# Patient Record
Sex: Female | Born: 1960 | Race: White | Hispanic: No | Smoking: Former smoker
Health system: Southern US, Community
[De-identification: ages and names within clinical notes are randomized; demographics above are authoritative.]

## PROBLEM LIST (undated history)

## (undated) DIAGNOSIS — N201 Calculus of ureter: Secondary | ICD-10-CM

## (undated) DIAGNOSIS — E785 Hyperlipidemia, unspecified: Secondary | ICD-10-CM

## (undated) DIAGNOSIS — Z8619 Personal history of other infectious and parasitic diseases: Secondary | ICD-10-CM

## (undated) DIAGNOSIS — Z8679 Personal history of other diseases of the circulatory system: Secondary | ICD-10-CM

## (undated) DIAGNOSIS — I10 Essential (primary) hypertension: Secondary | ICD-10-CM

## (undated) DIAGNOSIS — F329 Major depressive disorder, single episode, unspecified: Secondary | ICD-10-CM

## (undated) DIAGNOSIS — Z9889 Other specified postprocedural states: Secondary | ICD-10-CM

## (undated) DIAGNOSIS — R112 Nausea with vomiting, unspecified: Secondary | ICD-10-CM

## (undated) DIAGNOSIS — K5900 Constipation, unspecified: Secondary | ICD-10-CM

## (undated) DIAGNOSIS — Z8742 Personal history of other diseases of the female genital tract: Secondary | ICD-10-CM

## (undated) DIAGNOSIS — M5030 Other cervical disc degeneration, unspecified cervical region: Secondary | ICD-10-CM

## (undated) DIAGNOSIS — F419 Anxiety disorder, unspecified: Secondary | ICD-10-CM

## (undated) DIAGNOSIS — I609 Nontraumatic subarachnoid hemorrhage, unspecified: Secondary | ICD-10-CM

## (undated) DIAGNOSIS — M1990 Unspecified osteoarthritis, unspecified site: Secondary | ICD-10-CM

## (undated) DIAGNOSIS — K219 Gastro-esophageal reflux disease without esophagitis: Secondary | ICD-10-CM

## (undated) DIAGNOSIS — R7303 Prediabetes: Secondary | ICD-10-CM

## (undated) DIAGNOSIS — Z87442 Personal history of urinary calculi: Secondary | ICD-10-CM

## (undated) DIAGNOSIS — Z8249 Family history of ischemic heart disease and other diseases of the circulatory system: Secondary | ICD-10-CM

## (undated) DIAGNOSIS — M5020 Other cervical disc displacement, unspecified cervical region: Secondary | ICD-10-CM

## (undated) DIAGNOSIS — F32A Depression, unspecified: Secondary | ICD-10-CM

## (undated) HISTORY — DX: Depression, unspecified: F32.A

## (undated) HISTORY — PX: CARDIOVASCULAR STRESS TEST: SHX262

## (undated) HISTORY — DX: Hyperlipidemia, unspecified: E78.5

## (undated) HISTORY — PX: ORIF ANKLE FRACTURE: SUR919

## (undated) HISTORY — DX: Nontraumatic subarachnoid hemorrhage, unspecified: I60.9

## (undated) HISTORY — DX: Gastro-esophageal reflux disease without esophagitis: K21.9

## (undated) HISTORY — DX: Anxiety disorder, unspecified: F41.9

## (undated) HISTORY — PX: CARPAL TUNNEL RELEASE: SHX101

## (undated) HISTORY — DX: Personal history of other diseases of the circulatory system: Z86.79

## (undated) HISTORY — DX: Other cervical disc degeneration, unspecified cervical region: M50.30

## (undated) HISTORY — DX: Major depressive disorder, single episode, unspecified: F32.9

---

## 1987-05-11 HISTORY — PX: TUBAL LIGATION: SHX77

## 1997-05-10 HISTORY — PX: CHOLECYSTECTOMY: SHX55

## 1999-05-11 HISTORY — PX: OTHER SURGICAL HISTORY: SHX169

## 2002-05-22 ENCOUNTER — Inpatient Hospital Stay (HOSPITAL_COMMUNITY): Admission: RE | Admit: 2002-05-22 | Discharge: 2002-05-24 | Payer: Self-pay | Admitting: Gynecology

## 2002-05-22 ENCOUNTER — Encounter (INDEPENDENT_AMBULATORY_CARE_PROVIDER_SITE_OTHER): Payer: Self-pay | Admitting: Specialist

## 2002-05-22 HISTORY — PX: TOTAL ABDOMINAL HYSTERECTOMY: SHX209

## 2005-06-17 ENCOUNTER — Ambulatory Visit (HOSPITAL_COMMUNITY): Admission: RE | Admit: 2005-06-17 | Discharge: 2005-06-17 | Payer: Self-pay | Admitting: Family Medicine

## 2005-06-29 ENCOUNTER — Ambulatory Visit (HOSPITAL_COMMUNITY): Admission: RE | Admit: 2005-06-29 | Discharge: 2005-06-29 | Payer: Self-pay | Admitting: Family Medicine

## 2006-06-17 ENCOUNTER — Ambulatory Visit (HOSPITAL_COMMUNITY): Admission: RE | Admit: 2006-06-17 | Discharge: 2006-06-17 | Payer: Self-pay | Admitting: Family Medicine

## 2006-08-18 ENCOUNTER — Ambulatory Visit: Payer: Self-pay | Admitting: Internal Medicine

## 2006-08-24 ENCOUNTER — Ambulatory Visit (HOSPITAL_COMMUNITY): Admission: RE | Admit: 2006-08-24 | Discharge: 2006-08-24 | Payer: Self-pay | Admitting: Obstetrics & Gynecology

## 2006-08-24 ENCOUNTER — Encounter (INDEPENDENT_AMBULATORY_CARE_PROVIDER_SITE_OTHER): Payer: Self-pay | Admitting: *Deleted

## 2006-08-24 HISTORY — PX: LAPAROSCOPIC SALPINGO OOPHERECTOMY: SHX5927

## 2007-06-17 ENCOUNTER — Emergency Department (HOSPITAL_COMMUNITY): Admission: EM | Admit: 2007-06-17 | Discharge: 2007-06-17 | Payer: Self-pay | Admitting: Emergency Medicine

## 2008-01-27 ENCOUNTER — Emergency Department (HOSPITAL_COMMUNITY): Admission: EM | Admit: 2008-01-27 | Discharge: 2008-01-27 | Payer: Self-pay | Admitting: Emergency Medicine

## 2008-08-10 ENCOUNTER — Emergency Department (HOSPITAL_COMMUNITY): Admission: EM | Admit: 2008-08-10 | Discharge: 2008-08-10 | Payer: Self-pay | Admitting: Emergency Medicine

## 2008-09-06 ENCOUNTER — Ambulatory Visit (HOSPITAL_COMMUNITY)
Admission: RE | Admit: 2008-09-06 | Discharge: 2008-09-06 | Payer: Self-pay | Admitting: Physical Medicine and Rehabilitation

## 2008-09-17 ENCOUNTER — Ambulatory Visit (HOSPITAL_COMMUNITY): Admission: RE | Admit: 2008-09-17 | Discharge: 2008-09-17 | Payer: Self-pay | Admitting: Urology

## 2008-09-21 ENCOUNTER — Emergency Department (HOSPITAL_COMMUNITY): Admission: EM | Admit: 2008-09-21 | Discharge: 2008-09-22 | Payer: Self-pay | Admitting: Emergency Medicine

## 2009-04-12 ENCOUNTER — Emergency Department (HOSPITAL_COMMUNITY): Admission: EM | Admit: 2009-04-12 | Discharge: 2009-04-12 | Payer: Self-pay | Admitting: Emergency Medicine

## 2009-08-15 ENCOUNTER — Ambulatory Visit (HOSPITAL_COMMUNITY): Admission: RE | Admit: 2009-08-15 | Discharge: 2009-08-15 | Payer: Self-pay | Admitting: Family Medicine

## 2010-03-15 ENCOUNTER — Emergency Department (HOSPITAL_COMMUNITY)
Admission: EM | Admit: 2010-03-15 | Discharge: 2010-03-15 | Payer: Self-pay | Source: Home / Self Care | Admitting: Emergency Medicine

## 2010-03-30 ENCOUNTER — Encounter: Payer: Self-pay | Admitting: Orthopedic Surgery

## 2010-06-09 NOTE — Letter (Signed)
Summary: [facility] Ref[facility] No Show  [facility]  [address]  [place], [place] [address]   Phone: [phone]  Fax: [phone]     March 30, 2010  [facility] Dr. Hardie Lora, [facility] Fax:  [phone]   Re:    Katheline L LAWS DOB:    06-08-1960    The above named patient was a no show for the scheduled appointment:  Date:  03/25/10 Time:   3:00pm  Thank you for your kind referral.  Please feel free to contact our office if we can be of further service.  Sincerely,   [facility] Office of Dr. Terrance Mass, MD

## 2010-08-11 LAB — URINALYSIS, ROUTINE W REFLEX MICROSCOPIC
Bilirubin Urine: NEGATIVE
Glucose, UA: NEGATIVE mg/dL
Hgb urine dipstick: NEGATIVE
Ketones, ur: NEGATIVE mg/dL
Nitrite: NEGATIVE
Protein, ur: NEGATIVE mg/dL
Specific Gravity, Urine: 1.01 (ref 1.005–1.030)
Urobilinogen, UA: 0.2 mg/dL (ref 0.0–1.0)
pH: 7.5 (ref 5.0–8.0)

## 2010-08-11 LAB — WET PREP, GENITAL: Yeast Wet Prep HPF POC: NONE SEEN

## 2010-08-11 LAB — URINE MICROSCOPIC-ADD ON

## 2010-08-11 LAB — GC/CHLAMYDIA PROBE AMP, GENITAL
Chlamydia, DNA Probe: NEGATIVE
GC Probe Amp, Genital: NEGATIVE

## 2010-08-19 LAB — URINALYSIS, ROUTINE W REFLEX MICROSCOPIC
Bilirubin Urine: NEGATIVE
Glucose, UA: NEGATIVE mg/dL
Hgb urine dipstick: NEGATIVE
Ketones, ur: NEGATIVE mg/dL
Nitrite: NEGATIVE
Protein, ur: NEGATIVE mg/dL
Specific Gravity, Urine: 1.02 (ref 1.005–1.030)
Urobilinogen, UA: 0.2 mg/dL (ref 0.0–1.0)
pH: 7 (ref 5.0–8.0)

## 2010-08-19 LAB — URINE MICROSCOPIC-ADD ON

## 2010-09-25 NOTE — Op Note (Signed)
NAME:  Sabrina Beasley, Sabrina Beasley                             ACCOUNT NO.:  [id number]   MEDICAL RECORD NO.:  [id number]                   PATIENT TYPE:  INP   LOCATION:  9399                                 FACILITY:  [facility]   PHYSICIAN:  Ivor Costa. Farrel Gobble, M.D.              DATE OF BIRTH:  December 21, 1960   DATE OF PROCEDURE:  05/22/2002  DATE OF DISCHARGE:                                 OPERATIVE REPORT   PREOPERATIVE DIAGNOSES:  1. Chronic pelvic pain.  2. Complex adnexal mass.  3. History of endometriosis.  4. Dysfunctional bleeding.   POSTOPERATIVE DIAGNOSES:  1. Chronic pelvic pain.  2. Complex adnexal mass.  3. History of endometriosis.  4. Dysfunctional bleeding.   PROCEDURE:  1. Total abdominal hysterectomy.  2. Left salpingo oophorectomy.   SURGEON:  Ivor Costa. Farrel Gobble, M.D.   ASSISTANT:  Gaetano Hawthorne. Lily Peer, M.D.   ANESTHESIA:  Epidural with IV sedation.   IV FLUIDS:  1600 cubic centimeters of lactated Ringer's.   URINE OUTPUT:  125 cubic centimeters clear.   ESTIMATED BLOOD LOSS:  100 cubic centimeters.   FINDINGS:  Left ovarian mass that was mobile.  Normal uterus, tubes, and  right ovary.  No evidence of peritoneal endometriosis grossly.   PATHOLOGY:  Uterus, cervix, and left tube and ovary.   COMPLICATIONS:  None.   PROCEDURE:  The patient was taken to the operating room.  Epidural  anesthesia was induced.  Placed in the supine position.  Prepped and draped  in the usual sterile fashion.  After adequate anesthesia was insured, a  Pfannenstiel skin incision was made with a scalpel and carried through to  the underlying layer of fascia with electrocautery.  The fascia was scored  in the midline.  The incision was extended laterally with Mayo scissors.  The superior aspect of the fascial incision was grasped with Kochers.  The  underlying rectus muscles were dissected off by blunt and sharp dissection.  In a similar fashion the inferior aspect of the incision was grasped  with  Kochers.  The underlying rectus muscles were dissected off.  The rectus  muscles were separated in the midline.  The peritoneum was identified and  entered bluntly.  The peritoneal incision was then extended superiorly and  inferiorly with good visualization of underlying bowel and bladder.  The  orientation of the uterus confirmed was confirmed.  The adnexa was noted to  be mobile.  An O'Connor-O-Sullivan retractor was placed in the incision.  The bowel was packed away with moist lap packs x2.  The adnexa were grasped  with Heaney clamps.  The left round ligament was transected and suture  ligated with 0 Vicryl.  The anterior leaf of the broad ligament was incised  to the midline to create a bladder flap.  The posterior leaf was also  incised.  The IP was identified.  The posterior leaf was bluntly  entered.  The IP was then transected and suture ligated after a free tie of 0 Vicryl  was placed.  The adnexa was passed off the table.  The uterine vessels were  then skeletonized, transected, and suture ligated x2 with 0 Vicryl.  Attention was then turned to the right side.  In a similar fashion the round  ligament was grasped, transected, and suture ligated with 0 Vicryl.  The  anterior leaf of the broad ligament was incised.  The bladder flap was noted  to fall naturally.  The posterior leaf was also incised.  The tubo-ovarian  ligament then on the left was transected and suture ligated with a free tie  of 0 Vicryl and suture ligature.  Similarly, the vessels were skeletonized,  transected, and suture ligated.  The dissection was carried to the cardinal  ligaments, again with 0 Vicryl, down to the cervix.  The vagina was entered  sharply and the whole specimen was passed off sharply.  The adnexa were then  transfixed to the ipsilateral cardinal ligament, inverting the mucosa.  The  remainder of the cuff was then closed with a baseball stitch of 0 Vicryl.  The pelvis was then irrigated  with copious amounts of warm saline.  Hemostasis was assured.  The tubo-ovarian ligament on the right was  transfixed to the round ligament and were already elevated out of the cul-de-  sac.  The small rent in the peritoneum was then closed with 3-0 Vicryl.  The  sponges and retractor were then removed from the incision.  Inspection of  the peritoneum muscles were noted to be hemostatic.  The fascia was closed  with 0 Vicryl starting at the apex heading towards the midline bilaterally.  The subcutaneous was irrigated.  The skin was closed with staples.  Sponge,  lap, needle counts were correct x2.  She was transferred to the PACU in  stable condition.  She received Ancef intraoperatively.                                               Ivor Costa. Farrel Gobble, M.D.    THL/MEDQ  D:  05/22/2002  T:  05/22/2002  Job:  [id number]

## 2010-09-25 NOTE — H&P (Signed)
NAME:  Sabrina Beasley, Sabrina Beasley                   ACCOUNT NO.:  [id number]   MEDICAL RECORD NO.:  [id number]          PATIENT TYPE:  AMB   LOCATION:  DAY                           FACILITY:  [facility]   PHYSICIAN:  R. Roetta Sessions, M.D. DATE OF BIRTH:  1961/02/20   DATE OF ADMISSION:  DATE OF DISCHARGE:  LH                              HISTORY & PHYSICAL   REASON FOR CONSULT:  Constipation, hematochezia.   HISTORY OF PRESENT ILLNESS:  The patient is a pleasant 50 year old  Caucasian female sent over at the courtesy of Dr. Nobie Putnam to further  evaluate constipation.  The patient has been constipated for a 2 plus  years.  She may go 3 to 4 days without a bowel movement.  He is taking  Dulcolax tablets and MiraLax, other over-the-counter agents.  She is  struggling to have a bowel movement during this time.  She may go 3 to 4  days without a bowel movement, and just have vague abdominal bloating  along the way.   Just recently she was started on Amitiza 24 mcg b.i.d., and this was  associated with severe abdominal cramps and diarrhea.  She had been  taking one tablet daily with food, it was too much, and it produced  diarrhea and some incontinent episodes.  She has not lost any weight.  She tells me that she has had her thyroid status check within the past 2  years, and it was okay.  There is no family history of colorectal  neoplasia.  This lady does relate to having intermittent episodes of  bright red blood per rectum.  She has had some right lower quadrant  abdominal pain and was found to have a complex hemorrhagic cyst of the  right ovary, it has been recurrent, and she is actually slated to have a  laparoscopic right oophorectomy by Dr. Despina Hidden next week.   This lady has a history of heartburn, for which she takes omeprazole 20  mg orally daily.  This has been effective in squelching her reflux  symptoms.   PAST MEDICAL HISTORY:  Past medical history is significant for  hypercholesterolemia.  She  is status post hysterectomy and left over  rectum he previously, with history of recurrent right ovarian cysts.   CURRENT MEDICATIONS:  1. Omeprazole 20 mg orally daily.  2. Zocor 10 mg daily.  3. Voltaren 75 mg b.i.d.  4. Multivitamin daily.  5. Paxil 40 mg daily.  6. Trazodone 50 mg daily.  7. All-Bran fiber 10 mg daily.   ALLERGIES:  SULFA.   FAMILY HISTORY:  Mother is alive with hypertension,  hypercholesterolemia, and reflux.  The patient had succumbed at the age  of 67 with complications related to emphysema and pneumonia.  No history  of chronic GI or liver disease in the family otherwise.   SOCIAL HISTORY:  The patient is married and has 3 children.  [place]  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  No tobacco.  No alcohol.  She has no chest  pain,  no dyspnea on exertion, no change in weight, no odynophagia, no  dysphagia, no nausea or vomiting.  No fever or chills.   PHYSICAL EXAMINATION:  GENERAL:  Reveals a pleasant 50 year old lady  resting comfortably.  VITAL SIGNS:  Weight 157, height 4 feet 11 inches, temperature 98.5, BP  110/80, pulse 76.  SKIN:  Warm and dry.  There is no jaundice or evidence of any chronic  liver disease.  HEENT EXAM:  No scleral icterus.  Conjunctivae are pink.  The oral  cavity had no lesions.  CHEST:  Lungs are clear to auscultation.  CARDIAC EXAM:  Regular rate and rhythm, without murmur, gallop, or rub.  ABDOMEN:  Nondistended.  Positive bowel sounds.  Soft, nontender.  No  appreciable mass or organomegaly.  RECTAL EXAM:  Deferred until time of colonoscopy.   LABORATORY DATA:  Laboratory data done back in 01/2006, she had a  slightly elevated white count of 10.6, H and H of 12.2 and 36.8.  Serum  iron 73.  Iron-binding capacity 383, with iron saturations at point they  were normal at 19.  B12 level is 609, folate level greater than 20.   IMPRESSION:  The patient is a very pleasant  50 year old lady with  chronic constipation, it sounds more like colonic inertia than a  functional outlet obstruction.  She does have intermittent hematochezia,  which will demand further evaluation.  He has been on a number of agents  to combat constipation.  More recently, it sounds like the Amitiza was  effective and, in fact, use of this agent actually overshot our end  point, producing incontinence and diarrhea.  Unfortunately, the 24-mcg  gelcap is the only form in which Amitiza comes.  The dosage cannot be  reduced on the patient end of things.   RECOMMENDATIONS:  1. This patient needs to have a colonoscopy.  I feel it would be okay      for her to wait until after she has her right ovary resected.  In      the meantime, we will go ahead and try some CrystaLase 20 grams      orally at bedtime, samples given.  If this happens to produced      diarrhea, I have asked the patient to cut back to 10 grams orally      at bedtime.  She is to continue a fiber supplement.  2. Literature on constipation was provided to the patient.  3. Further recommendations to follow.      Jonathon Bellows, M.D.  Electronically Signed     RMR/MEDQ  D:  08/18/2006  T:  08/18/2006  Job:  [id number]   cc:   Lazaro Arms, M.D.  Fax: [phone]   Patrica Duel, M.D.  Fax: [phone]

## 2010-09-25 NOTE — Discharge Summary (Signed)
   NAME:  Sabrina Beasley, Sabrina Beasley                             ACCOUNT NO.:  [id number]   MEDICAL RECORD NO.:  [id number]                   PATIENT TYPE:  INP   LOCATION:  9314                                 FACILITY:  [facility]   PHYSICIAN:  Ivor Costa. Farrel Gobble, M.D.              DATE OF BIRTH:  25-May-1960   DATE OF ADMISSION:  05/22/2002  DATE OF DISCHARGE:  05/24/2002                                 DISCHARGE SUMMARY   DISCHARGE DIAGNOSES:  1. Pelvic pain.  2. Complex adnexal mass.   PREOPERATIVE DIAGNOSIS:  1. History of endometriosis.  2. Dysfunctional uterine bleeding.   POSTOPERATIVE DIAGNOSES:  1. Total abdominal hysterectomy.  2. Left salpingo-oophorectomy with Ivor Costa. Farrel Gobble, M.D. on May 22, 2002.  3. Left ovarian mucinous cyst adenoma.   HISTORY:  This is a 50 year old female gravida 2, para 3, with history of  endometriosis diagnosed by laparoscopy in 2001.  Also, D&C done at that time  for menorrhagia.  The patient had low back pain on a regular basis,  exacerbation of her symptoms at the time of her period.  Periods were every  2-4 weeks with 3-4 days of heavy flow which she _________ every two hours  for three days with large amount of clotting, severe dysmenorrhea.  She had  on ultrasound echogenic cyst measuring  5.6 x 4.6 x 4.7 cm and patient desired definitive therapy.   HOSPITAL COURSE:  On May 22, 2002, the patient was admitted to undergo a  total abdominal hysterectomy, left salpingo-oophorectomy with Ivor Costa.  Lathrop, M.D., left ovarian mass that was mobile and removed.  The pathology  revealed it to be an ovarian mucinous cyst adenoma.  Postoperatively on  May 22, 2002, the patient did have temperature of 101.1 at 2030.  After  this, the patient was afebrile, doing well, desired to go home on May 24, 2002, and was felt in satisfactory condition so she was discharged to  home.  __________ labs on May 23, 2002, hemoglobin 10.2.   DISPOSITION:   The patient discharged to home.  She is to follow up in the  office in two weeks.  If any problems prior to that time, to be seen in the  office.     Susa Loffler, P.A.                    Ivor Costa. Farrel Gobble, M.D.    TSG/MEDQ  D:  08/10/2002  T:  08/11/2002  Job:  [id number]

## 2010-09-25 NOTE — H&P (Signed)
NAME:  LAWS, Sabrina Beasley                   ACCOUNT NO.:  [id number]   MEDICAL RECORD NO.:  [id number]          PATIENT TYPE:  AMB   LOCATION:                                FACILITY:  [facility]   PHYSICIAN:  Lazaro Arms, M.D.   DATE OF BIRTH:  [birth year]   DATE OF ADMISSION:  DATE OF DISCHARGE:  LH                              HISTORY & PHYSICAL   Sabrina Beasley is 50 year old white female status post a hysterectomy and left  salpingo-oophorectomy in the past who has been having on again and off  again pain in the right lower quadrant for about 2 years.  She saw  [facility] and got an ultrasound [facility] at [facility]  which revealed a 3 cm hemorrhagic corpus luteum cyst.  The patient  understands that this is a hormonal cyst, but states that she has been  having on again and off again pain for 2 years and wants it finally  dealt with.  I offered her repeat ultrasound in the future, and she  wanted definitive management.  As a result, she will have a laparoscopic  RSO on the 16th.   PAST MEDICAL HISTORY:  1. Hypercholesterolemia.  2. Gastroesophageal reflux.  3. Osteoarthritis.  4. Depression.   PAST SURGICAL HISTORY:  1. She had a D&C in 1999.  2. Hysterectomy and left salpingo-oophorectomy in 2004.   REVIEW OF SYSTEMS:  As per HPI, otherwise negative.   PAST OBSTETRICAL HISTORY:  Three vaginal deliveries.   ALLERGIES:  SULFA DRUGS.   MEDICATIONS:  Omeprazole 20 mg a day, Zocor 10 mg a day, diclofenac 75  mg a day, Paxil 40 mg a day, trazodone 150 mg a day.   VITAL SIGNS:  Weight is 156 pounds, blood pressure is 120/88.  HEENT:  Unremarkable.  NECK:  Thyroid is normal.  LUNGS:  Clear.  HEART:  Regular rate and rhythm.  No murmurs, rubs or gallops.  BREASTS:  Without mass, discharge, skin changes.  ABDOMEN:  Benign.  No hepatosplenomegaly or masses.  Tender right lower  quadrant.  No rebound.  PELVIC:  Negative.  So much central obesity to really feel the right  ovary.  EXTREMITIES:  Normal with no edema.  NEUROLOGIC:  Neurological exam is grossly intact.   IMPRESSION:  1. Right lower quadrant pain for 2 years.  2. Right hemorrhagic corpus luteum cyst.   PLAN:  Patient understands that this may or may not be related to her  pain at all times.  She understands that, while in fact she may have a  corpus luteum, this may not explain her pain.  She understands and wants  to proceed with laparoscopic RSO.  She understands the risks, benefits,  indication, alternatives.  We will proceed.      Lazaro Arms, M.D.  Electronically Signed     LHE/MEDQ  D:  08/18/2006  T:  08/18/2006  Job:  [id number]

## 2010-09-25 NOTE — Op Note (Signed)
NAME:  Sabrina Beasley, Sabrina Beasley                   ACCOUNT NO.:  [id number]   MEDICAL RECORD NO.:  [id number]          PATIENT TYPE:  AMB   LOCATION:  DAY                           FACILITY:  [facility]   PHYSICIAN:  Lazaro Arms, M.D.   DATE OF BIRTH:  May 11, 1960   DATE OF PROCEDURE:  08/24/2006  DATE OF DISCHARGE:                               OPERATIVE REPORT   PREOPERATIVE DIAGNOSES:  1. Right ovarian cyst, persistent.  2. Right lower quadrant pain.   POSTOPERATIVE DIAGNOSES:  1. Right ovarian cyst, persistent.  2. Right lower quadrant pain.   PROCEDURE:  Laparoscopic right salpingo-oophorectomy.   SURGEON:  Lazaro Arms, M.D.   ANESTHESIA:  General.   FINDINGS:  The patient had enlarged right tube and ovary.  It was kind  of just in a big mass adherent to the right pelvic sidewall.  Certainly  was benign.  Pelvis was otherwise normal.   DESCRIPTION OF OPERATION:  The patient was taken to the operating room  and placed in the supine position where she underwent general  endotracheal anesthesia, placed in dorsal lithotomy position, prepped  and draped in the usual sterile fashion.  Incision was made in the  umbilicus and an open laparoscopy was performed.  The fascia was incised  directly and the peritoneum was entered manually.  The sleeve of the  trocar was placed with video laparoscope.  It was insufflated.  5 mm  trocars were placed two fingerbreadths above the pubis in the midline  and then also the right lower quadrant under direct visualization  without difficulty.  The ovary was grasped.  Harmonic scalpel was used.  Infundibulopelvic ligament was cauterized and the ovary was carefully  dissected off of the wall using the harmonic scalpel.  There was really  no bleeding at all during the procedure.  The ovary was removed in the  EndoCatch bag.  The pelvis was irrigated vigorously.  All pedicles were  hemostatic.  The instruments were removed.  Gas allowed to escape.  The  umbilical  fascia was closed and the skin was closed using skin staples  x3.  The patient tolerated the procedure well.  She experienced minimal  blood loss, taken to the recovery room in good stable condition.  All  counts correct.     Lazaro Arms, M.D.  Electronically Signed    LHE/MEDQ  D:  08/24/2006  T:  08/24/2006  Job:  [id number]

## 2010-09-25 NOTE — H&P (Signed)
NAME:  Sabrina Beasley, Sabrina Beasley                             ACCOUNT NO.:  [id number]   MEDICAL RECORD NO.:  [id number]                   PATIENT TYPE:  INP   LOCATION:  NA                                   [facility]ACILITY[facility]   PHYSICIAN:  Ivor Costa. Farrel Gobble, M.D.              DATE OF BIRTH:  05-19-1960   DATE OF ADMISSION:  DATE OF DISCHARGE:                                HISTORY & PHYSICAL   CHIEF COMPLAINT:  Menorrhagia, dyspareunia, suspect endometriosis.   HISTORY OF PRESENT ILLNESS:  The patient is a 50 year old, G3, P3, with a  history of endometriosis diagnosed by laparoscopy in 2001.  She also had a  D&C done at that time for menorrhagia.  The patient reports having low back  pain on a daily basis with exacerbation of her symptoms at the time of her  period.  She reports that her periods are every 3-4 weeks with 3-4 days of  heavy flow and which she changes tampon approximately every two hours for  three days with large amounts of clots.  She has severe dysmenorrhea for  which she takes Motrin every 4 hours and Midol on a regular basis for three  days.  She also reports dyspareunia which affects her relationship with her  husband.  She has had these symptoms for approximately four years.  She had  no improvement after going to the OR.  She was placed on Lupron but failed  to follow up secondary to insurance change.  The patient presented then back  to the office for an ultrasound with a sonohysterogram.  Her left adnexa  showed an echogenic cyst that measured 5.6 x 4.6 x 4.7 mm.  Her  sonohysterogram was unremarkable.   PAST OBSTETRICAL/GYNECOLOGIC HISTORY:  Significant for three spontaneous  deliveries.  Her menses are as above.   PAST MEDICAL HISTORY:  Negative.   PAST SURGICAL HISTORY:  Significant for the laparoscopy in 2001.  She also  had a bilateral tubal ligation in 1989, and she had treatment of gallstones  in 1999.   MEDICATIONS:  Prozac and danazol which she had  discontinued.   ALLERGIES:  SULFA which causes nausea and vomiting.   SOCIAL HISTORY:  She is married, denies any alcohol, tobacco, or caffeine.  She walks on a regular basis.   PHYSICAL EXAMINATION:  GENERAL:  She is a well-appearing female in no acute  distress.  HEENT:  Unremarkable.  Thyroid is smooth, nontender, and mobile.  HEART:  Regular rate.  LUNGS:  Clear to auscultation.  BREASTS:  Without mass, discharge, or retractions supine and upright  position.  ABDOMEN:  Soft and nontender.  GYN:  She has normal external female genitalia.  The BUS is negative.  The  vagina is pink and moist.  Her cervix is parous without lesions.  BIMANUAL:  There is minimal cervical motion tenderness.  The uterus is  tender  anteriorly.  The adnexa are negative.  The rectovaginal exam does not  show any nodularity or tenderness of the uterosacral ligaments.  There is  minimal descent in the upright position.   ASSESSMENT:  History of endometriosis by prior laparoscope with a complex  adnexal mass and severe dysmenorrhea as well as dyspareunia.  The patient  presents for a total abdominal hysterectomy, possible bilateral salpingo-  oophorectomy.  If the adnexa are normal, i.e., the cyst is a hemorrhagic  cyst, we will consider keeping the adnexa.  If there is severe  endometriosis, she would prefer to have both ovaries removed, even the  normal-appearing right ovary.  All questions were addressed.                                               Ivor Costa. Farrel Gobble, M.D.    THL/MEDQ  D:  05/21/2002  T:  05/21/2002  Job:  [id number]

## 2010-11-03 ENCOUNTER — Encounter: Payer: Self-pay | Admitting: Vascular Surgery

## 2010-11-04 NOTE — Progress Notes (Signed)
[facility] [place]  New Carotid Patient  Referred by: Dr. Aldean Baker  Reason for referral: R wrist insufficiency  History of Present Illness  Sabrina Beasley is a 50 y.o. female who presents with chief complaint: B hand numbness.  No traumatic history.  She denies repetitive stress trauma from her occupation.  Her distribution of his sx is c/w ulnar distribution.  She denies any pain per se and denies any neck or shoulder sx.  She is not clear if she has already undergone BUE EMG/NCV though the chart suggests she has undergone such.  She has undergone chest xray and MRI.  Past Medical History  Diagnosis Date  . Diabetes mellitus   . Gout   . GERD (gastroesophageal reflux disease)   . Anxiety   . Arthritis   . Cataract   . Depression   . Hepatitis C   . Osteoporosis   . History of rheumatic fever   . DDD (degenerative disc disease), cervical   . Hyperlipidemia     Past Surgical History  Procedure Date  . Cholecystectomy   . Abdominal hysterectomy   . Carpal tunnel release     bilateral    History   Social History  . Marital Status: Married    Spouse Name: N/A    Number of Children: N/A  . Years of Education: N/A   Occupational History  . Not on file.   Social History Main Topics  . Smoking status: Never Smoker   . Smokeless tobacco: Not on file  . Alcohol Use:   . Drug Use:   . Sexually Active:    Other Topics Concern  . Not on file   Social History Narrative  . No narrative on file    Family History  Problem Relation Age of Onset  . Hypertension Mother   . Hyperlipidemia Mother   . Heart disease Father     Current outpatient prescriptions:allopurinol (ZYLOPRIM) 300 MG tablet, Take 300 mg by mouth daily.  , Disp: , Rfl: ;  Cholecalciferol (VITAMIN D PO), Take 5,000 Units by mouth once a week.  , Disp: , Rfl: ;  citalopram (CELEXA) 40 MG tablet, Take 40 mg by mouth daily.  , Disp: , Rfl: ;  fenofibrate 160 MG tablet, Take 160 mg by  mouth daily.  , Disp: , Rfl:  metFORMIN (GLUCOPHAGE) 500 MG tablet, Take 500 mg by mouth 2 (two) times daily with a meal.  , Disp: , Rfl: ;  metoprolol tartrate (LOPRESSOR) 25 MG tablet, Take 25 mg by mouth daily.  , Disp: , Rfl: ;  simvastatin (ZOCOR) 40 MG tablet, Take 40 mg by mouth at bedtime.  , Disp: , Rfl: ;  traZODone (DESYREL) 150 MG tablet, Take 150 mg by mouth at bedtime.  , Disp: , Rfl:   Allergies as of 11/06/2010 - never reviewed  Allergen Reaction Noted  . Macrobid  11/03/2010    Review of Systems (Positive items in bold and italic, otherwise negative)  General: Weight loss, Weight gain, Loss of appetite, Fever  Neurologic: Dizziness, Blackouts, Headaches, Seizure  Ear/Nose/Throat: Change in eyesight, Change in hearing, Nose bleeds, Sore throat  Vascular: Pain in legs with walking, Pain in feet while lying flat, Non-healing ulcer, Stroke, "Mini stroke", Slurred speech, Temporary blindness, Blood clot in vein, Phlebitis  Pulmonary: Home oxygen, Productive cough, Bronchitis, Coughing up blood, Asthma, Wheezing  Musculoskeletal: Arthritis, Joint pain, Muscle pain  Cardiac: Chest pain, Chest tightness/pressure, Shortness of breath  when lying flat, Palpitations, Heart murmur, Arrythmia, Atrial fibrillation  Hematologic: Bleeding problems, Clotting disorder, Anemia  Psychiatric:  Depression, Anxiety, Attention deficit disorder  Gastrointestinal:  Black stool, Blood in stool, Peptic ulcer disease, Reflux, Hiatal hernia, Trouble swallowing, Diarrhea, Constipation  Urinary:  Kidney disease, Burning with urination, Frequent urination, Difficulty urinating  Skin: Ulcers, Rashes  Physical Examination  Filed Vitals:   11/06/10 1137  BP: 102/69  Pulse: 86  Resp: 16    General: A&O x 3, WDWN**,* Cachectic / Ill appear / Somulent  Head: [place]/AT  Ear/Nose/Throat: Hearing grossly intact, nares w/o erythema or drainage, oropharynx w/o Erythema/Exudate  Eyes: PERRLA,  EOMI  Neck: Supple, no nuchal rigidity, no palpable LAD, no TTP  Pulmonary: Sym exp, good air movt, CTAB, no rales, rhonchi, & wheezing, no bruits over clavicles  Cardiac: RRR, Nl S1, S2, no Murmurs, rubs or gallops  Vascular: Vessel Right Left  Radial Palpable Palpable  Ulnary Palpable Palpable  Brachial Palpable Palpable  Carotid Palpable, without bruit Palpable, without bruit  Aorta Non-palpable N/A  Femoral Palpable Palpable  Popliteal Non-palpable Non-palpable  PT Palpable Palpable  DP Palpable Palpable   Gastrointestinal: soft, NTND, -G/R, - HSM, - masses, - CVAT B, mildly obese  Musculoskeletal: M/S 5/5 throughout, Extremities without ischemic changes  Neurologic: CN 2-12 intact, Pain and light touch intact in extremities, Motor exam as listed above, + EAST  Psychiatric: Judgment intact, Mood & affect appropriate for pt's clinical situation  Dermatologic: See M/S exam for extremity exam, no rashes otherwise noted  Lymph : No Cervical, Axillary, or Inguinal lymphadenopathy   Non-Invasive Vascular Imaging  BUE Arterial Duplex (Date: 11/06/10):   RUE: WBI 1.25, triphasic waveforms throughout  LUE: WBI 1.21, triphasic waveforms throughout  Outside Studies/Documentation 5 pages of outside documents were reviewed.  Medical Decision Making  Sabrina Beasley is a 50 y.o. female who presents with: BUE numbness.  Her work up so appears to be non-diagnostic including: CXR to r/o cervical ribs, MRI C spine to evaluate HNP causing cervical compression, B carpal tunnel release, and neurologic studies including nerve conduction studies.   I will need to obtain the NCV and Neuro reports to review them to make any comments on them.   The CXR does not look particularly suspicious for cervical ribs or aberrant ribs or osteophytes  I would recommend 3-6 months of PT for possible neurogenic thoracic outlet syndrome as patient is now nearing that diagnosis by diagnosis by  exclusion  I briefly discussed with the patient the nature of nTOS and the difficulties with diagnosis and management.  I do not performed 1st rib decompression so if she does not respond to PT, I will refer her to Dr. Denyse Dago at [facility] for evaluation.  Thank you for allowing [place] to participate in this patient's care.  Leonides Sake, MD [facility] [place] Office: [phone] Pager: [phone]

## 2010-11-06 ENCOUNTER — Ambulatory Visit (INDEPENDENT_AMBULATORY_CARE_PROVIDER_SITE_OTHER): Payer: BC Managed Care – PPO | Admitting: Vascular Surgery

## 2010-11-06 ENCOUNTER — Encounter (INDEPENDENT_AMBULATORY_CARE_PROVIDER_SITE_OTHER): Payer: BC Managed Care – PPO

## 2010-11-06 ENCOUNTER — Encounter: Payer: Self-pay | Admitting: Vascular Surgery

## 2010-11-06 VITALS — BP 102/69 | HR 86 | Resp 16

## 2010-11-06 DIAGNOSIS — R209 Unspecified disturbances of skin sensation: Secondary | ICD-10-CM

## 2010-11-06 DIAGNOSIS — M79609 Pain in unspecified limb: Secondary | ICD-10-CM

## 2010-11-06 DIAGNOSIS — R200 Anesthesia of skin: Secondary | ICD-10-CM | POA: Insufficient documentation

## 2010-11-06 NOTE — Progress Notes (Signed)
Eval for Bilat. Hand numbness and tingling. No injury. Pt has had 2 Carpal tunnel releases on right wrist.   Ref---> Dr. Lajoyce Corners

## 2011-01-29 LAB — WET PREP, GENITAL
Trich, Wet Prep: NONE SEEN
Yeast Wet Prep HPF POC: NONE SEEN

## 2011-01-29 LAB — GC/CHLAMYDIA PROBE AMP, GENITAL
Chlamydia, DNA Probe: NEGATIVE
GC Probe Amp, Genital: NEGATIVE

## 2011-02-08 LAB — WET PREP, GENITAL
Clue Cells Wet Prep HPF POC: NONE SEEN
Trich, Wet Prep: NONE SEEN
Yeast Wet Prep HPF POC: NONE SEEN

## 2011-02-08 LAB — URINALYSIS, ROUTINE W REFLEX MICROSCOPIC
Bilirubin Urine: NEGATIVE
Glucose, UA: NEGATIVE
Hgb urine dipstick: NEGATIVE
Ketones, ur: NEGATIVE
Nitrite: NEGATIVE
Protein, ur: NEGATIVE
Specific Gravity, Urine: <1.005 — ABNORMAL LOW
Urobilinogen, UA: 0.2
pH: 6

## 2011-02-08 LAB — URINE MICROSCOPIC-ADD ON

## 2011-02-08 LAB — GC/CHLAMYDIA PROBE AMP, GENITAL
Chlamydia, DNA Probe: NEGATIVE
GC Probe Amp, Genital: NEGATIVE

## 2012-01-09 HISTORY — PX: COLONOSCOPY: SHX174

## 2012-05-10 HISTORY — PX: ESOPHAGOGASTRODUODENOSCOPY: SHX1529

## 2012-11-20 ENCOUNTER — Other Ambulatory Visit (HOSPITAL_COMMUNITY): Payer: Self-pay | Admitting: Family Medicine

## 2012-11-20 DIAGNOSIS — Z0000 Encounter for general adult medical examination without abnormal findings: Secondary | ICD-10-CM

## 2012-11-23 ENCOUNTER — Ambulatory Visit (HOSPITAL_COMMUNITY): Payer: BC Managed Care – PPO

## 2012-11-29 ENCOUNTER — Ambulatory Visit (HOSPITAL_COMMUNITY)
Admission: RE | Admit: 2012-11-29 | Discharge: 2012-11-29 | Disposition: A | Discharge status: Home / Self Care | Payer: Medicare Other | Source: Ambulatory Visit | Attending: Family Medicine | Admitting: Family Medicine

## 2012-11-29 DIAGNOSIS — Z780 Asymptomatic menopausal state: Secondary | ICD-10-CM | POA: Insufficient documentation

## 2012-11-29 DIAGNOSIS — Z0000 Encounter for general adult medical examination without abnormal findings: Secondary | ICD-10-CM

## 2012-11-29 DIAGNOSIS — M899 Disorder of bone, unspecified: Secondary | ICD-10-CM | POA: Insufficient documentation

## 2013-02-14 ENCOUNTER — Encounter: Payer: Self-pay | Admitting: Orthopedic Surgery

## 2013-02-14 ENCOUNTER — Ambulatory Visit (INDEPENDENT_AMBULATORY_CARE_PROVIDER_SITE_OTHER): Payer: Medicare Other | Admitting: Orthopedic Surgery

## 2013-02-14 VITALS — BP 133/81 | Ht 59.0 in | Wt 144.0 lb

## 2013-02-14 DIAGNOSIS — G5600 Carpal tunnel syndrome, unspecified upper limb: Secondary | ICD-10-CM | POA: Insufficient documentation

## 2013-02-14 DIAGNOSIS — G5602 Carpal tunnel syndrome, left upper limb: Secondary | ICD-10-CM

## 2013-02-14 NOTE — Patient Instructions (Signed)
Surgery left carpal tunnel release CPT 64721 02/23/13

## 2013-02-14 NOTE — Progress Notes (Signed)
Patient ID: Sabrina Beasley, female   DOB: [birth year], 52 y.o.   MRN: [id number]  Chief Complaint  Patient presents with  . Wrist Pain    Left hand pain with numbness and tingling in fingers    HISTORY: 52 year old female presents status post right carpal tunnel release and a repeat surgery for continued symptoms with no resolution. Presents now with left hand numbness tingling inability to sleep at night weakness characteristic fine motor activity difficulties despite wearing braces and taking oral medication.  She notes that her hands feel heavy her pain is a 10 she denies neck pain she reports pain running distal to proximal. Her symptoms are worse with use  Review of systems: She reports positive review of symptoms as constipation nervousness anxiety depression otherwise she reported all other systems is negative  Past Medical History  Diagnosis Date  . Diabetes mellitus   . Gout   . GERD (gastroesophageal reflux disease)   . Anxiety   . Arthritis   . Depression   . Hepatitis C   . Osteoporosis   . History of rheumatic fever   . DDD (degenerative disc disease), cervical   . Hyperlipidemia   . Carpal tunnel syndrome on both sides     Past Surgical History  Procedure Laterality Date  . Abdominal hysterectomy    . Carpal tunnel release      bilateral  . Carpal tunnel release      right wrist X2  . Cholecystectomy      Family History  Problem Relation Age of Onset  . Hypertension Mother   . Hyperlipidemia Mother   . Heart disease Father    History  Substance Use Topics  . Smoking status: Former Smoker -- 0.50 packs/day for 10 years    Types: Cigarettes    Quit date: 08/09/2010  . Smokeless tobacco: Not on file  . Alcohol Use: No     Blood pressure 133/81, height 4\' 11"  (1.499 m), weight 144 lb (65.318 kg).  General appearance is normal, the patient is alert and oriented x3 with normal mood and affect. Ambulation is normal  Inspection reveals that her right wrist  has a carpal tunnel scar full range of he confirmed with a negative Claudette Laws test  As far as the left wrist goes she has tenderness over the carpal tunnel and proximal forearm range of motion is normal stability and strength are normal were exam shows no atrophy skin is intact he has decreased sensation in the comparable median nerve distribution  I spoke with her at length regarding her situation. There is no risk factors such as hypothyroidism repetitive motion activities or diabetes but her last surgery didn't work twice so the surgery has a good chance of not working but she says she can't sleep, she has severe pain, she can't function and she just needs to have something done to try to alleviate her problem  So we are recommending a left carpal tunnel release the patient understands that she may indeed not get relief again.  Left carpal tunnel syndrome  Left carpal tunnel release

## 2013-02-16 ENCOUNTER — Encounter (HOSPITAL_COMMUNITY): Payer: Self-pay | Admitting: Pharmacy Technician

## 2013-02-19 ENCOUNTER — Encounter (HOSPITAL_COMMUNITY)
Admission: RE | Admit: 2013-02-19 | Discharge: 2013-02-19 | Disposition: A | Discharge status: Home / Self Care | Payer: Medicare Other | Source: Ambulatory Visit | Attending: Orthopedic Surgery | Admitting: Orthopedic Surgery

## 2013-02-19 ENCOUNTER — Other Ambulatory Visit: Payer: Self-pay

## 2013-02-19 ENCOUNTER — Encounter (HOSPITAL_COMMUNITY): Payer: Self-pay

## 2013-02-19 DIAGNOSIS — Z01810 Encounter for preprocedural cardiovascular examination: Secondary | ICD-10-CM | POA: Insufficient documentation

## 2013-02-19 DIAGNOSIS — Z01812 Encounter for preprocedural laboratory examination: Secondary | ICD-10-CM | POA: Insufficient documentation

## 2013-02-19 DIAGNOSIS — Z01818 Encounter for other preprocedural examination: Secondary | ICD-10-CM | POA: Insufficient documentation

## 2013-02-19 HISTORY — DX: Constipation, unspecified: K59.00

## 2013-02-19 HISTORY — DX: Other specified postprocedural states: R11.2

## 2013-02-19 HISTORY — DX: Other specified postprocedural states: Z98.890

## 2013-02-19 HISTORY — DX: Personal history of other infectious and parasitic diseases: Z86.19

## 2013-02-19 LAB — BASIC METABOLIC PANEL
BUN: 8 mg/dL (ref 6–23)
CO2: 29 mEq/L (ref 19–32)
Calcium: 10.2 mg/dL (ref 8.4–10.5)
Chloride: 104 mEq/L (ref 96–112)
Creatinine, Ser: 0.72 mg/dL (ref 0.50–1.10)
GFR calc Af Amer: >90 mL/min (ref >90)
GFR calc non Af Amer: >90 mL/min (ref >90)
Glucose, Bld: 96 mg/dL (ref 70–99)
Potassium: 3.8 mEq/L (ref 3.5–5.1)
Sodium: 143 mEq/L (ref 135–145)

## 2013-02-19 LAB — HEMOGLOBIN AND HEMATOCRIT, BLOOD
HCT: 39.2 % (ref 36.0–46.0)
Hemoglobin: 13.4 g/dL (ref 12.0–15.0)

## 2013-02-19 NOTE — Patient Instructions (Signed)
BARNEY GERTSCH  02/19/2013   Your procedure is scheduled on:  02/23/13  Report to [facility] at 08:50 AM.  Call this number if you have problems the morning of surgery: [phone]   Remember:   Do not eat food or drink liquids after midnight.   Take these medicines the morning of surgery with A SIP OF WATER: Citalopram and Omeprazole.   Do not wear jewelry, make-up or nail polish.  Do not wear lotions, powders, or perfumes.   Do not shave 48 hours prior to surgery. Men may shave face and neck.  Do not bring valuables to the hospital.  [facility] is not responsible for any belongings or valuables.               Contacts, dentures or bridgework may not be worn into surgery.  Leave suitcase in the car. After surgery it may be brought to your room.  For patients admitted to the hospital, discharge time is determined by your treatment team.               Patients discharged the day of surgery will not be allowed to drive home.   Special Instructions: Shower using CHG 2 nights before surgery and the night before surgery.  If you shower the day of surgery use CHG.  Use special wash - you have one bottle of CHG for all showers.  You should use approximately 1/3 of the bottle for each shower.   Please read over the following fact sheets that you were given: Pain Booklet, Surgical Site Infection Prevention, Anesthesia Post-op Instructions and Care and Recovery After Surgery    Carpal Tunnel Surgery The carpal tunnel is a narrow hollow area in the wrist. It is formed by the wrist bones and ligaments. Nerves, blood vessels, and tendons on the palm side of your hand pass through the carpal tunnel. (The palm side is the side of your hand in the direction your fingers bend.) Repeated wrist motion or certain diseases may cause swelling within the tunnel. That is why these are sometimes called repetitive trauma disorders. It is also a common problem in late pregnancy because of water retention. This  swelling pinches the main nerve in the wrist (median nerve). It causes the painful condition called carpal tunnel syndrome. A feeling of "pins and needles" may be noticed in the fingers or hand. The entire arm may ache from this condition. Carpal tunnel syndrome may clear up by itself. Cortisone injections may help. An electromyogram may be needed to confirm this diagnosis. This is a test which measures nerve conduction. The nerve conduction is usually slowed in a carpal tunnel syndrome. Sometimes, an operation may be needed to free the pinched nerve.  LET YOUR CAREGIVER KNOW ABOUT:  Allergies  Medications taken including herbs, eye drops, over the counter medications, and creams  Use of steroids (by mouth or creams)  Previous problems with anesthetics or novocaine  Possibility of pregnancy, if this applies  History of blood clots (thrombophlebitis)  History of bleeding or blood problems  Previous surgery  Other health problems RISKS AND COMPLICATIONS  Infection: A germ starts growing in the wound. This can usually be treated with antibiotics.  Damage to other organs may occur.  Bleeding following surgery can be a complication of almost all surgeries. Your surgeon takes every precaution to keep this from happening.  Recurrence (return) of carpal tunnel syndrome following treatment is rare. BEFORE THE PROCEDURE  Stop smoking at least two weeks prior  to surgery. This lowers risk during surgery.  Stop non steroidal medicine for ten days prior to surgery. Also, do not take aspirin unless OK'd by your surgeon.  Your caregiver may tell you to stop taking certain medicine that may affect the outcome of the surgery and your ability to heal. For example, you may need to stop taking anti-inflammatories, such as aspirin, because of possible bleeding problems. Other medicine may have interactions with anesthesia.  BE SURE TO LET YOUR CAREGIVER KNOW IF YOU HAVE BEEN ON STEROIDS (INCLUDING  CREAMS) FOR LONG PERIODS OF TIME. THIS IS CRITICAL.  Your caregiver will discuss possible risks and complications with you before surgery. In addition to the usual risks of anesthesia, other common risks and complications include:  Temporary increase in pain due to surgery.  Uncorrected pain.  Infection. You should be present 60 minutes before your procedure or as directed.  PROCEDURE  Carpal tunnel release is generally recommended if symptoms last for 6 months. Surgery involves severing the band of tissue around the wrist to reduce pressure on the median nerve. Surgery is done under local anesthesia and does not require an overnight hospital stay. Many patients require surgery on both hands. The following are types of carpal tunnel release surgery:  Open release surgery, the traditional procedure used to correct carpal tunnel syndrome, consists of making an incision up to 2 inches in the wrist and then cutting the carpal ligament to enlarge the carpal tunnel. The procedure is generally done under local anesthesia on an outpatient basis, unless there are unusual medical considerations.  Endoscopic surgery may allow faster functional recovery and less post-operative discomfort than traditional open release surgery. The surgeon makes two incisions (cuts) (about 1/2 inch each) in the wrist and palm, inserts a camera attached to a tube, looks at the tissue on a screen, and cuts the carpal ligament (the tissue that holds joints together). This two-portal endoscopic surgery, generally performed under local anesthesia, is effective and minimizes scarring and scar tenderness, if any. One-portal endoscopic surgery for carpal tunnel syndrome is also available. Although symptoms may be better right after surgery, full recovery from carpal tunnel surgery can take months. Some patients may have infection, nerve damage, stiffness, and pain at the scar. Sometimes the wrist loses strength because the carpal  ligament is cut. Patients should take part in physical therapy after surgery to restore wrist strength. Some patients may need to adjust job duties or even change jobs after recovery from surgery. The majority of patients recover completely without complications (additional problems). AFTER THE PROCEDURE After surgery, you will be taken to the recovery area where a nurse will watch and check your progress. Once you're awake, stable, and taking fluids well, without other problems you will be allowed to go home. HOME CARE INSTRUCTIONS   Once at home, an ice pack applied to your operative site may help with discomfort and keep the swelling down.  Follow your caregiver's instructions as to activities, exercises, physical therapy, and driving a car.  Maintain strength and range of motion as instructed.  If you were given a splint to keep your wrist from bending, use it as instructed. It is important to wear the splint at night. Use the splint for as long as you have pain or numbness in your hand, arm or wrist. This may take 1 to 2 months.  If you have pain at night, it may help to elevate your hand above the level of your heart (the center of your  chest).  It is important to avoid activities which originally caused your carpal tunnel syndrome for a couple weeks following surgery, or as directed by your surgeon. If your symptoms are work-related, you may need to talk to your employer about changing to a job that does not require using your wrist.  Only take over-the-counter or prescription medicines for pain, discomfort, or fever as directed by your caregiver.  Following periods of extended use, particularly hard (strenuous) use, apply an ice pack wrapped in a towel to the palm (anterior) side of the affected wrist for 20 to 30 minutes. Repeat as needed three to four times per day. This will help reduce swelling following surgery. SEEK MEDICAL CARE IF:   There is increased bleeding (more than a  small spot) from the wound.  You notice redness, swelling, or increasing pain in the wound.  Pus is coming from wound.  An unexplained oral temperature above 102 F (38.9 C) develops.  You notice a foul smell coming from the wound or dressing. SEEK IMMEDIATE MEDICAL CARE IF:   You develop a rash.  You have difficulty breathing.  You have any problems you think are related to allergies. Document Released: 12/09/2003 Document Revised: 07/19/2011 Document Reviewed: 03/02/2007 [facility] Patient Information 2014 [place], [place].   PATIENT INSTRUCTIONS POST-ANESTHESIA  IMMEDIATELY FOLLOWING SURGERY:  Do not drive or operate machinery for the first twenty four hours after surgery.  Do not make any important decisions for twenty four hours after surgery or while taking narcotic pain medications or sedatives.  If you develop intractable nausea and vomiting or a severe headache please notify your doctor immediately.  FOLLOW-UP:  Please make an appointment with your surgeon as instructed. You do not need to follow up with anesthesia unless specifically instructed to do so.  WOUND CARE INSTRUCTIONS (if applicable):  Keep a dry clean dressing on the anesthesia/puncture wound site if there is drainage.  Once the wound has quit draining you may leave it open to air.  Generally you should leave the bandage intact for twenty four hours unless there is drainage.  If the epidural site drains for more than 36-48 hours please call the anesthesia department.  QUESTIONS?:  Please feel free to call your physician or the hospital operator if you have any questions, and they will be happy to assist you.

## 2013-02-22 NOTE — H&P (Signed)
Chief Complaint   Patient presents with   .  Wrist Pain       Left hand pain with numbness and tingling in fingers     HISTORY: 52 year old female presents status post right carpal tunnel release and a repeat surgery for continued symptoms with no resolution. Presents now with left hand numbness tingling inability to sleep at night weakness characteristic fine motor activity difficulties despite wearing braces and taking oral medication.  She notes that her hands feel heavy her pain is a 10 she denies neck pain she reports pain running distal to proximal. Her symptoms are worse with use  Review of systems: She reports positive review of symptoms as constipation nervousness anxiety depression otherwise she reported all other systems is negative    Past Medical History   Diagnosis  Date   .  Diabetes mellitus     .  Gout     .  GERD (gastroesophageal reflux disease)     .  Anxiety     .  Arthritis     .  Depression     .  Hepatitis C     .  Osteoporosis     .  History of rheumatic fever     .  DDD (degenerative disc disease), cervical     .  Hyperlipidemia     .  Carpal tunnel syndrome on both sides         Past Surgical History   Procedure  Laterality  Date   .  Abdominal hysterectomy       .  Carpal tunnel release           bilateral   .  Carpal tunnel release           right wrist X2   .  Cholecystectomy           Family History   Problem  Relation  Age of Onset   .  Hypertension  Mother     .  Hyperlipidemia  Mother     .  Heart disease  Father        History   Substance Use Topics   .  Smoking status:  Former Smoker -- 0.50 packs/day for 10 years       Types:  Cigarettes       Quit date:  08/09/2010   .  Smokeless tobacco:  Not on file   .  Alcohol Use:  No      Blood pressure 133/81, height 4\' 11"  (1.499 m), weight 144 lb (65.318 kg).  General appearance is normal, the patient is alert and oriented x3 with normal mood and affect. Ambulation is  normal  Inspection reveals that her right wrist has a carpal tunnel scar full range of he confirmed with a negative Claudette Laws test  As far as the left wrist goes she has tenderness over the carpal tunnel and proximal forearm range of motion is normal stability and strength are normal were exam shows no atrophy skin is intact he has decreased sensation in the comparable median nerve distribution  I spoke with her at length regarding her situation. There is no risk factors such as hypothyroidism repetitive motion activities or diabetes but her last surgery didn't work twice so the surgery has a good chance of not working but she says she can't sleep, she has severe pain, she can't function and she just needs to have something done to try to alleviate her problem  So we  are recommending a left carpal tunnel release the patient understands that she may indeed not get relief again.  Left carpal tunnel syndrome  Left carpal tunnel release

## 2013-02-23 ENCOUNTER — Ambulatory Visit (HOSPITAL_COMMUNITY): Payer: Medicare Other | Admitting: Anesthesiology

## 2013-02-23 ENCOUNTER — Encounter (HOSPITAL_COMMUNITY): Payer: Medicare Other | Admitting: Anesthesiology

## 2013-02-23 ENCOUNTER — Encounter (HOSPITAL_COMMUNITY)
Admission: RE | Disposition: A | Discharge status: Home / Self Care | Payer: Self-pay | Source: Ambulatory Visit | Attending: Orthopedic Surgery

## 2013-02-23 ENCOUNTER — Ambulatory Visit (HOSPITAL_COMMUNITY)
Admission: RE | Admit: 2013-02-23 | Discharge: 2013-02-23 | Disposition: A | Discharge status: Home / Self Care | Payer: Medicare Other | Source: Ambulatory Visit | Attending: Orthopedic Surgery | Admitting: Orthopedic Surgery

## 2013-02-23 ENCOUNTER — Encounter (HOSPITAL_COMMUNITY): Payer: Self-pay | Admitting: *Deleted

## 2013-02-23 DIAGNOSIS — G5600 Carpal tunnel syndrome, unspecified upper limb: Secondary | ICD-10-CM

## 2013-02-23 DIAGNOSIS — G5602 Carpal tunnel syndrome, left upper limb: Secondary | ICD-10-CM

## 2013-02-23 DIAGNOSIS — E119 Type 2 diabetes mellitus without complications: Secondary | ICD-10-CM | POA: Insufficient documentation

## 2013-02-23 HISTORY — PX: CARPAL TUNNEL RELEASE: SHX101

## 2013-02-23 SURGERY — CARPAL TUNNEL RELEASE
Anesthesia: Monitor Anesthesia Care | Site: Wrist | Laterality: Left | Wound class: Clean

## 2013-02-23 MED ORDER — MIDAZOLAM HCL 5 MG/5ML IJ SOLN
INTRAMUSCULAR | Status: DC | PRN
  Administered 2013-02-23: 2 mg via INTRAVENOUS

## 2013-02-23 MED ORDER — PROPOFOL 10 MG/ML IV BOLUS
INTRAVENOUS | Status: AC
  Filled 2013-02-23: qty 20

## 2013-02-23 MED ORDER — FENTANYL CITRATE 0.05 MG/ML IJ SOLN
INTRAMUSCULAR | Status: DC | PRN
  Administered 2013-02-23: 25 ug via INTRAVENOUS
  Administered 2013-02-23: 50 ug via INTRAVENOUS
  Administered 2013-02-23: 25 ug via INTRAVENOUS

## 2013-02-23 MED ORDER — MIDAZOLAM HCL 2 MG/2ML IJ SOLN
INTRAMUSCULAR | Status: AC
  Filled 2013-02-23: qty 2

## 2013-02-23 MED ORDER — FENTANYL CITRATE 0.05 MG/ML IJ SOLN
25.0000 ug | INTRAMUSCULAR | Status: AC
  Administered 2013-02-23 (×2): 25 ug via INTRAVENOUS

## 2013-02-23 MED ORDER — SODIUM CHLORIDE 0.9 % IJ SOLN
INTRAMUSCULAR | Status: AC
  Filled 2013-02-23: qty 10

## 2013-02-23 MED ORDER — FENTANYL CITRATE 0.05 MG/ML IJ SOLN
25.0000 ug | INTRAMUSCULAR | Status: DC | PRN
  Administered 2013-02-23: 25 ug via INTRAVENOUS
  Filled 2013-02-23: qty 2

## 2013-02-23 MED ORDER — BUPIVACAINE HCL (PF) 0.5 % IJ SOLN
INTRAMUSCULAR | Status: DC | PRN
  Administered 2013-02-23: 10 mL

## 2013-02-23 MED ORDER — MIDAZOLAM HCL 2 MG/2ML IJ SOLN
1.0000 mg | INTRAMUSCULAR | Status: DC | PRN
  Administered 2013-02-23: 2 mg via INTRAVENOUS

## 2013-02-23 MED ORDER — PROPOFOL INFUSION 10 MG/ML OPTIME
INTRAVENOUS | Status: DC | PRN
  Administered 2013-02-23: 55 ug/kg/min via INTRAVENOUS

## 2013-02-23 MED ORDER — ONDANSETRON HCL 4 MG/2ML IJ SOLN: 4.0000 mg | Freq: Once | INTRAMUSCULAR | Status: DC | PRN

## 2013-02-23 MED ORDER — LIDOCAINE HCL (PF) 0.5 % IJ SOLN
INTRAMUSCULAR | Status: DC | PRN
  Administered 2013-02-23: 250 mg via INTRAVENOUS

## 2013-02-23 MED ORDER — CHLORHEXIDINE GLUCONATE 4 % EX LIQD: 60.0000 mL | Freq: Once | CUTANEOUS | Status: DC

## 2013-02-23 MED ORDER — LIDOCAINE HCL (PF) 0.5 % IJ SOLN
INTRAMUSCULAR | Status: AC
  Filled 2013-02-23: qty 50

## 2013-02-23 MED ORDER — DEXAMETHASONE SODIUM PHOSPHATE 4 MG/ML IJ SOLN
4.0000 mg | Freq: Once | INTRAMUSCULAR | Status: AC
  Administered 2013-02-23: 4 mg via INTRAVENOUS
  Filled 2013-02-23: qty 1

## 2013-02-23 MED ORDER — HYDROCODONE-ACETAMINOPHEN 7.5-325 MG PO TABS: 1.0000 | ORAL_TABLET | ORAL | Status: DC | PRN

## 2013-02-23 MED ORDER — SODIUM CHLORIDE 0.9 % IR SOLN
Status: DC | PRN
  Administered 2013-02-23: 1000 mL

## 2013-02-23 MED ORDER — ONDANSETRON HCL 4 MG/2ML IJ SOLN
4.0000 mg | Freq: Once | INTRAMUSCULAR | Status: AC
  Administered 2013-02-23: 4 mg via INTRAVENOUS
  Filled 2013-02-23: qty 2

## 2013-02-23 MED ORDER — FENTANYL CITRATE 0.05 MG/ML IJ SOLN
INTRAMUSCULAR | Status: AC
  Filled 2013-02-23: qty 2

## 2013-02-23 MED ORDER — LACTATED RINGERS IV SOLN
INTRAVENOUS | Status: DC
  Administered 2013-02-23: 1000 mL via INTRAVENOUS

## 2013-02-23 MED ORDER — BUPIVACAINE HCL (PF) 0.5 % IJ SOLN
INTRAMUSCULAR | Status: AC
  Filled 2013-02-23: qty 30

## 2013-02-23 MED ORDER — CEFAZOLIN SODIUM-DEXTROSE 2-3 GM-% IV SOLR
2.0000 g | INTRAVENOUS | Status: AC
  Administered 2013-02-23: 2 g via INTRAVENOUS
  Filled 2013-02-23: qty 50

## 2013-02-23 SURGICAL SUPPLY — 36 items
BAG HAMPER (MISCELLANEOUS) ×2 IMPLANT
BANDAGE ELASTIC 3 VELCRO NS (GAUZE/BANDAGES/DRESSINGS) ×2 IMPLANT
BANDAGE ESMARK 4X12 BL STRL LF (DISPOSABLE) ×1 IMPLANT
BANDAGE GAUZE ELAST BULKY 4 IN (GAUZE/BANDAGES/DRESSINGS) ×2 IMPLANT
BLADE SURG 15 STRL LF DISP TIS (BLADE) ×1 IMPLANT
BLADE SURG 15 STRL SS (BLADE) ×1
BNDG COHESIVE 4X5 TAN NS LF (GAUZE/BANDAGES/DRESSINGS) ×2 IMPLANT
BNDG ESMARK 4X12 BLUE STRL LF (DISPOSABLE) ×2
CHLORAPREP W/TINT 26ML (MISCELLANEOUS) ×2 IMPLANT
CLOTH BEACON ORANGE TIMEOUT ST (SAFETY) ×2 IMPLANT
COVER LIGHT HANDLE STERIS (MISCELLANEOUS) ×4 IMPLANT
CUFF TOURNIQUET SINGLE 18IN (TOURNIQUET CUFF) ×2 IMPLANT
DECANTER SPIKE VIAL GLASS SM (MISCELLANEOUS) ×2 IMPLANT
DRSG XEROFORM 1X8 (GAUZE/BANDAGES/DRESSINGS) ×2 IMPLANT
ELECT NEEDLE TIP 2.8 STRL (NEEDLE) ×2 IMPLANT
ELECT REM PT RETURN 9FT ADLT (ELECTROSURGICAL) ×2
ELECTRODE REM PT RTRN 9FT ADLT (ELECTROSURGICAL) ×1 IMPLANT
GLOVE BIOGEL PI IND STRL 7.0 (GLOVE) ×1 IMPLANT
GLOVE BIOGEL PI INDICATOR 7.0 (GLOVE) ×1
GLOVE ECLIPSE 6.5 STRL STRAW (GLOVE) ×2 IMPLANT
GLOVE EXAM NITRILE MD LF STRL (GLOVE) ×2 IMPLANT
GLOVE SKINSENSE NS SZ8.0 LF (GLOVE) ×1
GLOVE SKINSENSE STRL SZ8.0 LF (GLOVE) ×1 IMPLANT
GLOVE SS N UNI LF 8.5 STRL (GLOVE) ×2 IMPLANT
GOWN STRL REIN XL XLG (GOWN DISPOSABLE) ×6 IMPLANT
HAND ALUMI XLG (SOFTGOODS) ×2 IMPLANT
KIT ROOM TURNOVER APOR (KITS) ×2 IMPLANT
MANIFOLD NEPTUNE II (INSTRUMENTS) ×2 IMPLANT
NEEDLE HYPO 21X1.5 SAFETY (NEEDLE) ×2 IMPLANT
NS IRRIG 1000ML POUR BTL (IV SOLUTION) ×2 IMPLANT
PACK BASIC LIMB (CUSTOM PROCEDURE TRAY) ×2 IMPLANT
PAD ARMBOARD 7.5X6 YLW CONV (MISCELLANEOUS) ×2 IMPLANT
SET BASIN LINEN APH (SET/KITS/TRAYS/PACK) ×2 IMPLANT
SPONGE GAUZE 4X4 12PLY (GAUZE/BANDAGES/DRESSINGS) ×2 IMPLANT
SUT ETHILON 3 0 FSL (SUTURE) ×2 IMPLANT
SYR CONTROL 10ML LL (SYRINGE) ×2 IMPLANT

## 2013-02-23 NOTE — Op Note (Signed)
02/23/2013  11:11 AM  PATIENT:  Sabrina Beasley  52 y.o. female  PRE-OPERATIVE DIAGNOSIS:  Left carpal tunnel syndrome  POST-OPERATIVE DIAGNOSIS:  Left carpal tunnel syndrome  PROCEDURE:  Procedure(s): CARPAL TUNNEL RELEASE (Left)  SURGEON:  Surgeon(s) and Role:    * Vickki Hearing, MD - Primary  PHYSICIAN ASSISTANT:   ASSISTANTS: none   ANESTHESIA:   regional  EBL:  Total I/O In: 100 [I.V.:100] Out: -   BLOOD ADMINISTERED:none  DRAINS: none   LOCAL MEDICATIONS USED:  MARCAINE    and Amount: 10 ml  SPECIMEN:  No Specimen  DISPOSITION OF SPECIMEN:  N/A  COUNTS:  YES  TOURNIQUET:  * Missing tourniquet times found for documented tourniquets in log:  [id number] *  DICTATION: .Dragon Dictation  PLAN OF CARE: Discharge to home after PACU  PATIENT DISPOSITION:  PACU - hemodynamically stable.   Delay start of Pharmacological VTE agent (>24hrs) due to surgical blood loss or risk of bleeding: not applicable  Preop diagnosis carpal tunnel syndrome LEFT wrist Postop diagnosis carpal tunnel syndrome LEFT wrist Procedure open left carpal tunnel release  Details of procedure: The patient was identified in the preop holding area and the surgical site LEFT WRISTS AND HAND  was marked. The history and physical update was completed.  Antibiotics were ordered and the patient was given IN THE SURGICAL SUITE.  In the surgical suite a Bier block was done and then the arm was prepped and draped in sterile technique.  At this point the timeout procedure was initiated and completed.  The incision was made over the LEFT carpal tunnel and extended to the palmar fascia.  The palmar fascia was divided bluntly and dissection was carried out to the distal aspect of the transverse carpal ligament.  Blunt dissection was carried out beneath the ligament and sharp dissection was used to release the ligament.  The contents of the carpal tunnel were inspected and found to be free of any other pathology.   Irrigation was performed.  Closure was with interrupted 3-0 nylon suture.  20 cc of plain Sensorcaine was injected around the incision edges.  A sterile bandage was applied.  The tourniquet was released the color of the fingers was normal and the capillary refill was normal.

## 2013-02-23 NOTE — Brief Op Note (Addendum)
02/23/2013  11:11 AM  PATIENT:  Sabrina Beasley  52 y.o. female  PRE-OPERATIVE DIAGNOSIS:  Left carpal tunnel syndrome  POST-OPERATIVE DIAGNOSIS:  Left carpal tunnel syndrome  PROCEDURE:  Procedure(s): CARPAL TUNNEL RELEASE (Left)  SURGEON:  Surgeon(s) and Role:    * Stanley E Harrison, MD - Primary  PHYSICIAN ASSISTANT:   ASSISTANTS: none   ANESTHESIA:   regional  EBL:  Total I/O In: 100 [I.V.:100] Out: -   BLOOD ADMINISTERED:none  DRAINS: none   LOCAL MEDICATIONS USED:  MARCAINE    and Amount: 10 ml  SPECIMEN:  No Specimen  DISPOSITION OF SPECIMEN:  N/A  COUNTS:  YES  TOURNIQUET:  * Missing tourniquet times found for documented tourniquets in log:  [id number] *  DICTATION: .Dragon Dictation  PLAN OF CARE: Discharge to home after PACU  PATIENT DISPOSITION:  PACU - hemodynamically stable.   Delay start of Pharmacological VTE agent (>24hrs) due to surgical blood loss or risk of bleeding: not applicable  Preop diagnosis carpal tunnel syndrome LEFT wrist Postop diagnosis carpal tunnel syndrome LEFT wrist Procedure open left carpal tunnel release  Details of procedure: The patient was identified in the preop holding area and the surgical site LEFT WRISTS AND HAND  was marked. The history and physical update was completed.  Antibiotics were ordered and the patient was given IN THE SURGICAL SUITE.  In the surgical suite a Bier block was done and then the arm was prepped and draped in sterile technique.  At this point the timeout procedure was initiated and completed.  The incision was made over the LEFT carpal tunnel and extended to the palmar fascia.  The palmar fascia was divided bluntly and dissection was carried out to the distal aspect of the transverse carpal ligament.  Blunt dissection was carried out beneath the ligament and sharp dissection was used to release the ligament.  The contents of the carpal tunnel were inspected and found to be free of any other pathology.   Irrigation was performed.  Closure was with interrupted 3-0 nylon suture.  20 cc of plain Sensorcaine was injected around the incision edges.  A sterile bandage was applied.  The tourniquet was released the color of the fingers was normal and the capillary refill was normal.   

## 2013-02-23 NOTE — Interval H&P Note (Signed)
History and Physical Interval Note:  02/23/2013 10:03 AM  Sabrina Beasley  has presented today for surgery, with the diagnosis of Left carpal tunnel syndrome  The various methods of treatment have been discussed with the patient and family. After consideration of risks, benefits and other options for treatment, the patient has consented to  Procedure(s): CARPAL TUNNEL RELEASE (Left) as a surgical intervention .  The patient's history has been reviewed, patient examined, no change in status, stable for surgery.  I have reviewed the patient's chart and labs.  Questions were answered to the patient's satisfaction.     Fuller Canada

## 2013-02-23 NOTE — Transfer of Care (Signed)
Immediate Anesthesia Transfer of Care Note  Patient: Sabrina Beasley  Procedure(s) Performed: Procedure(s): CARPAL TUNNEL RELEASE (Left)  Patient Location: PACU  Anesthesia Type:Bier block  Level of Consciousness: awake, alert  and patient cooperative  Airway & Oxygen Therapy: Patient Spontanous Breathing and Patient connected to face mask oxygen  Post-op Assessment: Report given to PACU RN, Post -op Vital signs reviewed and stable and Patient moving all extremities  Post vital signs: Reviewed and stable  Complications: No apparent anesthesia complications

## 2013-02-23 NOTE — Anesthesia Preprocedure Evaluation (Signed)
Anesthesia Evaluation  Patient identified by MRN, date of birth, ID band Patient awake    Reviewed: Allergy & Precautions, H&P , NPO status , Patient's Chart, lab work & pertinent test results  History of Anesthesia Complications (+) PONV and history of anesthetic complications  Airway Mallampati: I TM Distance: >3 FB Neck ROM: Full    Dental  (+) Teeth Intact   Pulmonary Current Smoker,  breath sounds clear to auscultation        Cardiovascular negative cardio ROS  Rhythm:Regular Rate:Normal     Neuro/Psych PSYCHIATRIC DISORDERS Anxiety Depression  Neuromuscular disease    GI/Hepatic GERD-  ,(+) Hepatitis -, C  Endo/Other    Renal/GU      Musculoskeletal   Abdominal   Peds  Hematology   Anesthesia Other Findings   Reproductive/Obstetrics                           Anesthesia Physical Anesthesia Plan  ASA: III  Anesthesia Plan: MAC and Bier Block   Post-op Pain Management:    Induction: Intravenous  Airway Management Planned: Nasal Cannula  Additional Equipment:   Intra-op Plan:   Post-operative Plan:   Informed Consent: I have reviewed the patients History and Physical, chart, labs and discussed the procedure including the risks, benefits and alternatives for the proposed anesthesia with the patient or authorized representative who has indicated his/her understanding and acceptance.     Plan Discussed with:   Anesthesia Plan Comments:         Anesthesia Quick Evaluation

## 2013-02-23 NOTE — Anesthesia Postprocedure Evaluation (Signed)
  Anesthesia Post-op Note  Patient: ELYSIA GRAND  Procedure(s) Performed: Procedure(s): CARPAL TUNNEL RELEASE (Left)  Patient Location: PACU  Anesthesia Type:Bier block  Level of Consciousness: awake, alert , oriented and patient cooperative  Airway and Oxygen Therapy: Patient Spontanous Breathing and Patient connected to face mask oxygen  Post-op Pain: 3 /10, mild  Post-op Assessment: Post-op Vital signs reviewed, Patient's Cardiovascular Status Stable, Respiratory Function Stable, Patent Airway, No signs of Nausea or vomiting and Pain level controlled  Post-op Vital Signs: Reviewed and stable  Complications: No apparent anesthesia complications

## 2013-02-26 ENCOUNTER — Encounter: Payer: Self-pay | Admitting: Orthopedic Surgery

## 2013-02-26 ENCOUNTER — Ambulatory Visit (INDEPENDENT_AMBULATORY_CARE_PROVIDER_SITE_OTHER): Payer: Medicare Other | Admitting: Orthopedic Surgery

## 2013-02-26 VITALS — BP 127/81 | Ht 59.0 in | Wt 144.0 lb

## 2013-02-26 DIAGNOSIS — Z9889 Other specified postprocedural states: Secondary | ICD-10-CM

## 2013-02-26 DIAGNOSIS — G5600 Carpal tunnel syndrome, unspecified upper limb: Secondary | ICD-10-CM

## 2013-02-26 DIAGNOSIS — G5602 Carpal tunnel syndrome, left upper limb: Secondary | ICD-10-CM

## 2013-02-26 NOTE — Progress Notes (Signed)
Patient ID: Sabrina Beasley, female   DOB: Jun 07, 1960, 52 y.o.   MRN: [id number] Chief Complaint  Patient presents with  . Follow-up    Post op 1 left Carpal Tunnel Release DOS 02/23/13     Postoperative visit  Procedure: (left  CTR)  Date of procedure 10-17  Diagnosis Carpal Tunnel Syndrome   Operative findings Median Nerve compression  Appearance of incision normal   ROM 75 %   Patient complaints none   Plan suture removal in / on 10-30

## 2013-02-26 NOTE — Patient Instructions (Signed)
Keep clean and dry

## 2013-03-08 ENCOUNTER — Ambulatory Visit (INDEPENDENT_AMBULATORY_CARE_PROVIDER_SITE_OTHER): Payer: Medicare Other | Admitting: Orthopedic Surgery

## 2013-03-08 ENCOUNTER — Encounter: Payer: Self-pay | Admitting: Orthopedic Surgery

## 2013-03-08 VITALS — BP 126/71 | Ht 59.0 in | Wt 144.0 lb

## 2013-03-08 DIAGNOSIS — Z9889 Other specified postprocedural states: Secondary | ICD-10-CM

## 2013-03-08 NOTE — Progress Notes (Signed)
Patient ID: Sabrina Beasley, female   DOB: [birth year], 52 y.o.   MRN: [id number] Chief Complaint  Patient presents with  . Follow-up    10 day follow up right carpal tunnel release DOS 02/23/13   Postop day #13 status post carpal tunnel release left wrist. Patient doing well sutures removed wound clean Steri-Strips applied instructions given follow up in 4 weeks to see final results

## 2013-03-15 ENCOUNTER — Other Ambulatory Visit: Payer: Self-pay

## 2013-04-09 ENCOUNTER — Ambulatory Visit (INDEPENDENT_AMBULATORY_CARE_PROVIDER_SITE_OTHER): Payer: Medicare Other | Admitting: Orthopedic Surgery

## 2013-04-09 ENCOUNTER — Encounter: Payer: Self-pay | Admitting: Orthopedic Surgery

## 2013-04-09 VITALS — BP 128/80 | Ht 59.0 in | Wt 144.0 lb

## 2013-04-09 DIAGNOSIS — Z9889 Other specified postprocedural states: Secondary | ICD-10-CM

## 2013-04-09 NOTE — Progress Notes (Signed)
Patient ID: Sabrina Beasley, female   DOB: 08/07/1960, 52 y.o.   MRN: [id number] Chief Complaint  Patient presents with  . Follow-up    Post op 3 left carpal tunnel release DOS 02/23/13    BP 128/80  Ht 4\' 11"  (1.499 m)  Wt 144 lb (65.318 kg)  BMI 29.07 kg/m2  Doing well post op left CTR  She says she is happy with the result She has some incisional pain  But her numbness and tingling have improved

## 2013-04-09 NOTE — Patient Instructions (Signed)
activities as tolerated 

## 2013-05-19 ENCOUNTER — Encounter (HOSPITAL_COMMUNITY): Payer: Self-pay | Admitting: Emergency Medicine

## 2013-05-19 ENCOUNTER — Emergency Department (HOSPITAL_COMMUNITY)
Admission: EM | Admit: 2013-05-19 | Discharge: 2013-05-19 | Disposition: A | Discharge status: Home / Self Care | Payer: Medicare Other | Attending: Emergency Medicine | Admitting: Emergency Medicine

## 2013-05-19 DIAGNOSIS — F3289 Other specified depressive episodes: Secondary | ICD-10-CM | POA: Insufficient documentation

## 2013-05-19 DIAGNOSIS — F17200 Nicotine dependence, unspecified, uncomplicated: Secondary | ICD-10-CM | POA: Insufficient documentation

## 2013-05-19 DIAGNOSIS — IMO0001 Reserved for inherently not codable concepts without codable children: Secondary | ICD-10-CM | POA: Insufficient documentation

## 2013-05-19 DIAGNOSIS — K219 Gastro-esophageal reflux disease without esophagitis: Secondary | ICD-10-CM | POA: Insufficient documentation

## 2013-05-19 DIAGNOSIS — Z8619 Personal history of other infectious and parasitic diseases: Secondary | ICD-10-CM | POA: Insufficient documentation

## 2013-05-19 DIAGNOSIS — M5137 Other intervertebral disc degeneration, lumbosacral region: Secondary | ICD-10-CM | POA: Insufficient documentation

## 2013-05-19 DIAGNOSIS — E785 Hyperlipidemia, unspecified: Secondary | ICD-10-CM | POA: Insufficient documentation

## 2013-05-19 DIAGNOSIS — N390 Urinary tract infection, site not specified: Secondary | ICD-10-CM

## 2013-05-19 DIAGNOSIS — M129 Arthropathy, unspecified: Secondary | ICD-10-CM | POA: Insufficient documentation

## 2013-05-19 DIAGNOSIS — M810 Age-related osteoporosis without current pathological fracture: Secondary | ICD-10-CM | POA: Insufficient documentation

## 2013-05-19 DIAGNOSIS — IMO0002 Reserved for concepts with insufficient information to code with codable children: Secondary | ICD-10-CM | POA: Insufficient documentation

## 2013-05-19 DIAGNOSIS — Z8669 Personal history of other diseases of the nervous system and sense organs: Secondary | ICD-10-CM | POA: Insufficient documentation

## 2013-05-19 DIAGNOSIS — Z79899 Other long term (current) drug therapy: Secondary | ICD-10-CM | POA: Insufficient documentation

## 2013-05-19 DIAGNOSIS — Z90710 Acquired absence of both cervix and uterus: Secondary | ICD-10-CM | POA: Insufficient documentation

## 2013-05-19 DIAGNOSIS — Z792 Long term (current) use of antibiotics: Secondary | ICD-10-CM | POA: Insufficient documentation

## 2013-05-19 DIAGNOSIS — F329 Major depressive disorder, single episode, unspecified: Secondary | ICD-10-CM | POA: Insufficient documentation

## 2013-05-19 DIAGNOSIS — M51379 Other intervertebral disc degeneration, lumbosacral region without mention of lumbar back pain or lower extremity pain: Secondary | ICD-10-CM | POA: Insufficient documentation

## 2013-05-19 DIAGNOSIS — J40 Bronchitis, not specified as acute or chronic: Secondary | ICD-10-CM

## 2013-05-19 DIAGNOSIS — J209 Acute bronchitis, unspecified: Secondary | ICD-10-CM | POA: Insufficient documentation

## 2013-05-19 DIAGNOSIS — F411 Generalized anxiety disorder: Secondary | ICD-10-CM | POA: Insufficient documentation

## 2013-05-19 LAB — URINALYSIS, ROUTINE W REFLEX MICROSCOPIC
Bilirubin Urine: NEGATIVE
Glucose, UA: NEGATIVE mg/dL
Hgb urine dipstick: NEGATIVE
Ketones, ur: NEGATIVE mg/dL
Nitrite: NEGATIVE
Protein, ur: NEGATIVE mg/dL
Specific Gravity, Urine: 1.02 (ref 1.005–1.030)
Urobilinogen, UA: 0.2 mg/dL (ref 0.0–1.0)
pH: 6 (ref 5.0–8.0)

## 2013-05-19 LAB — URINE MICROSCOPIC-ADD ON

## 2013-05-19 LAB — WET PREP, GENITAL
Clue Cells Wet Prep HPF POC: NONE SEEN
Trich, Wet Prep: NONE SEEN
Yeast Wet Prep HPF POC: NONE SEEN

## 2013-05-19 MED ORDER — DIPHENHYDRAMINE HCL 50 MG/ML IJ SOLN
25.0000 mg | Freq: Once | INTRAMUSCULAR | Status: AC
  Administered 2013-05-19: 25 mg via INTRAMUSCULAR
  Filled 2013-05-19: qty 1

## 2013-05-19 MED ORDER — PHENAZOPYRIDINE HCL 200 MG PO TABS: 200.0000 mg | ORAL_TABLET | Freq: Three times a day (TID) | ORAL | Status: DC

## 2013-05-19 MED ORDER — SULFAMETHOXAZOLE-TRIMETHOPRIM 800-160 MG PO TABS: 1.0000 | ORAL_TABLET | Freq: Two times a day (BID) | ORAL | Status: DC

## 2013-05-19 NOTE — ED Provider Notes (Signed)
Medical screening examination/treatment/procedure(s) were performed by non-physician practitioner and as supervising physician I was immediately available for consultation/collaboration.  EKG Interpretation   None         Maudry Diego, MD 05/19/13 [phone]

## 2013-05-19 NOTE — ED Notes (Signed)
Pelvic cart at bedside. 

## 2013-05-19 NOTE — Discharge Instructions (Signed)
Bronchitis Bronchitis is inflammation of the airways that extend from the windpipe into the lungs (bronchi). The inflammation often causes mucus to develop, which leads to a cough. If the inflammation becomes severe, it may cause shortness of breath. CAUSES  Bronchitis may be caused by:   Viral infections.   Bacteria.   Cigarette smoke.   Allergens, pollutants, and other irritants.  SIGNS AND SYMPTOMS  The most common symptom of bronchitis is a frequent cough that produces mucus. Other symptoms include:  Fever.   Body aches.   Chest congestion.   Chills.   Shortness of breath.   Sore throat.  DIAGNOSIS  Bronchitis is usually diagnosed through a medical history and physical exam. Tests, such as chest X-rays, are sometimes done to rule out other conditions.  TREATMENT  You may need to avoid contact with whatever caused the problem (smoking, for example). Medicines are sometimes needed. These may include:  Antibiotics. These may be prescribed if the condition is caused by bacteria.  Cough suppressants. These may be prescribed for relief of cough symptoms.   Inhaled medicines. These may be prescribed to help open your airways and make it easier for you to breathe.   Steroid medicines. These may be prescribed for those with recurrent (chronic) bronchitis. HOME CARE INSTRUCTIONS  Get plenty of rest.   Drink enough fluids to keep your urine clear or pale yellow (unless you have a medical condition that requires fluid restriction). Increasing fluids may help thin your secretions and will prevent dehydration.   Only take over-the-counter or prescription medicines as directed by your health care provider.  Only take antibiotics as directed. Make sure you finish them even if you start to feel better.  Avoid secondhand smoke, irritating chemicals, and strong fumes. These will make bronchitis worse. If you are a smoker, quit smoking. Consider using nicotine gum or  skin patches to help control withdrawal symptoms. Quitting smoking will help your lungs heal faster.   Put a cool-mist humidifier in your bedroom at night to moisten the air. This may help loosen mucus. Change the water in the humidifier daily. You can also run the hot water in your shower and sit in the bathroom with the door closed for 5 10 minutes.   Follow up with your health care provider as directed.   Wash your hands frequently to avoid catching bronchitis again or spreading an infection to others.  SEEK MEDICAL CARE IF: Your symptoms do not improve after 1 week of treatment.  SEEK IMMEDIATE MEDICAL CARE IF:  Your fever increases.  You have chills.   You have chest pain.   You have worsening shortness of breath.   You have bloody sputum.  You faint.  You have lightheadedness.  You have a severe headache.   You vomit repeatedly. MAKE SURE YOU:   Understand these instructions.  Will watch your condition.  Will get help right away if you are not doing well or get worse. Document Released: 04/26/2005 Document Revised: 02/14/2013 Document Reviewed: 12/19/2012 [facility] Patient Information 2014 [place].  Urinary Tract Infection Urinary tract infections (UTIs) can develop anywhere along your urinary tract. Your urinary tract is your body's drainage system for removing wastes and extra water. Your urinary tract includes two kidneys, two ureters, a bladder, and a urethra. Your kidneys are a pair of bean-shaped organs. Each kidney is about the size of your fist. They are located below your ribs, one on each side of your spine. CAUSES Infections are caused by  microbes, which are microscopic organisms, including fungi, viruses, and bacteria. These organisms are so small that they can only be seen through a microscope. Bacteria are the microbes that most commonly cause UTIs. SYMPTOMS  Symptoms of UTIs may vary by age and gender of the patient and by the location  of the infection. Symptoms in young women typically include a frequent and intense urge to urinate and a painful, burning feeling in the bladder or urethra during urination. Older women and men are more likely to be tired, shaky, and weak and have muscle aches and abdominal pain. A fever may mean the infection is in your kidneys. Other symptoms of a kidney infection include pain in your back or sides below the ribs, nausea, and vomiting. DIAGNOSIS To diagnose a UTI, your caregiver will ask you about your symptoms. Your caregiver also will ask to provide a urine sample. The urine sample will be tested for bacteria and white blood cells. White blood cells are made by your body to help fight infection. TREATMENT  Typically, UTIs can be treated with medication. Because most UTIs are caused by a bacterial infection, they usually can be treated with the use of antibiotics. The choice of antibiotic and length of treatment depend on your symptoms and the type of bacteria causing your infection. HOME CARE INSTRUCTIONS  If you were prescribed antibiotics, take them exactly as your caregiver instructs you. Finish the medication even if you feel better after you have only taken some of the medication.  Drink enough water and fluids to keep your urine clear or pale yellow.  Avoid caffeine, tea, and carbonated beverages. They tend to irritate your bladder.  Empty your bladder often. Avoid holding urine for long periods of time.  Empty your bladder before and after sexual intercourse.  After a bowel movement, women should cleanse from front to back. Use each tissue only once. SEEK MEDICAL CARE IF:   You have back pain.  You develop a fever.  Your symptoms do not begin to resolve within 3 days. SEEK IMMEDIATE MEDICAL CARE IF:   You have severe back pain or lower abdominal pain.  You develop chills.  You have nausea or vomiting.  You have continued burning or discomfort with urination. MAKE SURE  YOU:   Understand these instructions.  Will watch your condition.  Will get help right away if you are not doing well or get worse. Document Released: 02/03/2005 Document Revised: 10/26/2011 Document Reviewed: 06/04/2011 [facility] Patient Information 2014 [place].

## 2013-05-19 NOTE — ED Provider Notes (Signed)
CSN: [id number]     Arrival date & time 05/19/13  1237 History   First MD Initiated Contact with Patient 05/19/13 1333     Chief Complaint  Patient presents with  . Urinary Tract Infection  . Vaginal Discharge  . Nasal Congestion   (Consider location/radiation/quality/duration/timing/severity/associated sxs/prior Treatment) HPI Comments: Patient here after having been placed on abx about 1.5 weeks ago for tooth infection - she states that after she finished the anti-biotics she began to have vaginal itching and burning with urination, she states that she has tried about 5 days of monistat cream wtihout relief and 2 diflucan pills.  She reports burning to labia with urination, white thick discharge, but denies abdominal pain, back or flank pain, nausea, vomiting, diarrhea, contipation.  She also denies nausea, or vomiting.  Reports no recent intercourse (2 years).  Patient is a 53 y.o. female presenting with urinary tract infection and vaginal discharge. The history is provided by the patient. No language interpreter was used.  Urinary Tract Infection This is a new problem. The current episode started in the past 7 days. The problem occurs constantly. The problem has been unchanged. Associated symptoms include congestion, coughing, myalgias and urinary symptoms. Pertinent negatives include no abdominal pain, anorexia, chest pain, fever, headaches, nausea, neck pain, sore throat, swollen glands, vomiting or weakness. Nothing aggravates the symptoms. She has tried nothing for the symptoms. The treatment provided no relief.  Vaginal Discharge Associated symptoms: no abdominal pain, no fever, no nausea and no vomiting     Past Medical History  Diagnosis Date  . Gout   . GERD (gastroesophageal reflux disease)   . Anxiety   . Arthritis   . Depression   . Osteoporosis   . History of rheumatic fever   . DDD (degenerative disc disease), cervical   . Hyperlipidemia   . Carpal tunnel syndrome on  both sides   . Hx of hepatitis C   . Constipation   . PONV (postoperative nausea and vomiting)    Past Surgical History  Procedure Laterality Date  . Abdominal hysterectomy    . Carpal tunnel release      right wrist X2  . Cholecystectomy    . Carpal tunnel release Left 02/23/2013    Procedure: CARPAL TUNNEL RELEASE;  Surgeon: Carole Civil, MD;  Location: [facility];  Service: Orthopedics;  Laterality: Left;   Family History  Problem Relation Age of Onset  . Hypertension Mother   . Hyperlipidemia Mother   . Heart disease Father    History  Substance Use Topics  . Smoking status: Current Every Day Smoker -- 0.50 packs/day for 10 years    Types: Cigarettes  . Smokeless tobacco: Never Used  . Alcohol Use: No   OB History   Grav Para Term Preterm Abortions TAB SAB Ect Mult Living                 Review of Systems  Constitutional: Negative for fever.  HENT: Positive for congestion. Negative for sore throat.   Respiratory: Positive for cough.   Cardiovascular: Negative for chest pain.  Gastrointestinal: Negative for nausea, vomiting, abdominal pain and anorexia.  Genitourinary: Positive for vaginal discharge.  Musculoskeletal: Positive for myalgias. Negative for neck pain.  Neurological: Negative for weakness and headaches.  All other systems reviewed and are negative.    Allergies  Nitrofurantoin monohyd macro  Home Medications   Current Outpatient Rx  Name  Route  Sig  Dispense  Refill  .  buPROPion (WELLBUTRIN XL) 150 MG 24 hr tablet   Oral   Take 1 tablet by mouth daily.         . citalopram (CELEXA) 40 MG tablet   Oral   Take 40 mg by mouth daily.           Marland Kitchen dextromethorphan-guaiFENesin (MUCINEX DM) 30-600 MG per 12 hr tablet   Oral   Take 2 tablets by mouth 3 (three) times daily.         Marland Kitchen docusate sodium (COLACE) 100 MG capsule   Oral   Take 100 mg by mouth 2 (two) times daily.         . fluconazole (DIFLUCAN) 150 MG tablet   Oral    Take 1 tablet by mouth daily.         . Miconazole Nitrate (MONISTAT 1 COMBO PACK VA)   Vaginal   Place 1 application vaginally daily as needed (infection).         . pantoprazole (PROTONIX) 40 MG tablet   Oral   Take 1 tablet by mouth daily.         . polyethylene glycol (MIRALAX / GLYCOLAX) packet   Oral   Take 17 g by mouth 2 (two) times daily.         . simvastatin (ZOCOR) 40 MG tablet   Oral   Take 40 mg by mouth at bedtime.           . traZODone (DESYREL) 150 MG tablet   Oral   Take 150 mg by mouth at bedtime.           Marland Kitchen omeprazole (PRILOSEC) 20 MG capsule   Oral   Take 20 mg by mouth daily.          . phenazopyridine (PYRIDIUM) 200 MG tablet   Oral   Take 1 tablet (200 mg total) by mouth 3 (three) times daily.   6 tablet   0   . sulfamethoxazole-trimethoprim (SEPTRA DS) 800-160 MG per tablet   Oral   Take 1 tablet by mouth every 12 (twelve) hours.   10 tablet   0    BP 124/74  Pulse 99  Temp(Src) 98.2 F (36.8 C) (Oral)  Resp 18  Ht 4\' 11"  (1.499 m)  Wt 135 lb (61.236 kg)  BMI 27.25 kg/m2  SpO2 99% Physical Exam  Nursing note and vitals reviewed. Constitutional: She is oriented to person, place, and time. She appears well-developed and well-nourished. No distress.  HENT:  Head: Normocephalic and atraumatic.  Right Ear: External ear normal.  Left Ear: External ear normal.  Mouth/Throat: Oropharynx is clear and moist. No oropharyngeal exudate.  Boggy nasal mucosa with clear rhinorrhea  Eyes: Conjunctivae are normal. Pupils are equal, round, and reactive to light. No scleral icterus.  Neck: Normal range of motion. Neck supple.  Cardiovascular: Normal rate, regular rhythm and normal heart sounds.  Exam reveals no gallop and no friction rub.   No murmur heard. Pulmonary/Chest: Effort normal and breath sounds normal. No respiratory distress. She has no wheezes. She has no rales. She exhibits no tenderness.  Dry cough on exam  Abdominal:  Soft. Bowel sounds are normal. She exhibits no distension. There is no tenderness. There is no rebound and no guarding.  Genitourinary: There is no rash, tenderness or lesion on the right labia. There is no rash, tenderness or lesion on the left labia. Cervix exhibits no motion tenderness, no discharge and no friability. Right adnexum displays no  mass and no tenderness. Left adnexum displays no mass and no tenderness. No tenderness or bleeding around the vagina. No vaginal discharge found.  Uterus absent  Musculoskeletal: Normal range of motion. She exhibits no edema and no tenderness.  Lymphadenopathy:    She has no cervical adenopathy.  Neurological: She is alert and oriented to person, place, and time. She exhibits normal muscle tone. Coordination normal.  Skin: Skin is warm and dry. No rash noted. No erythema. No pallor.  Psychiatric: She has a normal mood and affect. Her behavior is normal. Judgment and thought content normal.    ED Course  Procedures (including critical care time) Labs Review Labs Reviewed  WET PREP, GENITAL - Abnormal; Notable for the following:    WBC, Wet Prep HPF POC FEW (*)    All other components within normal limits  URINALYSIS, ROUTINE W REFLEX MICROSCOPIC - Abnormal; Notable for the following:    Leukocytes, UA SMALL (*)    All other components within normal limits  URINE MICROSCOPIC-ADD ON - Abnormal; Notable for the following:    Squamous Epithelial / LPF FEW (*)    Bacteria, UA FEW (*)    Crystals CA OXALATE CRYSTALS (*)    All other components within normal limits  GC/CHLAMYDIA PROBE AMP  URINE CULTURE   Imaging Review No results found.  EKG Interpretation   None      Results for orders placed during the hospital encounter of 05/19/13  WET PREP, GENITAL      Result Value Range   Yeast Wet Prep HPF POC NONE SEEN  NONE SEEN   Trich, Wet Prep NONE SEEN  NONE SEEN   Clue Cells Wet Prep HPF POC NONE SEEN  NONE SEEN   WBC, Wet Prep HPF POC FEW  (*) NONE SEEN  URINALYSIS, ROUTINE W REFLEX MICROSCOPIC      Result Value Range   Color, Urine YELLOW  YELLOW   APPearance CLEAR  CLEAR   Specific Gravity, Urine 1.020  1.005 - 1.030   pH 6.0  5.0 - 8.0   Glucose, UA NEGATIVE  NEGATIVE mg/dL   Hgb urine dipstick NEGATIVE  NEGATIVE   Bilirubin Urine NEGATIVE  NEGATIVE   Ketones, ur NEGATIVE  NEGATIVE mg/dL   Protein, ur NEGATIVE  NEGATIVE mg/dL   Urobilinogen, UA 0.2  0.0 - 1.0 mg/dL   Nitrite NEGATIVE  NEGATIVE   Leukocytes, UA SMALL (*) NEGATIVE  URINE MICROSCOPIC-ADD ON      Result Value Range   Squamous Epithelial / LPF FEW (*) RARE   WBC, UA 11-20  <3 WBC/hpf   RBC / HPF 0-2  <3 RBC/hpf   Bacteria, UA FEW (*) RARE   Crystals CA OXALATE CRYSTALS (*) NEGATIVE   No results found.   MDM   1. UTI (urinary tract infection)   2. Bronchitis    Patient here with vaginal discharge and burning with urination - UTI, wet prep without yeast or BV, cultures sent, doubt PID, TOA, ovarian torsion.  Also with likely viral bronchitis, she is a smoker but there is no wheezing noted - will continue supportive treatment.    Idalia Needle Joelyn Oms, PA-C 05/19/13 1553

## 2013-05-19 NOTE — ED Notes (Signed)
Pt was placed on antibiotic for tooth pain. Began having vaginal itching and burning with urination after that. Took diflucan and monistat 7 with no relief. Also complaining of "head cold" x 1 week

## 2013-05-20 LAB — URINE CULTURE: Colony Count: 7000

## 2013-05-21 LAB — GC/CHLAMYDIA PROBE AMP
CT Probe RNA: NEGATIVE
GC Probe RNA: NEGATIVE

## 2013-12-30 ENCOUNTER — Emergency Department (HOSPITAL_COMMUNITY)
Admission: EM | Admit: 2013-12-30 | Discharge: 2013-12-30 | Disposition: A | Discharge status: Home / Self Care | Payer: Medicare HMO | Attending: Emergency Medicine | Admitting: Emergency Medicine

## 2013-12-30 ENCOUNTER — Encounter (HOSPITAL_COMMUNITY): Payer: Self-pay | Admitting: Emergency Medicine

## 2013-12-30 ENCOUNTER — Emergency Department (HOSPITAL_COMMUNITY): Payer: Medicare HMO

## 2013-12-30 DIAGNOSIS — Z8619 Personal history of other infectious and parasitic diseases: Secondary | ICD-10-CM | POA: Insufficient documentation

## 2013-12-30 DIAGNOSIS — H44619 Retained (old) magnetic foreign body in anterior chamber, unspecified eye: Secondary | ICD-10-CM | POA: Diagnosis not present

## 2013-12-30 DIAGNOSIS — Z9089 Acquired absence of other organs: Secondary | ICD-10-CM | POA: Diagnosis not present

## 2013-12-30 DIAGNOSIS — M129 Arthropathy, unspecified: Secondary | ICD-10-CM | POA: Diagnosis not present

## 2013-12-30 DIAGNOSIS — R109 Unspecified abdominal pain: Secondary | ICD-10-CM | POA: Diagnosis present

## 2013-12-30 DIAGNOSIS — F329 Major depressive disorder, single episode, unspecified: Secondary | ICD-10-CM | POA: Diagnosis not present

## 2013-12-30 DIAGNOSIS — K219 Gastro-esophageal reflux disease without esophagitis: Secondary | ICD-10-CM | POA: Insufficient documentation

## 2013-12-30 DIAGNOSIS — F411 Generalized anxiety disorder: Secondary | ICD-10-CM | POA: Diagnosis not present

## 2013-12-30 DIAGNOSIS — R110 Nausea: Secondary | ICD-10-CM | POA: Insufficient documentation

## 2013-12-30 DIAGNOSIS — Z8639 Personal history of other endocrine, nutritional and metabolic disease: Secondary | ICD-10-CM | POA: Diagnosis not present

## 2013-12-30 DIAGNOSIS — K5900 Constipation, unspecified: Secondary | ICD-10-CM

## 2013-12-30 DIAGNOSIS — Z862 Personal history of diseases of the blood and blood-forming organs and certain disorders involving the immune mechanism: Secondary | ICD-10-CM | POA: Insufficient documentation

## 2013-12-30 DIAGNOSIS — Z87442 Personal history of urinary calculi: Secondary | ICD-10-CM | POA: Diagnosis not present

## 2013-12-30 DIAGNOSIS — F3289 Other specified depressive episodes: Secondary | ICD-10-CM | POA: Insufficient documentation

## 2013-12-30 DIAGNOSIS — Z8669 Personal history of other diseases of the nervous system and sense organs: Secondary | ICD-10-CM | POA: Diagnosis not present

## 2013-12-30 DIAGNOSIS — Z90710 Acquired absence of both cervix and uterus: Secondary | ICD-10-CM | POA: Diagnosis not present

## 2013-12-30 DIAGNOSIS — E785 Hyperlipidemia, unspecified: Secondary | ICD-10-CM | POA: Diagnosis not present

## 2013-12-30 LAB — BASIC METABOLIC PANEL
Anion gap: 12 (ref 5–15)
BUN: 9 mg/dL (ref 6–23)
CO2: 27 mEq/L (ref 19–32)
Calcium: 9.1 mg/dL (ref 8.4–10.5)
Chloride: 105 mEq/L (ref 96–112)
Creatinine, Ser: 0.67 mg/dL (ref 0.50–1.10)
GFR calc Af Amer: >90 mL/min (ref >90)
GFR calc non Af Amer: >90 mL/min (ref >90)
Glucose, Bld: 94 mg/dL (ref 70–99)
Potassium: 3.8 mEq/L (ref 3.7–5.3)
Sodium: 144 mEq/L (ref 137–147)

## 2013-12-30 LAB — URINALYSIS, ROUTINE W REFLEX MICROSCOPIC
Bilirubin Urine: NEGATIVE
Glucose, UA: NEGATIVE mg/dL
Hgb urine dipstick: NEGATIVE
Ketones, ur: NEGATIVE mg/dL
Leukocytes, UA: NEGATIVE
Nitrite: NEGATIVE
Protein, ur: NEGATIVE mg/dL
Specific Gravity, Urine: <1.005 — ABNORMAL LOW (ref 1.005–1.030)
Urobilinogen, UA: 0.2 mg/dL (ref 0.0–1.0)
pH: 6.5 (ref 5.0–8.0)

## 2013-12-30 LAB — CBC WITH DIFFERENTIAL/PLATELET
Basophils Absolute: 0 10*3/uL (ref 0.0–0.1)
Basophils Relative: 0 % (ref 0–1)
Eosinophils Absolute: 0.5 10*3/uL (ref 0.0–0.7)
Eosinophils Relative: 4 % (ref 0–5)
HCT: 36 % (ref 36.0–46.0)
Hemoglobin: 12.3 g/dL (ref 12.0–15.0)
Lymphocytes Relative: 22 % (ref 12–46)
Lymphs Abs: 2.4 10*3/uL (ref 0.7–4.0)
MCH: 30.6 pg (ref 26.0–34.0)
MCHC: 34.2 g/dL (ref 30.0–36.0)
MCV: 89.6 fL (ref 78.0–100.0)
Monocytes Absolute: 0.6 10*3/uL (ref 0.1–1.0)
Monocytes Relative: 5 % (ref 3–12)
Neutro Abs: 7.6 10*3/uL (ref 1.7–7.7)
Neutrophils Relative %: 69 % (ref 43–77)
Platelets: 337 10*3/uL (ref 150–400)
RBC: 4.02 MIL/uL (ref 3.87–5.11)
RDW: 13.2 % (ref 11.5–15.5)
WBC: 11.1 10*3/uL — ABNORMAL HIGH (ref 4.0–10.5)

## 2013-12-30 MED ORDER — IOHEXOL 300 MG/ML  SOLN
50.0000 mL | Freq: Once | INTRAMUSCULAR | Status: AC | PRN
  Administered 2013-12-30: 50 mL via ORAL

## 2013-12-30 MED ORDER — TRAMADOL HCL 50 MG PO TABS: 50.0000 mg | ORAL_TABLET | Freq: Four times a day (QID) | ORAL | Status: DC | PRN

## 2013-12-30 MED ORDER — IOHEXOL 300 MG/ML  SOLN
100.0000 mL | Freq: Once | INTRAMUSCULAR | Status: AC | PRN
  Administered 2013-12-30: 100 mL via INTRAVENOUS

## 2013-12-30 MED ORDER — SODIUM CHLORIDE 0.9 % IV BOLUS (SEPSIS)
500.0000 mL | Freq: Once | INTRAVENOUS | Status: AC
  Administered 2013-12-30: 500 mL via INTRAVENOUS

## 2013-12-30 NOTE — ED Notes (Signed)
Pt c/o right flank pain that radiates around to right lower abd area with nausea,  has hx of kidney stones,

## 2013-12-30 NOTE — Discharge Instructions (Signed)
Constipation °Constipation is when a person has fewer than three bowel movements a week, has difficulty having a bowel movement, or has stools that are dry, hard, or larger than normal. As people grow older, constipation is more common. If you try to fix constipation with medicines that make you have a bowel movement (laxatives), the problem may get worse. Long-term laxative use may cause the muscles of the colon to become weak. A low-fiber diet, not taking in enough fluids, and taking certain medicines may make constipation worse.  °CAUSES  °· Certain medicines, such as antidepressants, pain medicine, iron supplements, antacids, and water pills.   °· Certain diseases, such as diabetes, irritable bowel syndrome (IBS), thyroid disease, or depression.   °· Not drinking enough water.   °· Not eating enough fiber-rich foods.   °· Stress or travel.   °· Lack of physical activity or exercise.   °· Ignoring the urge to have a bowel movement.   °· Using laxatives too much.   °SIGNS AND SYMPTOMS  °· Having fewer than three bowel movements a week.   °· Straining to have a bowel movement.   °· Having stools that are hard, dry, or larger than normal.   °· Feeling full or bloated.   °· Pain in the lower abdomen.   °· Not feeling relief after having a bowel movement.   °DIAGNOSIS  °Your health care provider will take a medical history and perform a physical exam. Further testing may be done for severe constipation. Some tests may include: °· A barium enema X-ray to examine your rectum, colon, and, sometimes, your small intestine.   °· A sigmoidoscopy to examine your lower colon.   °· A colonoscopy to examine your entire colon. °TREATMENT  °Treatment will depend on the severity of your constipation and what is causing it. Some dietary treatments include drinking more fluids and eating more fiber-rich foods. Lifestyle treatments may include regular exercise. If these diet and lifestyle recommendations do not help, your health care  provider may recommend taking over-the-counter laxative medicines to help you have bowel movements. Prescription medicines may be prescribed if over-the-counter medicines do not work.  °HOME CARE INSTRUCTIONS  °· Eat foods that have a lot of fiber, such as fruits, vegetables, whole grains, and beans. °· Limit foods high in fat and processed sugars, such as french fries, hamburgers, cookies, candies, and soda.   °· A fiber supplement may be added to your diet if you cannot get enough fiber from foods.   °· Drink enough fluids to keep your urine clear or pale yellow.   °· Exercise regularly or as directed by your health care provider.   °· Go to the restroom when you have the urge to go. Do not hold it.   °· Only take over-the-counter or prescription medicines as directed by your health care provider. Do not take other medicines for constipation without talking to your health care provider first.   °SEEK IMMEDIATE MEDICAL CARE IF:  °· You have bright red blood in your stool.   °· Your constipation lasts for more than 4 days or gets worse.   °· You have abdominal or rectal pain.   °· You have thin, pencil-like stools.   °· You have unexplained weight loss. °MAKE SURE YOU:  °· Understand these instructions. °· Will watch your condition. °· Will get help right away if you are not doing well or get worse. °Document Released: 01/23/2004 Document Revised: 05/01/2013 Document Reviewed: 02/05/2013 °[employer]® Patient Information ©2015 [employer]. This information is not intended to replace advice given to you by your health care provider. Make sure you discuss any questions   you have with your health care provider.  Flank Pain Flank pain refers to pain that is located on the side of the body between the upper abdomen and the back. The pain may occur over a short period of time (acute) or may be long-term or reoccurring (chronic). It may be mild or severe. Flank pain can be caused by many things. CAUSES  Some of the more  common causes of flank pain include:  Muscle strains.   Muscle spasms.   A disease of your spine (vertebral disk disease).   A lung infection (pneumonia).   Fluid around your lungs (pulmonary edema).   A kidney infection.   Kidney stones.   A very painful skin rash caused by the chickenpox virus (shingles).   Gallbladder disease.  [place] care will depend on the cause of your pain. In general,  Rest as directed by your caregiver.  Drink enough fluids to keep your urine clear or pale yellow.  Only take over-the-counter or prescription medicines as directed by your caregiver. Some medicines may help relieve the pain.  Tell your caregiver about any changes in your pain.  Follow up with your caregiver as directed. SEEK IMMEDIATE MEDICAL CARE IF:   Your pain is not controlled with medicine.   You have new or worsening symptoms.  Your pain increases.   You have abdominal pain.   You have shortness of breath.   You have persistent nausea or vomiting.   You have swelling in your abdomen.   You feel faint or pass out.   You have blood in your urine.  You have a fever or persistent symptoms for more than 2-3 days.  You have a fever and your symptoms suddenly get worse. MAKE SURE YOU:   Understand these instructions.  Will watch your condition.  Will get help right away if you are not doing well or get worse. Document Released: 06/17/2005 Document Revised: 01/19/2012 Document Reviewed: 12/09/2011 [facility] Patient Information 2015 [place], [place]. This information is not intended to replace advice given to you by your health care provider. Make sure you discuss any questions you have with your health care provider.

## 2014-01-01 NOTE — ED Provider Notes (Signed)
CSN: [id number]     Arrival date & time 12/30/13  1319 History   First MD Initiated Contact with Patient 12/30/13 1346     Chief Complaint  Patient presents with  . Flank Pain     (Consider location/radiation/quality/duration/timing/severity/associated sxs/prior Treatment) Patient is a 53 y.o. female presenting with flank pain. The history is provided by the patient.  Flank Pain This is a new problem. Associated symptoms include abdominal pain. Pertinent negatives include no chest pain, no headaches and no shortness of breath.   patient with right-sided flank pain. Has had on and off for a couple days. Described as crampy. Has had some nausea with it. She denies diarrhea or constipation. No fevers. No dysuria. She's previously been told she has kidney stone. No relief with medicines at home.  Past Medical History  Diagnosis Date  . Gout   . GERD (gastroesophageal reflux disease)   . Anxiety   . Arthritis   . Depression   . Osteoporosis   . History of rheumatic fever   . DDD (degenerative disc disease), cervical   . Hyperlipidemia   . Carpal tunnel syndrome on both sides   . Hx of hepatitis C   . Constipation   . PONV (postoperative nausea and vomiting)   . Kidney stones    Past Surgical History  Procedure Laterality Date  . Abdominal hysterectomy    . Carpal tunnel release      right wrist X2  . Cholecystectomy    . Carpal tunnel release Left 02/23/2013    Procedure: CARPAL TUNNEL RELEASE;  Surgeon: Carole Civil, MD;  Location: [facility];  Service: Orthopedics;  Laterality: Left;   Family History  Problem Relation Age of Onset  . Hypertension Mother   . Hyperlipidemia Mother   . Heart disease Father    History  Substance Use Topics  . Smoking status: Current Every Day Smoker -- 0.50 packs/day for 10 years    Types: Cigarettes  . Smokeless tobacco: Never Used  . Alcohol Use: No   OB History   Grav Para Term Preterm Abortions TAB SAB Ect Mult Living        Review of Systems  Constitutional: Negative for activity change and appetite change.  Eyes: Negative for pain.  Respiratory: Negative for chest tightness and shortness of breath.   Cardiovascular: Negative for chest pain and leg swelling.  Gastrointestinal: Positive for nausea and abdominal pain. Negative for vomiting and diarrhea.  Genitourinary: Positive for flank pain.  Musculoskeletal: Negative for back pain and neck stiffness.  Skin: Negative for rash.  Neurological: Negative for weakness, numbness and headaches.  Psychiatric/Behavioral: Negative for behavioral problems.      Allergies  Nitrofurantoin monohyd macro  Home Medications   Prior to Admission medications   Medication Sig Start Date End Date Taking? Authorizing Provider  acetaminophen (TYLENOL) 500 MG tablet Take 1,000 mg by mouth every 6 (six) hours as needed for moderate pain.   Yes Historical Provider, MD  buPROPion (WELLBUTRIN XL) 150 MG 24 hr tablet Take 1 tablet by mouth daily. 05/16/13  Yes Historical Provider, MD  citalopram (CELEXA) 40 MG tablet Take 40 mg by mouth daily.     Yes Historical Provider, MD  docusate sodium (COLACE) 100 MG capsule Take 100 mg by mouth 2 (two) times daily.   Yes Historical Provider, MD  fluconazole (DIFLUCAN) 150 MG tablet Take 1 tablet by mouth daily. 05/16/13  Yes Historical Provider, MD  pantoprazole (PROTONIX) 40 MG tablet Take  1 tablet by mouth daily. 05/14/13  Yes Historical Provider, MD  polyethylene glycol (MIRALAX / GLYCOLAX) packet Take 17 g by mouth 2 (two) times daily.   Yes Historical Provider, MD  simvastatin (ZOCOR) 40 MG tablet Take 40 mg by mouth at bedtime.     Yes Historical Provider, MD  traZODone (DESYREL) 150 MG tablet Take 150 mg by mouth at bedtime.     Yes Historical Provider, MD  traMADol (ULTRAM) 50 MG tablet Take 1 tablet (50 mg total) by mouth every 6 (six) hours as needed. 12/30/13   Jasper Riling. Nolia Tschantz, MD   BP 128/74  Pulse 85  Temp(Src) 99.1 F  (37.3 C) (Oral)  Resp 16  Ht 4\' 11"  (1.499 m)  Wt 150 lb (68.04 kg)  BMI 30.28 kg/m2  SpO2 100% Physical Exam  Nursing note and vitals reviewed. Constitutional: She is oriented to person, place, and time. She appears well-developed and well-nourished.  HENT:  Head: Normocephalic and atraumatic.  Eyes: EOM are normal. Pupils are equal, round, and reactive to light.  Neck: Normal range of motion. Neck supple.  Cardiovascular: Normal rate, regular rhythm and normal heart sounds.   No murmur heard. Pulmonary/Chest: Effort normal and breath sounds normal. No respiratory distress. She has no wheezes. She has no rales.  Abdominal: Soft. Bowel sounds are normal. She exhibits no distension. There is tenderness. There is no rebound and no guarding.  Right lower quadrant tenderness without rebound or guarding.  Musculoskeletal: Normal range of motion.  Neurological: She is alert and oriented to person, place, and time. No cranial nerve deficit.  Skin: Skin is warm and dry.  Psychiatric: She has a normal mood and affect. Her speech is normal.    ED Course  Procedures (including critical care time) Labs Review Labs Reviewed  URINALYSIS, ROUTINE W REFLEX MICROSCOPIC - Abnormal; Notable for the following:    APPearance HAZY (*)    Specific Gravity, Urine <1.005 (*)    All other components within normal limits  CBC WITH DIFFERENTIAL - Abnormal; Notable for the following:    WBC 11.1 (*)    All other components within normal limits  BASIC METABOLIC PANEL    Imaging Review No results found.   EKG Interpretation None      MDM   Final diagnoses:  Flank pain  Constipation, unspecified constipation type    Patient flank pain. CT scan reassuring except for constipation. Does have small stone in kidney that is likely not causing the pain.    Jasper Riling. Alvino Chapel, MD 01/01/14 2127

## 2014-01-10 DIAGNOSIS — R079 Chest pain, unspecified: Secondary | ICD-10-CM | POA: Insufficient documentation

## 2014-01-10 DIAGNOSIS — E785 Hyperlipidemia, unspecified: Secondary | ICD-10-CM | POA: Insufficient documentation

## 2014-04-18 ENCOUNTER — Ambulatory Visit: Payer: Medicare Other | Admitting: Orthopedic Surgery

## 2014-04-25 ENCOUNTER — Ambulatory Visit (INDEPENDENT_AMBULATORY_CARE_PROVIDER_SITE_OTHER): Payer: 59 | Admitting: Orthopedic Surgery

## 2014-04-25 ENCOUNTER — Encounter: Payer: Self-pay | Admitting: Orthopedic Surgery

## 2014-04-25 ENCOUNTER — Ambulatory Visit: Payer: PRIVATE HEALTH INSURANCE

## 2014-04-25 VITALS — BP 141/77 | Ht 59.0 in | Wt 150.0 lb

## 2014-04-25 DIAGNOSIS — M79645 Pain in left finger(s): Secondary | ICD-10-CM

## 2014-04-25 DIAGNOSIS — M65312 Trigger thumb, left thumb: Secondary | ICD-10-CM | POA: Diagnosis not present

## 2014-04-25 NOTE — Progress Notes (Signed)
Patient ID: Sabrina Beasley, female   DOB: December 09, 1960, 53 y.o.   MRN: [id number]  Chief Complaint  Patient presents with  . Hand Pain    left thumb pain and locking, possible trigger finger    New problem  The patient presents with a 3 month history of pain in the left thumb with clicking locking. Pain is 6 out of 10 it's over the A1 pulley with catching and locking in a jumping sensation at the IP joint. Nothing makes it better and nothing makes it worse no trauma has been detected were noted.  Allergies none  Medical history of reflux  Social she does not smoke or drink  Family history of reflux as well. 2014 metacarpal tunnel release medications recorded and reviewed  Review of systems constipation joint pain all other systems negative  Exam well-developed well-nourished female grooming and hygiene are intact she is oriented 3 mood and affect are normal vital signs are stable. She has palpable tenderness over the A1 pulley of the left thumb there is clicking popping and catching joint stability normal flexor tendon strength normal skin normal pulse normal sensation normal epitrochlear lymph nodes at the elbow normal  No x-ray was obtained obvious diagnosis  We recommended injection and she agreed  Encounter Diagnoses  Name Primary?  . Thumb pain, left Yes  . Trigger thumb, left     Procedure note  Injection  Verbal consent was obtained to inject the  left thumb  Timeout procedure was completed to confirm injection site  Diagnosis trigger thumb  Medications used Depo-Medrol 40 mg 1 cc Lidocaine 1% plain 3 cc  Anesthesia was provided by ethyl chloride spray  Prep was performed with alcohol  Technique of injection  we used 2 mL of the above preparation with a 25-gauge needle   No complications were noted

## 2014-08-22 ENCOUNTER — Encounter: Payer: Self-pay | Admitting: Internal Medicine

## 2014-08-28 ENCOUNTER — Other Ambulatory Visit (HOSPITAL_COMMUNITY): Payer: Self-pay | Admitting: Family Medicine

## 2014-08-28 ENCOUNTER — Ambulatory Visit (HOSPITAL_COMMUNITY)
Admission: RE | Admit: 2014-08-28 | Discharge: 2014-08-28 | Disposition: A | Discharge status: Home / Self Care | Payer: Medicare Other | Source: Ambulatory Visit | Attending: Family Medicine | Admitting: Family Medicine

## 2014-08-28 DIAGNOSIS — Z780 Asymptomatic menopausal state: Secondary | ICD-10-CM | POA: Insufficient documentation

## 2014-08-28 DIAGNOSIS — M8580 Other specified disorders of bone density and structure, unspecified site: Secondary | ICD-10-CM | POA: Diagnosis not present

## 2014-08-28 DIAGNOSIS — Z90722 Acquired absence of ovaries, bilateral: Secondary | ICD-10-CM | POA: Insufficient documentation

## 2014-09-13 ENCOUNTER — Telehealth: Payer: Self-pay | Admitting: Gastroenterology

## 2014-09-13 ENCOUNTER — Ambulatory Visit: Payer: Medicare Other | Admitting: Gastroenterology

## 2014-09-13 ENCOUNTER — Encounter: Payer: Self-pay | Admitting: Gastroenterology

## 2014-09-13 NOTE — Telephone Encounter (Signed)
Pt was a no show

## 2014-11-14 ENCOUNTER — Ambulatory Visit (INDEPENDENT_AMBULATORY_CARE_PROVIDER_SITE_OTHER): Payer: Medicare Other | Admitting: Otolaryngology

## 2014-12-19 ENCOUNTER — Ambulatory Visit (INDEPENDENT_AMBULATORY_CARE_PROVIDER_SITE_OTHER): Payer: Medicare Other | Admitting: Orthopedic Surgery

## 2014-12-19 DIAGNOSIS — M65312 Trigger thumb, left thumb: Secondary | ICD-10-CM

## 2014-12-19 NOTE — Progress Notes (Signed)
Patient ID: Panagiota Perfetti, female   DOB: 1961/03/20, 54 y.o.   MRN: [id number]   Left Trigger thumb injection Medication  1 mL of 40 mg Depo-Medrol  2 mL of 1% lidocaine plain  Ethyl chloride for anesthesia  Verbal consent was obtained timeout was taken to confirm the injection site as left thumb  Alcohol was used to prepare the skin along with ethyl chloride and then the injection was made at the A1 pulley there were no complications

## 2015-02-17 ENCOUNTER — Ambulatory Visit: Payer: Medicare Other | Admitting: Gastroenterology

## 2015-03-05 ENCOUNTER — Ambulatory Visit: Payer: Medicare Other | Admitting: Gastroenterology

## 2015-03-05 ENCOUNTER — Encounter: Payer: Self-pay | Admitting: Gastroenterology

## 2015-03-05 ENCOUNTER — Telehealth: Payer: Self-pay | Admitting: Gastroenterology

## 2015-03-05 NOTE — Telephone Encounter (Signed)
PT WAS A NO SHOW AND LETTER SENT  °

## 2015-06-03 ENCOUNTER — Other Ambulatory Visit (HOSPITAL_COMMUNITY): Payer: Self-pay | Admitting: Sports Medicine

## 2015-06-03 DIAGNOSIS — Z1231 Encounter for screening mammogram for malignant neoplasm of breast: Secondary | ICD-10-CM

## 2015-06-11 ENCOUNTER — Ambulatory Visit (HOSPITAL_COMMUNITY)
Admission: RE | Admit: 2015-06-11 | Discharge: 2015-06-11 | Disposition: A | Discharge status: Home / Self Care | Payer: Medicare Other | Source: Ambulatory Visit | Attending: Sports Medicine | Admitting: Sports Medicine

## 2015-06-11 ENCOUNTER — Other Ambulatory Visit: Payer: Self-pay | Admitting: Neurology

## 2015-06-11 DIAGNOSIS — Z1231 Encounter for screening mammogram for malignant neoplasm of breast: Secondary | ICD-10-CM | POA: Diagnosis not present

## 2015-06-11 DIAGNOSIS — M5020 Other cervical disc displacement, unspecified cervical region: Secondary | ICD-10-CM

## 2015-06-12 ENCOUNTER — Encounter: Payer: Self-pay | Admitting: Cardiovascular Disease

## 2015-06-12 ENCOUNTER — Ambulatory Visit (INDEPENDENT_AMBULATORY_CARE_PROVIDER_SITE_OTHER): Payer: Medicare Other | Admitting: Cardiovascular Disease

## 2015-06-12 VITALS — BP 110/68 | HR 68 | Ht 59.0 in | Wt 161.0 lb

## 2015-06-12 DIAGNOSIS — E785 Hyperlipidemia, unspecified: Secondary | ICD-10-CM

## 2015-06-12 DIAGNOSIS — R002 Palpitations: Secondary | ICD-10-CM

## 2015-06-12 DIAGNOSIS — R072 Precordial pain: Secondary | ICD-10-CM

## 2015-06-12 DIAGNOSIS — R0600 Dyspnea, unspecified: Secondary | ICD-10-CM

## 2015-06-12 DIAGNOSIS — R0609 Other forms of dyspnea: Secondary | ICD-10-CM | POA: Diagnosis not present

## 2015-06-12 DIAGNOSIS — Z8249 Family history of ischemic heart disease and other diseases of the circulatory system: Secondary | ICD-10-CM

## 2015-06-12 NOTE — Progress Notes (Signed)
Patient ID: Sabrina Beasley, female   DOB: [birth year], 55 y.o.   MRN: [id number]       CARDIOLOGY CONSULT NOTE  Patient ID: Sabrina Beasley MRN: [id number] DOB/AGE: 1961-04-29 55 y.o.  Admit date: (Not on file) Primary Physician Jana Half  Reason for Consultation: palpitations  HPI: The patient is a 55 year old woman with a history of hyperlipidemia, anxiety and depression , who is referred for the evaluation of palpitations.  she has noticed that her blood pressure and heart rate have been elevated over the past several months. She was recently started on atenolol for the past 2 weeks. She has a history of rheumatic fever as a child. She has also been experiencing chest pain both with exertion and at rest intermittently for the past one year. She has also had progressive exertional dyspnea over the past year. She denies orthopnea, paroxysmal nocturnal dyspnea, lightheadedness, dizziness , leg swelling , and syncope. She smokes a half pack of cigarettes daily.  ECG performed in the office today demonstrates normal sinus rhythm with no ischemic ST segment or T-wave abnormalities, nor any arrhythmias.   Fam: Father died of MI at 19, 2 brothers died in early 65's of MI's.    Allergies  Allergen Reactions  . Nitrofurantoin Monohyd Macro Nausea And Vomiting    Also gets shaky  . Sulfa Antibiotics Rash    Current Outpatient Prescriptions  Medication Sig Dispense Refill  . acetaminophen (TYLENOL) 500 MG tablet Take 1,000 mg by mouth every 6 (six) hours as needed for moderate pain.    Marland Kitchen atenolol (TENORMIN) 25 MG tablet Take 25 mg by mouth daily.    . citalopram (CELEXA) 40 MG tablet Take 40 mg by mouth daily.      . fluconazole (DIFLUCAN) 150 MG tablet Take 1 tablet by mouth daily.    . pantoprazole (PROTONIX) 40 MG tablet Take 1 tablet by mouth daily.    . polyethylene glycol (MIRALAX / GLYCOLAX) packet Take 17 g by mouth 2 (two) times daily.    . simvastatin (ZOCOR) 40 MG tablet  Take 40 mg by mouth at bedtime.      . traZODone (DESYREL) 150 MG tablet Take 150 mg by mouth at bedtime.       No current facility-administered medications for this visit.    Past Medical History  Diagnosis Date  . Gout   . GERD (gastroesophageal reflux disease)   . Anxiety   . Arthritis   . Depression   . Osteoporosis   . History of rheumatic fever   . DDD (degenerative disc disease), cervical   . Hyperlipidemia   . Carpal tunnel syndrome on both sides   . Hx of hepatitis C   . Constipation   . PONV (postoperative nausea and vomiting)   . Kidney stones     Past Surgical History  Procedure Laterality Date  . Abdominal hysterectomy    . Carpal tunnel release      right wrist X2  . Cholecystectomy    . Carpal tunnel release Left 02/23/2013    Procedure: CARPAL TUNNEL RELEASE;  Surgeon: Carole Civil, MD;  Location: [facility];  Service: Orthopedics;  Laterality: Left;    Social History   Social History  . Marital Status: Married    Spouse Name: N/A  . Number of Children: N/A  . Years of Education: N/A   Occupational History  . Not on file.   Social History Main Topics  . Smoking status: Current Every Day Smoker --  0.50 packs/day for 10 years    Types: Cigarettes    Start date: 05/10/1978  . Smokeless tobacco: Never Used  . Alcohol Use: No  . Drug Use: No  . Sexual Activity: Not Currently    Birth Control/ Protection: Surgical   Other Topics Concern  . Not on file   Social History Narrative       Prior to Admission medications   Medication Sig Start Date End Date Taking? Authorizing Provider  acetaminophen (TYLENOL) 500 MG tablet Take 1,000 mg by mouth every 6 (six) hours as needed for moderate pain.    Historical Provider, MD  buPROPion (WELLBUTRIN XL) 150 MG 24 hr tablet Take 1 tablet by mouth daily. 05/16/13   Historical Provider, MD  citalopram (CELEXA) 40 MG tablet Take 40 mg by mouth daily.      Historical Provider, MD  docusate sodium (COLACE)  100 MG capsule Take 100 mg by mouth 2 (two) times daily.    Historical Provider, MD  fluconazole (DIFLUCAN) 150 MG tablet Take 1 tablet by mouth daily. 05/16/13   Historical Provider, MD  pantoprazole (PROTONIX) 40 MG tablet Take 1 tablet by mouth daily. 05/14/13   Historical Provider, MD  polyethylene glycol (MIRALAX / GLYCOLAX) packet Take 17 g by mouth 2 (two) times daily.    Historical Provider, MD  simvastatin (ZOCOR) 40 MG tablet Take 40 mg by mouth at bedtime.      Historical Provider, MD  traMADol (ULTRAM) 50 MG tablet Take 1 tablet (50 mg total) by mouth every 6 (six) hours as needed. Patient not taking: Reported on 04/25/2014 12/30/13   Davonna Belling, MD  traZODone (DESYREL) 150 MG tablet Take 150 mg by mouth at bedtime.      Historical Provider, MD     Review of systems complete and found to be negative unless listed above in HPI     Physical exam Blood pressure 110/68, pulse 68, height 4\' 11"  (1.499 m), weight 161 lb (73.029 kg), SpO2 97 %. General: NAD Neck: No JVD, no thyromegaly or thyroid nodule.  Lungs: Clear to auscultation bilaterally with normal respiratory effort. CV: Nondisplaced PMI. Regular rate and rhythm, normal S1/S2, no S3/S4, no murmur.  No peripheral edema.  No carotid bruit.  Normal pedal pulses.  Abdomen: Soft, nontender, obese. Skin: Intact without lesions or rashes.  Neurologic: Alert and oriented x 3.  Psych: Normal affect. Extremities: No clubbing or cyanosis.  HEENT: Normal.   ECG: Most recent ECG reviewed.  Labs:   Lab Results  Component Value Date   WBC 11.1* 12/30/2013   HGB 12.3 12/30/2013   HCT 36.0 12/30/2013   MCV 89.6 12/30/2013   PLT 337 12/30/2013   No results for input(s): NA, K, CL, CO2, BUN, CREATININE, CALCIUM, PROT, BILITOT, ALKPHOS, ALT, AST, GLUCOSE in the last 168 hours.  Invalid input(s): LABALBU No results found for: CKTOTAL, CKMB, CKMBINDEX, TROPONINI No results found for: CHOL No results found for: HDL No results  found for: LDLCALC No results found for: TRIG No results found for: CHOLHDL No results found for: LDLDIRECT       Studies: No results found.  ASSESSMENT AND PLAN:  1. Palpitations: Symptomatically stable with recent institution of atenolol. I will order a 2-D echocardiogram with Doppler to evaluate cardiac structure, function, and regional wall motion.  2. Chest pain and progressive exertional dyspnea: Risk factors include family history of premature coronary artery disease, hyperlipidemia, and tobacco abuse. I will obtain an echocardiogram to evaluate cardiac  structure and function and an exercise Cardiolite to evaluate for ischemic heart disease.  3. Hyperlipidemia: Continue simvastatin 40 mg.  Dispo: f/u 6 weeks.   Signed: Kate Sable, M.D., F.A.C.C.  06/12/2015, 10:16 AM

## 2015-06-12 NOTE — Patient Instructions (Signed)
Your physician recommends that you schedule a follow-up appointment in: 6 weeks with Dr Bronson Ing   Your physician has requested that you have an echocardiogram. Echocardiography is a painless test that uses sound waves to create images of your heart. It provides your doctor with information about the size and shape of your heart and how well your heart's chambers and valves are working. This procedure takes approximately one hour. There are no restrictions for this procedure.    Your physician has requested that you have en exercise stress myoview. For further information please visit [email or web address]. Please follow instruction sheet, as given.       Thank you for choosing [facility] !

## 2015-06-13 ENCOUNTER — Other Ambulatory Visit: Payer: Self-pay | Admitting: Sports Medicine

## 2015-06-13 DIAGNOSIS — R928 Other abnormal and inconclusive findings on diagnostic imaging of breast: Secondary | ICD-10-CM

## 2015-06-16 ENCOUNTER — Other Ambulatory Visit: Payer: Self-pay | Admitting: Sports Medicine

## 2015-06-16 DIAGNOSIS — R928 Other abnormal and inconclusive findings on diagnostic imaging of breast: Secondary | ICD-10-CM

## 2015-06-19 ENCOUNTER — Encounter (HOSPITAL_COMMUNITY)
Admission: RE | Admit: 2015-06-19 | Discharge: 2015-06-19 | Disposition: A | Discharge status: Home / Self Care | Payer: Medicare Other | Source: Ambulatory Visit | Attending: Cardiovascular Disease | Admitting: Cardiovascular Disease

## 2015-06-19 ENCOUNTER — Inpatient Hospital Stay (HOSPITAL_COMMUNITY): Admission: RE | Admit: 2015-06-19 | Payer: Medicare Other | Source: Ambulatory Visit

## 2015-06-19 ENCOUNTER — Ambulatory Visit (HOSPITAL_COMMUNITY)
Admission: RE | Admit: 2015-06-19 | Discharge: 2015-06-19 | Disposition: A | Discharge status: Home / Self Care | Payer: Medicare Other | Source: Ambulatory Visit | Attending: Cardiovascular Disease | Admitting: Cardiovascular Disease

## 2015-06-19 ENCOUNTER — Encounter (HOSPITAL_COMMUNITY): Payer: Self-pay

## 2015-06-19 DIAGNOSIS — R002 Palpitations: Secondary | ICD-10-CM | POA: Insufficient documentation

## 2015-06-19 DIAGNOSIS — R072 Precordial pain: Secondary | ICD-10-CM | POA: Insufficient documentation

## 2015-06-19 DIAGNOSIS — R0609 Other forms of dyspnea: Secondary | ICD-10-CM | POA: Insufficient documentation

## 2015-06-19 DIAGNOSIS — R0600 Dyspnea, unspecified: Secondary | ICD-10-CM

## 2015-06-19 DIAGNOSIS — R079 Chest pain, unspecified: Secondary | ICD-10-CM | POA: Diagnosis present

## 2015-06-19 HISTORY — PX: TRANSTHORACIC ECHOCARDIOGRAM: SHX275

## 2015-06-19 LAB — NM MYOCAR MULTI W/SPECT W/WALL MOTION / EF
Estimated workload: 4.6 METS
Exercise duration (min): 3 min
Exercise duration (sec): 44 s
LV dias vol: 73 mL
LV sys vol: 32 mL
MPHR: 168 {beats}/min
Peak HR: 146 {beats}/min
Percent HR: 86 %
RATE: 0.48
RPE: 17
Rest HR: 58 {beats}/min
SDS: 2
SRS: 4
SSS: 6
TID: 1.15

## 2015-06-19 MED ORDER — TECHNETIUM TC 99M SESTAMIBI - CARDIOLITE
10.0000 | Freq: Once | INTRAVENOUS | Status: AC | PRN
  Administered 2015-06-19: 10:00:00 9 via INTRAVENOUS

## 2015-06-19 MED ORDER — REGADENOSON 0.4 MG/5ML IV SOLN
INTRAVENOUS | Status: AC
  Filled 2015-06-19: qty 5

## 2015-06-19 MED ORDER — SODIUM CHLORIDE 0.9% FLUSH
INTRAVENOUS | Status: AC
  Administered 2015-06-19: 10 mL via INTRAVENOUS
  Filled 2015-06-19: qty 10

## 2015-06-19 MED ORDER — TECHNETIUM TC 99M SESTAMIBI GENERIC - CARDIOLITE
30.0000 | Freq: Once | INTRAVENOUS | Status: AC | PRN
  Administered 2015-06-19: 31 via INTRAVENOUS

## 2015-06-20 ENCOUNTER — Ambulatory Visit
Admission: RE | Admit: 2015-06-20 | Discharge: 2015-06-20 | Disposition: A | Discharge status: Home / Self Care | Payer: Medicare Other | Source: Ambulatory Visit | Attending: Sports Medicine | Admitting: Sports Medicine

## 2015-06-20 DIAGNOSIS — R928 Other abnormal and inconclusive findings on diagnostic imaging of breast: Secondary | ICD-10-CM

## 2015-06-26 ENCOUNTER — Ambulatory Visit (HOSPITAL_COMMUNITY)
Admission: RE | Admit: 2015-06-26 | Discharge: 2015-06-26 | Disposition: A | Discharge status: Home / Self Care | Payer: Medicare Other | Source: Ambulatory Visit | Attending: Neurology | Admitting: Neurology

## 2015-06-26 DIAGNOSIS — M50222 Other cervical disc displacement at C5-C6 level: Secondary | ICD-10-CM | POA: Diagnosis not present

## 2015-06-26 DIAGNOSIS — M50323 Other cervical disc degeneration at C6-C7 level: Secondary | ICD-10-CM | POA: Insufficient documentation

## 2015-06-26 DIAGNOSIS — M50221 Other cervical disc displacement at C4-C5 level: Secondary | ICD-10-CM | POA: Insufficient documentation

## 2015-06-26 DIAGNOSIS — M5020 Other cervical disc displacement, unspecified cervical region: Secondary | ICD-10-CM

## 2015-06-26 DIAGNOSIS — M4802 Spinal stenosis, cervical region: Secondary | ICD-10-CM | POA: Insufficient documentation

## 2015-06-26 DIAGNOSIS — M50223 Other cervical disc displacement at C6-C7 level: Secondary | ICD-10-CM | POA: Insufficient documentation

## 2015-07-09 ENCOUNTER — Encounter: Payer: Self-pay | Admitting: Orthopaedic Surgery

## 2015-07-09 ENCOUNTER — Ambulatory Visit (INDEPENDENT_AMBULATORY_CARE_PROVIDER_SITE_OTHER): Payer: Medicare Other | Admitting: Orthopaedic Surgery

## 2015-07-09 ENCOUNTER — Ambulatory Visit (INDEPENDENT_AMBULATORY_CARE_PROVIDER_SITE_OTHER): Payer: Medicare Other

## 2015-07-09 VITALS — BP 133/79 | HR 67 | Temp 98.4°F | Resp 16 | Ht 59.0 in | Wt 162.0 lb

## 2015-07-09 DIAGNOSIS — M25511 Pain in right shoulder: Secondary | ICD-10-CM

## 2015-07-09 MED ORDER — NAPROXEN 500 MG PO TABS: 500.0000 mg | ORAL_TABLET | Freq: Two times a day (BID) | ORAL | Status: DC

## 2015-07-09 MED ORDER — HYDROCODONE-ACETAMINOPHEN 5-325 MG PO TABS: 1.0000 | ORAL_TABLET | ORAL | Status: DC | PRN

## 2015-07-09 NOTE — Patient Instructions (Addendum)
Call [facility] in [place] to schedule therapy visits   Smoking Cessation, Tips for Success If you are ready to quit smoking, congratulations! You have chosen to help yourself be healthier. Cigarettes bring nicotine, tar, carbon monoxide, and other irritants into your body. Your lungs, heart, and blood vessels will be able to work better without these poisons. There are many different ways to quit smoking. Nicotine gum, nicotine patches, a nicotine inhaler, or nicotine nasal spray can help with physical craving. Hypnosis, support groups, and medicines help break the habit of smoking. WHAT THINGS CAN I DO TO MAKE QUITTING EASIER?  Here are some tips to help you quit for good:  Pick a date when you will quit smoking completely. Tell all of your friends and family about your plan to quit on that date.  Do not try to slowly cut down on the number of cigarettes you are smoking. Pick a quit date and quit smoking completely starting on that day.  Throw away all cigarettes.   Clean and remove all ashtrays from your home, work, and car.  On a card, write down your reasons for quitting. Carry the card with you and read it when you get the urge to smoke.  Cleanse your body of nicotine. Drink enough water and fluids to keep your urine clear or pale yellow. Do this after quitting to flush the nicotine from your body.  Learn to predict your moods. Do not let a bad situation be your excuse to have a cigarette. Some situations in your life might tempt you into wanting a cigarette.  Never have "just one" cigarette. It leads to wanting another and another. Remind yourself of your decision to quit.  Change habits associated with smoking. If you smoked while driving or when feeling stressed, try other activities to replace smoking. Stand up when drinking your coffee. Brush your teeth after eating. Sit in a different chair when you read the paper. Avoid alcohol while trying to quit, and try to drink  fewer caffeinated beverages. Alcohol and caffeine may urge you to smoke.  Avoid foods and drinks that can trigger a desire to smoke, such as sugary or spicy foods and alcohol.  Ask people who smoke not to smoke around you.  Have something planned to do right after eating or having a cup of coffee. For example, plan to take a walk or exercise.  Try a relaxation exercise to calm you down and decrease your stress. Remember, you may be tense and nervous for the first 2 weeks after you quit, but this will pass.  Find new activities to keep your hands busy. Play with a pen, coin, or rubber band. Doodle or draw things on paper.  Brush your teeth right after eating. This will help cut down on the craving for the taste of tobacco after meals. You can also try mouthwash.   Use oral substitutes in place of cigarettes. Try using lemon drops, carrots, cinnamon sticks, or chewing gum. Keep them handy so they are available when you have the urge to smoke.  When you have the urge to smoke, try deep breathing.  Designate your home as a nonsmoking area.  If you are a heavy smoker, ask your health care provider about a prescription for nicotine chewing gum. It can ease your withdrawal from nicotine.  Reward yourself. Set aside the cigarette money you save and buy yourself something nice.  Look for support from others. Join a support group or smoking cessation program. Ask someone  at home or at work to help you with your plan to quit smoking.  Always ask yourself, "Do I need this cigarette or is this just a reflex?" Tell yourself, "Today, I choose not to smoke," or "I do not want to smoke." You are reminding yourself of your decision to quit.  Do not replace cigarette smoking with electronic cigarettes (commonly called e-cigarettes). The safety of e-cigarettes is unknown, and some may contain harmful chemicals.  If you relapse, do not give up! Plan ahead and think about what you will do the next time you  get the urge to smoke. HOW WILL I FEEL WHEN I QUIT SMOKING? You may have symptoms of withdrawal because your body is used to nicotine (the addictive substance in cigarettes). You may crave cigarettes, be irritable, feel very hungry, cough often, get headaches, or have difficulty concentrating. The withdrawal symptoms are only temporary. They are strongest when you first quit but will go away within 10-14 days. When withdrawal symptoms occur, stay in control. Think about your reasons for quitting. Remind yourself that these are signs that your body is healing and getting used to being without cigarettes. Remember that withdrawal symptoms are easier to treat than the major diseases that smoking can cause.  Even after the withdrawal is over, expect periodic urges to smoke. However, these cravings are generally short lived and will go away whether you smoke or not. Do not smoke! WHAT RESOURCES ARE AVAILABLE TO HELP ME QUIT SMOKING? Your health care provider can direct you to community resources or hospitals for support, which may include:  Group support.  Education.  Hypnosis.  Therapy.   This information is not intended to replace advice given to you by your health care provider. Make sure you discuss any questions you have with your health care provider.   Document Released: 01/23/2004 Document Revised: 05/17/2014 Document Reviewed: 10/12/2012 Elsevier Interactive Patient Education [employer].

## 2015-07-09 NOTE — Progress Notes (Addendum)
CC:  I have right shoulder pain  Subjective:    Patient ID: Sabrina Beasley, female    DOB: [birth year], 55 y.o.   MRN: [id number]  Neck Pain  The current episode started more than 1 month ago. The problem occurs daily. The problem has been gradually worsening. The quality of the pain is described as aching. The pain is at a severity of 5/10. The pain is moderate. The pain is worse during the day. Pertinent negatives include no chest pain. She has tried acetaminophen, heat, home exercises, ice and NSAIDs for the symptoms. The treatment provided mild relief.   She has had neck and right shoulder pain for about four to five months.  She has been seen by her family doctor and then had a through work-up by a neurosurgeon.  She has had MRI of the cervical spine.  After evaluation, it was decided her pain was from the right shoulder and she was given an appointment here.  She is not getting any better.  She has pain with overhead use.  She has no redness or popping.  Review of Systems  Constitutional:       She has no diabetes. She has no hypertension She has no COPD She has a current smoking history and is willing to quit.  HENT: Negative for congestion.   Respiratory: Negative for cough and shortness of breath.   Cardiovascular: Negative for chest pain.  Endocrine: Positive for cold intolerance.  Musculoskeletal: Positive for myalgias, arthralgias and neck pain.  Allergic/Immunologic: Negative for environmental allergies.   Social History   Social History  . Marital Status: Married    Spouse Name: N/A  . Number of Children: N/A  . Years of Education: N/A   Occupational History  . Not on file.   Social History Main Topics  . Smoking status: Current Every Day Smoker -- 0.50 packs/day for 10 years    Types: Cigarettes    Start date: 05/10/1978  . Smokeless tobacco: Never Used  . Alcohol Use: No  . Drug Use: No  . Sexual Activity: Not Currently    Birth Control/ Protection: Surgical    Other Topics Concern  . Not on file   Social History Narrative   Past Surgical History  Procedure Laterality Date  . Abdominal hysterectomy    . Carpal tunnel release      right wrist X2  . Cholecystectomy    . Carpal tunnel release Left 02/23/2013    Procedure: CARPAL TUNNEL RELEASE;  Surgeon: Carole Civil, MD;  Location: [facility];  Service: Orthopedics;  Laterality: Left;   The patient has a family history of hypertension  BP 133/79 mmHg  Pulse 67  Temp(Src) 98.4 F (36.9 C)  Resp 16  Ht 4\' 11"  (1.499 m)  Wt 162 lb (73.483 kg)  BMI 32.70 kg/m2     Objective:   Physical Exam  Constitutional: She is oriented to person, place, and time. She appears well-developed and well-nourished.  HENT:  Head: Normocephalic and atraumatic.  Eyes: Conjunctivae and EOM are normal. Pupils are equal, round, and reactive to light.  Neck: Normal range of motion. Neck supple.  Cardiovascular: Normal rate, regular rhythm, normal heart sounds and intact distal pulses.   Pulmonary/Chest: Effort normal and breath sounds normal.  Abdominal: Soft.  Musculoskeletal: She exhibits tenderness (pain with motion of right shoulder, NV intact, no crepitus).       Right shoulder: She exhibits decreased range of motion, tenderness and pain.  Arms: Neurological: She is alert and oriented to person, place, and time. She has normal reflexes. She displays normal reflexes. No cranial nerve deficit. She exhibits abnormal muscle tone. Coordination normal.  Skin: Skin is warm and dry.  Psychiatric: She has a normal mood and affect. Her behavior is normal. Judgment and thought content normal.   Examination of right Upper Extremity is done.  Inspection:   Overall:  Elbow non-tender without crepitus or defects, forearm non-tender without crepitus or defects, wrist non-tender without crepitus or defects, hand non-tender.    Shoulder: with glenohumeral joint tenderness, without effusion.   Upper arm:  without swelling and tenderness   Range of motion:   Overall:  Full range of motion of the elbow, full range of motion of wrist and full range of motion in fingers.   Shoulder:  right  160 degrees forward flexion; 145 degrees abduction; 35 degrees internal rotation, 35 degrees external rotation, 20 degrees extension, 40 degrees adduction.   Stability:   Overall:  Shoulder, elbow and wrist stable   Strength and Tone:   Overall full shoulder muscles strength, full upper arm strength and normal upper arm bulk and tone.  The patient request injection, verbal consent was obtained.  The right shoulder was prepped appropriately after time out was performed.   Sterile technique was observed and injection of 1 cc of Depo-Medrol 40 mg with several cc's of plain xylocaine. Anesthesia was provided by ethyl chloride and a 20-gauge needle was used to inject the shoulder area. A posterior approach was used.  The injection was tolerated well.  A band aid dressing was applied.  The patient was advised to apply ice later today and tomorrow to the injection sight as needed. She curretnly smokes and is willing to quit. Encounter Diagnosis  Name Primary?  . Right shoulder pain Yes       Assessment & Plan:  Right shoulder pain  Physical [facility] is scheduled for [facility]  Exercises were explained to the patient.  Call if any problem

## 2015-07-16 ENCOUNTER — Ambulatory Visit: Payer: Medicare Other | Admitting: Cardiovascular Disease

## 2015-07-17 ENCOUNTER — Ambulatory Visit: Payer: Medicare Other | Admitting: Cardiovascular Disease

## 2015-07-30 ENCOUNTER — Ambulatory Visit (INDEPENDENT_AMBULATORY_CARE_PROVIDER_SITE_OTHER): Payer: Medicare Other | Admitting: Orthopaedic Surgery

## 2015-07-30 VITALS — BP 122/53 | HR 74 | Temp 98.1°F | Ht 59.0 in | Wt 163.4 lb

## 2015-07-30 DIAGNOSIS — M25511 Pain in right shoulder: Secondary | ICD-10-CM | POA: Diagnosis not present

## 2015-07-30 NOTE — Progress Notes (Signed)
CC:  My shoulder did well for a few weeks, it is hurting again  Her right shoulder improved with the injection.  She is having more pain now.  She needs a MRI but will have to wait another three weeks before her insurance will approve the study.  She has no swelling, no redness and no paresthesias.  PROCEDURE NOTE:  The patient request injection, verbal consent was obtained.  The right shoulder was prepped appropriately after time out was performed.   Sterile technique was observed and injection of 1 cc of Depo-Medrol 40 mg with several cc's of plain xylocaine. Anesthesia was provided by ethyl chloride and a 20-gauge needle was used to inject the shoulder area. A posterior approach was used.  The injection was tolerated well.  A band aid dressing was applied.  The patient was advised to apply ice later today and tomorrow to the injection sight as needed.  I will see her in three weeks.  She is to continue her present medications.  Call if any problem.  Consider MRI on return of the right shoulder.

## 2015-08-18 ENCOUNTER — Encounter: Payer: Self-pay | Admitting: Cardiovascular Disease

## 2015-08-18 ENCOUNTER — Ambulatory Visit (INDEPENDENT_AMBULATORY_CARE_PROVIDER_SITE_OTHER): Payer: Medicare Other | Admitting: Cardiovascular Disease

## 2015-08-18 VITALS — BP 116/64 | HR 69 | Ht 59.0 in | Wt 165.0 lb

## 2015-08-18 DIAGNOSIS — R0609 Other forms of dyspnea: Secondary | ICD-10-CM

## 2015-08-18 DIAGNOSIS — R072 Precordial pain: Secondary | ICD-10-CM

## 2015-08-18 DIAGNOSIS — R002 Palpitations: Secondary | ICD-10-CM | POA: Diagnosis not present

## 2015-08-18 DIAGNOSIS — Z8249 Family history of ischemic heart disease and other diseases of the circulatory system: Secondary | ICD-10-CM

## 2015-08-18 DIAGNOSIS — E785 Hyperlipidemia, unspecified: Secondary | ICD-10-CM | POA: Diagnosis not present

## 2015-08-18 DIAGNOSIS — R0600 Dyspnea, unspecified: Secondary | ICD-10-CM

## 2015-08-18 NOTE — Progress Notes (Signed)
Patient ID: Sabrina Beasley, female   DOB: 01/20/1961, 55 y.o.   MRN: [id number]      SUBJECTIVE: The patient returns for follow-up after undergoing cardiovascular testing performed for the evaluation of chest pain and palpitations.  Nuclear myocardial perfusion study on 06/19/15 was low risk, EF 57%. She demonstrated poor exercise tolerance limiting the ability of the study to detect ischemia. Duke treadmill score indicates a moderate risk of ischemic heart disease.  Echocardiogram was normal, EF 60-65%.  Symptoms including chest pain and palpitations have resolved with the use of atenolol.   Review of Systems: As per "subjective", otherwise negative.  Allergies  Allergen Reactions  . Nitrofurantoin Monohyd Macro Nausea And Vomiting    Also gets shaky  . Sulfa Antibiotics Rash    Current Outpatient Prescriptions  Medication Sig Dispense Refill  . acetaminophen (TYLENOL) 500 MG tablet Take 1,000 mg by mouth every 6 (six) hours as needed for moderate pain.    Marland Kitchen atenolol (TENORMIN) 25 MG tablet Take 25 mg by mouth daily.    . citalopram (CELEXA) 40 MG tablet Take 40 mg by mouth daily.      . fluconazole (DIFLUCAN) 150 MG tablet Take 1 tablet by mouth daily.    Marland Kitchen HYDROcodone-acetaminophen (NORCO/VICODIN) 5-325 MG tablet Take 1 tablet by mouth every 4 (four) hours as needed for moderate pain (Must last 30 days.  Do not take and drive a car or use machinery.). 120 tablet 0  . naproxen (NAPROSYN) 500 MG tablet Take 1 tablet (500 mg total) by mouth 2 (two) times daily with a meal. 60 tablet 5  . pantoprazole (PROTONIX) 40 MG tablet Take 1 tablet by mouth daily.    . polyethylene glycol (MIRALAX / GLYCOLAX) packet Take 17 g by mouth 2 (two) times daily.    . simvastatin (ZOCOR) 40 MG tablet Take 40 mg by mouth at bedtime.      . traZODone (DESYREL) 150 MG tablet Take 150 mg by mouth at bedtime.       No current facility-administered medications for this visit.    Past Medical History    Diagnosis Date  . Gout   . GERD (gastroesophageal reflux disease)   . Anxiety   . Arthritis   . Depression   . Osteoporosis   . History of rheumatic fever   . DDD (degenerative disc disease), cervical   . Hyperlipidemia   . Carpal tunnel syndrome on both sides   . Hx of hepatitis C   . Constipation   . PONV (postoperative nausea and vomiting)   . Kidney stones   . Diabetes mellitus without complication [facility])     Past Surgical History  Procedure Laterality Date  . Abdominal hysterectomy    . Carpal tunnel release      right wrist X2  . Cholecystectomy    . Carpal tunnel release Left 02/23/2013    Procedure: CARPAL TUNNEL RELEASE;  Surgeon: Carole Civil, MD;  Location: [facility];  Service: Orthopedics;  Laterality: Left;    Social History   Social History  . Marital Status: Married    Spouse Name: N/A  . Number of Children: N/A  . Years of Education: N/A   Occupational History  . Not on file.   Social History Main Topics  . Smoking status: Current Every Day Smoker -- 0.50 packs/day for 10 years    Types: Cigarettes    Start date: 05/10/1978  . Smokeless tobacco: Never Used  . Alcohol Use: No  .  Drug Use: No  . Sexual Activity: Not Currently    Birth Control/ Protection: Surgical   Other Topics Concern  . Not on file   Social History Narrative     Filed Vitals:   08/18/15 0941  BP: 116/64  Pulse: 69  Height: 4\' 11"  (1.499 m)  Weight: 165 lb (74.844 kg)  SpO2: 97%    PHYSICAL EXAM General: NAD Neck: No JVD, no thyromegaly or thyroid nodule.  Lungs: Clear to auscultation bilaterally with normal respiratory effort. CV: Nondisplaced PMI. Regular rate and rhythm, normal S1/S2, no S3/S4, no murmur. No peripheral edema.   Abdomen: Soft, nontender, obese. Skin: Intact without lesions or rashes.  Neurologic: Alert and oriented x 3.  Psych: Normal affect. Extremities: No clubbing or cyanosis.  HEENT: Normal.   ECG: Most recent ECG  reviewed.      ASSESSMENT AND PLAN: 1. Palpitations: Symptomatically stable with atenolol. 2-D echocardiogram with Doppler was normal.  2. Chest pain and progressive exertional dyspnea: Symptomatically improved with atenolol. Risk factors include family history of premature coronary artery disease, hyperlipidemia, and tobacco abuse. Exercise Cardiolite stress test results reviewed above. No further testing indicated at this time.  3. Hyperlipidemia: Continue simvastatin 40 mg.  Dispo: f/u prn.  Kate Sable, M.D., F.A.C.C.

## 2015-08-18 NOTE — Patient Instructions (Signed)
Your physician recommends that you schedule a follow-up appointment in: only as needed.       Thank you for choosing [facility] !         

## 2015-09-03 ENCOUNTER — Encounter: Payer: Self-pay | Admitting: Orthopaedic Surgery

## 2015-09-03 ENCOUNTER — Ambulatory Visit (INDEPENDENT_AMBULATORY_CARE_PROVIDER_SITE_OTHER): Payer: Medicare Other | Admitting: Orthopaedic Surgery

## 2015-09-03 VITALS — BP 130/68 | HR 73 | Temp 98.1°F | Resp 16 | Ht 59.0 in | Wt 165.0 lb

## 2015-09-03 DIAGNOSIS — M25511 Pain in right shoulder: Secondary | ICD-10-CM | POA: Diagnosis not present

## 2015-09-03 DIAGNOSIS — E119 Type 2 diabetes mellitus without complications: Secondary | ICD-10-CM

## 2015-09-03 NOTE — Patient Instructions (Signed)
MRI ORDERED. We will contact your insurance company for pre-certification. After we receive that, we will schedule you an appointment for the MRI and contact you. If you have not heard from our office in one week, contact us.     

## 2015-09-03 NOTE — Progress Notes (Signed)
Patient ID: Sabrina Beasley, female   DOB: 1960-08-04, 55 y.o.   MRN: [id number]  CC:  My shoulder is still hurting.  She has more pain in the right shoulder.  She is not improving.  The injection last time helped.  She would like another one.  She has no new injury.  She will need a MRI of the shoulder.  ROM is forward of 160, abduction 120, internal 30, external 30, extension 15, adduction 40.  I will order a MRI as I am concerned about a rotator cuff injury.  Encounter Diagnoses  Name Primary?  . Right shoulder pain Yes  . Diabetes mellitus without complication ([place])     PROCEDURE NOTE:  The patient request injection, verbal consent was obtained.  The right shoulder was prepped appropriately after time out was performed.   Sterile technique was observed and injection of 1 cc of Depo-Medrol 40 mg with several cc's of plain xylocaine. Anesthesia was provided by ethyl chloride and a 20-gauge needle was used to inject the shoulder area. A posterior approach was used.  The injection was tolerated well.  A band aid dressing was applied.  The patient was advised to apply ice later today and tomorrow to the injection sight as needed.  Return after MRI of the right shoulder.  Continue present medicine.

## 2015-09-10 ENCOUNTER — Ambulatory Visit (HOSPITAL_COMMUNITY)
Admission: RE | Admit: 2015-09-10 | Discharge: 2015-09-10 | Disposition: A | Discharge status: Home / Self Care | Payer: Medicare Other | Source: Ambulatory Visit | Attending: Orthopaedic Surgery | Admitting: Orthopaedic Surgery

## 2015-09-10 DIAGNOSIS — R937 Abnormal findings on diagnostic imaging of other parts of musculoskeletal system: Secondary | ICD-10-CM | POA: Insufficient documentation

## 2015-09-10 DIAGNOSIS — M25511 Pain in right shoulder: Secondary | ICD-10-CM | POA: Insufficient documentation

## 2015-09-11 ENCOUNTER — Encounter: Payer: Self-pay | Admitting: Orthopaedic Surgery

## 2015-09-11 ENCOUNTER — Ambulatory Visit (INDEPENDENT_AMBULATORY_CARE_PROVIDER_SITE_OTHER): Payer: Medicare Other | Admitting: Orthopaedic Surgery

## 2015-09-11 VITALS — BP 137/72 | HR 72 | Temp 97.2°F | Ht 59.0 in | Wt 165.0 lb

## 2015-09-11 DIAGNOSIS — M25511 Pain in right shoulder: Secondary | ICD-10-CM

## 2015-09-11 MED ORDER — PREDNISONE 10 MG (21) PO TBPK: ORAL_TABLET | ORAL | Status: DC

## 2015-09-11 MED ORDER — NABUMETONE 750 MG PO TABS: 750.0000 mg | ORAL_TABLET | Freq: Two times a day (BID) | ORAL | Status: DC

## 2015-09-11 NOTE — Progress Notes (Signed)
Patient ID: [name], female   DOB: 16-Feb-1961, 55 y.o.   MRN: [id number]  CC:  My right shoulder is hurting  She still has pain of the right shoulder.  She had the MRI of the shoulder done.  It shows: IMPRESSION: 1. 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 2. Mild tendinosis of the infraspinatus tendon without a tear.  I have told her she does not need any surgery.  I will change her to Relafen 750 bid pc.  Stop the Naprosyn.  Encounter Diagnosis  Name Primary?  . Right shoulder pain Yes    PROCEDURE NOTE:  The patient request injection, verbal consent was obtained.  The right shoulder was prepped appropriately after time out was performed.   Sterile technique was observed and injection of 1 cc of Depo-Medrol 40 mg with several cc's of plain xylocaine. Anesthesia was provided by ethyl chloride and a 20-gauge needle was used to inject the shoulder area. A posterior approach was used.  The injection was tolerated well.  A band aid dressing was applied.  The patient was advised to apply ice later today and tomorrow to the injection sight as needed.  Return in one month  Continue exercises.

## 2015-09-16 ENCOUNTER — Ambulatory Visit: Payer: Medicare Other | Admitting: Orthopaedic Surgery

## 2015-10-09 ENCOUNTER — Ambulatory Visit: Payer: Medicare Other | Admitting: Orthopaedic Surgery

## 2015-10-22 ENCOUNTER — Encounter: Payer: Self-pay | Admitting: Orthopaedic Surgery

## 2015-10-22 ENCOUNTER — Ambulatory Visit (INDEPENDENT_AMBULATORY_CARE_PROVIDER_SITE_OTHER): Payer: Medicare Other | Admitting: Orthopaedic Surgery

## 2015-10-22 VITALS — BP 123/76 | HR 71 | Temp 97.7°F | Resp 16 | Ht 59.0 in | Wt 162.0 lb

## 2015-10-22 DIAGNOSIS — E119 Type 2 diabetes mellitus without complications: Secondary | ICD-10-CM

## 2015-10-22 DIAGNOSIS — M25511 Pain in right shoulder: Secondary | ICD-10-CM

## 2015-10-22 NOTE — Progress Notes (Signed)
CC:  My shoulder still hurts on the right  She has chronic pain of the right shoulder.  MRI has shown no tear.  She has had PT with little help.   Injections help.  Encounter Diagnoses  Name Primary?  . Right shoulder pain Yes  . Diabetes mellitus without complication ([place])     PROCEDURE NOTE:  The patient request injection, verbal consent was obtained.  The right shoulder was prepped appropriately after time out was performed.   Sterile technique was observed and injection of 1 cc of Depo-Medrol 40 mg with several cc's of plain xylocaine. Anesthesia was provided by ethyl chloride and a 20-gauge needle was used to inject the shoulder area. A posterior approach was used.  The injection was tolerated well.  A band aid dressing was applied.  The patient was advised to apply ice later today and tomorrow to the injection sight as needed.  Return in one month.  Call if any problem.  Electronically Signed Sanjuana Kava, MD 6/14/20172:11 PM

## 2015-11-19 ENCOUNTER — Ambulatory Visit: Payer: Medicare Other | Admitting: Orthopaedic Surgery

## 2015-11-26 ENCOUNTER — Ambulatory Visit (INDEPENDENT_AMBULATORY_CARE_PROVIDER_SITE_OTHER): Payer: Medicare Other | Admitting: Obstetrics and Gynecology

## 2015-11-26 ENCOUNTER — Encounter: Payer: Self-pay | Admitting: Obstetrics and Gynecology

## 2015-11-26 VITALS — BP 128/76 | Ht 59.0 in | Wt 164.0 lb

## 2015-11-26 DIAGNOSIS — K5909 Other constipation: Secondary | ICD-10-CM | POA: Diagnosis not present

## 2015-11-26 DIAGNOSIS — N816 Rectocele: Secondary | ICD-10-CM

## 2015-11-26 DIAGNOSIS — F17210 Nicotine dependence, cigarettes, uncomplicated: Secondary | ICD-10-CM | POA: Diagnosis not present

## 2015-11-26 DIAGNOSIS — N393 Stress incontinence (female) (male): Secondary | ICD-10-CM | POA: Diagnosis not present

## 2015-11-26 NOTE — Progress Notes (Signed)
Patient ID: Elainah Kostyk, female   DOB: 13-Mar-1961, 55 y.o.   MRN: [id number]   [facility] Visit  @DATE @            Patient name: Charnika Huntsberry MRN [id number]  Date of birth: 02-Oct-1960  CC & HPI:  Dioselin Edkin is a 55 y.o. female presenting today for SUI. She states that she experiences urinary incontinence when she coughs, sneezes or laughs. Per pt, she occassionally utilizes pads for protection from incontinence. She also complains that she has difficulty having bowel movements and regularly takes laxatives. Pt reports that she has difficulty passing the entire BM and feels as though it gets stuck. Pt states she has had 3 children delivered at term vaginally and 3 pregnancies. Pt is a current smoker, 1/2 ppd. She is not a chronic steroid user.   ROS:  Review of Systems  Genitourinary:       +SUI  All other systems reviewed and are negative.   Pertinent History Reviewed:   Reviewed: Significant for abdominal hysterectomy  Medical         Past Medical History  Diagnosis Date  . Gout   . GERD (gastroesophageal reflux disease)   . Anxiety   . Arthritis   . Depression   . Osteoporosis   . History of rheumatic fever   . DDD (degenerative disc disease), cervical   . Hyperlipidemia   . Carpal tunnel syndrome on both sides   . Hx of hepatitis C   . Constipation   . PONV (postoperative nausea and vomiting)   . Kidney stones   . Diabetes mellitus without complication [place])                               Surgical Hx:    Past Surgical History  Procedure Laterality Date  . Abdominal hysterectomy    . Carpal tunnel release      right wrist X2  . Cholecystectomy    . Carpal tunnel release Left 02/23/2013    Procedure: CARPAL TUNNEL RELEASE;  Surgeon: Carole Civil, MD;  Location: [facility];  Service: Orthopedics;  Laterality: Left;   Medications: Reviewed & Updated - see associated section                       Current outpatient prescriptions:  .  acetaminophen (TYLENOL)  500 MG tablet, Take 1,000 mg by mouth every 6 (six) hours as needed for moderate pain., Disp: , Rfl:  .  atenolol (TENORMIN) 25 MG tablet, Take 25 mg by mouth daily., Disp: , Rfl:  .  citalopram (CELEXA) 40 MG tablet, Take 40 mg by mouth daily.  , Disp: , Rfl:  .  HYDROcodone-acetaminophen (NORCO/VICODIN) 5-325 MG tablet, Take 1 tablet by mouth every 4 (four) hours as needed for moderate pain (Must last 30 days.  Do not take and drive a car or use machinery.)., Disp: 120 tablet, Rfl: 0 .  nabumetone (RELAFEN) 750 MG tablet, Take 1 tablet (750 mg total) by mouth 2 (two) times daily. One by mouth twice a day after eating., Disp: 60 tablet, Rfl: 5 .  pantoprazole (PROTONIX) 40 MG tablet, Take 1 tablet by mouth daily., Disp: , Rfl:  .  polyethylene glycol (MIRALAX / GLYCOLAX) packet, Take 17 g by mouth 2 (two) times daily., Disp: , Rfl:  .  simvastatin (ZOCOR) 40 MG tablet, Take 40 mg by mouth at  bedtime.  , Disp: , Rfl:  .  traZODone (DESYREL) 150 MG tablet, Take 150 mg by mouth at bedtime.  , Disp: , Rfl:    Social History: Reviewed -  reports that she has been smoking Cigarettes.  She started smoking about 37 years ago. She has a 5 pack-year smoking history. She has never used smokeless tobacco.  Objective Findings:  Vitals: Blood pressure 128/76, height 4\' 11"  (1.499 m), weight 164 lb (74.39 kg).  Physical Examination: General appearance - alert, well appearing, and in no distress Abdomen - soft, nontender, nondistended, no masses or organomegaly Pelvic -  VULVA: normal appearing vulva with no masses, tenderness or lesions,  VAGINA: normal appearing vagina with normal color and discharge, no lesions, good vaginal apex support  PELVIC FLOOR EXAM: Moderate rectocele to 90 degrees, well healed posterior repair, minimal cystocele,  CERVIX: absent, removed surgically   UTERUS: absent, removed surgically  ADNEXA: absent, removed surgically  Rectal - normal rectal tone, no masses  Musculoskeletal  - no joint tenderness, deformity or swelling Extremities - peripheral pulses normal, no pedal edema, no clubbing or cyanosis Skin - normal coloration and turgor, no rashes, no suspicious skin lesions noted   Urinalysis not done  Guaiac negative   Discussed posterior repair. Advised pt to utilize splinting to further assess if posterior repair would be beneficial. Also Kegl exercises and 10% weight loss to increase bladder support vs surgically. At end of discussion, pt had opportunity to ask questions and has no further questions at this time.   Greater than 50% was spent in counseling and coordination of care with the patient. Total time greater than: 25 minutes   Assessment & Plan:   A:  1. Minimal SUI 2. Moderate rectocele to 90 degrees, minimal cystocele  3. Advised pt to utilize splinting to further assess if posterior repair would be beneficial  4. Discussed Kegl exercises and 10% weight loss to increase bladder support    P:  1. F/u in 2-3 weeks for further discussion       By signing my name below, I, Hansel Feinstein, attest that this documentation has been prepared under the direction and in the presence of Jonnie Kind, MD. Electronically Signed: Hansel Feinstein, ED Scribe. 11/26/2015. 9:00 AM.  I personally performed the services described in this documentation, which was SCRIBED in my presence. The recorded information has been reviewed and considered accurate. It has been edited as necessary during review. Jonnie Kind, MD

## 2015-12-01 ENCOUNTER — Telehealth: Payer: Self-pay | Admitting: Obstetrics and Gynecology

## 2015-12-01 ENCOUNTER — Encounter: Payer: Self-pay | Admitting: Obstetrics & Gynecology

## 2015-12-01 NOTE — Telephone Encounter (Signed)
Spoke with pt, she states that she does want to proceed with the surgery. Pt states that she ca not do the surgery until August the 28th or after. I advised the pt that it would be better to keep her August 9th appointment that way she would have a little room just incase he couldn't get her in on the 29th for surgery. Pt verbalized understanding and will keep scheduled appointment.

## 2015-12-17 ENCOUNTER — Ambulatory Visit: Payer: Medicare Other | Admitting: Obstetrics and Gynecology

## 2015-12-17 ENCOUNTER — Encounter: Payer: Self-pay | Admitting: Obstetrics and Gynecology

## 2015-12-17 ENCOUNTER — Ambulatory Visit (INDEPENDENT_AMBULATORY_CARE_PROVIDER_SITE_OTHER): Payer: Medicare Other | Admitting: Obstetrics and Gynecology

## 2015-12-17 DIAGNOSIS — N816 Rectocele: Secondary | ICD-10-CM | POA: Diagnosis not present

## 2015-12-17 NOTE — Progress Notes (Addendum)
Patient ID: Sabrina Beasley, female   DOB: [birth year], 55 y.o.   MRN: [id number]  Preoperative History and Physical  Sabrina Beasley is a 55 y.o. No obstetric history on file. here for surgical management of rectocele. Pt states she used the splinting technique as advised, and reports that this helped her pass BMs. She reports that she occasionally has SUI with coughing, and occasionally wears pads for protection.  Pt also notes that she occasionally experiences dyspareunia.   She also notes she has recurrent painful areas of swelling to the vulva, which she has previously had to have lanced.   Proposed surgery: posterior repair   Past Medical History:  Diagnosis Date  . Anxiety   . Arthritis   . Carpal tunnel syndrome on both sides   . Constipation   . DDD (degenerative disc disease), cervical   . Depression   . Diabetes mellitus without complication ([place])   . GERD (gastroesophageal reflux disease)   . Gout   . History of rheumatic fever   . Hx of hepatitis C   . Hyperlipidemia   . Kidney stones   . Osteoporosis   . PONV (postoperative nausea and vomiting)    Past Surgical History:  Procedure Laterality Date  . ABDOMINAL HYSTERECTOMY    . CARPAL TUNNEL RELEASE     right wrist X2  . CARPAL TUNNEL RELEASE Left 02/23/2013   Procedure: CARPAL TUNNEL RELEASE;  Surgeon: Carole Civil, MD;  Location: [facility];  Service: Orthopedics;  Laterality: Left;  . CHOLECYSTECTOMY     OB History  No data available  Patient denies any other pertinent gynecologic issues.   Current Outpatient Prescriptions on File Prior to Visit  Medication Sig Dispense Refill  . acetaminophen (TYLENOL) 500 MG tablet Take 1,000 mg by mouth every 6 (six) hours as needed for moderate pain.    Marland Kitchen atenolol (TENORMIN) 25 MG tablet Take 25 mg by mouth daily.    . citalopram (CELEXA) 40 MG tablet Take 40 mg by mouth daily.      Marland Kitchen HYDROcodone-acetaminophen (NORCO/VICODIN) 5-325 MG tablet Take 1 tablet by mouth every 4  (four) hours as needed for moderate pain (Must last 30 days.  Do not take and drive a car or use machinery.). 120 tablet 0  . nabumetone (RELAFEN) 750 MG tablet Take 1 tablet (750 mg total) by mouth 2 (two) times daily. One by mouth twice a day after eating. 60 tablet 5  . pantoprazole (PROTONIX) 40 MG tablet Take 1 tablet by mouth daily.    . polyethylene glycol (MIRALAX / GLYCOLAX) packet Take 17 g by mouth 2 (two) times daily.    . simvastatin (ZOCOR) 40 MG tablet Take 40 mg by mouth at bedtime.      . traZODone (DESYREL) 150 MG tablet Take 150 mg by mouth at bedtime.       No current facility-administered medications on file prior to visit.    Allergies  Allergen Reactions  . Nitrofurantoin Monohyd Macro Nausea And Vomiting    Also gets shaky  . Sulfa Antibiotics Rash    Social History:   reports that she has been smoking Cigarettes.  She started smoking about 37 years ago. She has a 5.00 pack-year smoking history. She has never used smokeless tobacco. She reports that she does not drink alcohol or use drugs.  Family History  Problem Relation Age of Onset  . Hypertension Mother   . Hyperlipidemia Mother   . Heart disease Father  Review of Systems:  +dyspareunia, unchanged   +mild SUI  PHYSICAL EXAM: Blood pressure 130/80, pulse 73, height 4\' 11"  (1.499 m), weight 162 lb 9.6 oz (73.8 kg). General appearance - alert, well appearing, and in no distress Chest - clear to auscultation, no wheezes, rales or rhonchi, symmetric air entry Heart - normal rate and regular rhythm Abdomen - soft, nontender, nondistended, no masses or organomegaly Pelvic -  VULVA: multi person introitus , otherwise normal appearing vulva with no masses, tenderness or lesions VAGINA: normal appearing vagina with normal color and discharge, no lesions, adequate support of vaginal cuff, anterior vaginal wall and anterior wall of bladder. Cuff is mildly tender, unchanged. short vaginal length CERVIX:  surgically absent,  UTERUS: surgically absent, vaginal cuff well healed,  ADNEXA: surgically absent bilateral.  Rectal- 3 cm rectocele pouch to 90 degrees  Extremities - peripheral pulses normal, no pedal edema, no clubbing or cyanosis  Labs: No results found for this or any previous visit (from the past 336 hour(s)).  Imaging Studies: No results found.  Assessment: Patient Active Problem List   Diagnosis Date Noted  . Diabetes mellitus without complication ([place]) 0000000  . S/P carpal tunnel release 02/26/2013  . CTS (carpal tunnel syndrome) 02/14/2013  . Arm numbness 11/06/2010    Plan: Patient will undergo surgical management with posterior repair. To be scheduled on or after 01/05/16.  Will rx Keflex for boils /Folliculitis of thighs and buttocks   .mec 12/17/2015 2:08 PM    By signing my name below, I, Hansel Feinstein, attest that this documentation has been prepared under the direction and in the presence of Jonnie Kind, MD. Electronically Signed: Hansel Feinstein, ED Scribe. 12/17/15. 2:26 PM.  I personally performed the services described in this documentation, which was SCRIBED in my presence. The recorded information has been reviewed and considered accurate. It has been edited as necessary during review. Jonnie Kind, MD Time : >25 minutes, over 50%in direct contact and counselling, as preop visit.

## 2015-12-18 ENCOUNTER — Telehealth: Payer: Self-pay | Admitting: Adult Health

## 2015-12-18 ENCOUNTER — Telehealth: Payer: Self-pay | Admitting: *Deleted

## 2015-12-19 MED ORDER — CEPHALEXIN 500 MG PO CAPS: 500.0000 mg | ORAL_CAPSULE | Freq: Four times a day (QID) | ORAL | 0 refills | Status: DC

## 2015-12-19 MED ORDER — ESTRADIOL 1 MG PO TABS: 1.0000 mg | ORAL_TABLET | Freq: Every day | ORAL | 11 refills | Status: DC

## 2015-12-19 NOTE — Telephone Encounter (Signed)
Pt aware that medications will be sent to her pharmacy.   Spoke with Dr. Glo Herring and he is sending the Rx at this time.

## 2015-12-22 ENCOUNTER — Telehealth: Payer: Self-pay | Admitting: *Deleted

## 2015-12-23 ENCOUNTER — Encounter: Payer: Self-pay | Admitting: Obstetrics and Gynecology

## 2015-12-23 DIAGNOSIS — N816 Rectocele: Secondary | ICD-10-CM | POA: Insufficient documentation

## 2016-01-06 ENCOUNTER — Telehealth: Payer: Self-pay | Admitting: Orthopaedic Surgery

## 2016-01-06 NOTE — Patient Instructions (Signed)
Sabrina Beasley  01/06/2016     @PREFPERIOPPHARMACY @   Your procedure is scheduled on 01/13/2016.  Report to [facility] at 6:15 A.M.  Call this number if you have problems the morning of surgery:  [phone]   Remember:  Do not eat food or drink liquids after midnight.  Take these medicines the morning of surgery with A SIP OF WATER : TENORMIN, CELEXA AN PROTONIX   Do not wear jewelry, make-up or nail polish.  Do not wear lotions, powders, or perfumes, or deoderant.  Do not shave 48 hours prior to surgery.  Men may shave face and neck.  Do not bring valuables to the hospital.  [facility] is not responsible for any belongings or valuables.  Contacts, dentures or bridgework may not be worn into surgery.  Leave your suitcase in the car.  After surgery it may be brought to your room.  For patients admitted to the hospital, discharge time will be determined by your treatment team.  Patients discharged the day of surgery will not be allowed to drive home.   Name and phone number of your driver:   FAMILY Special instructions:  N/A  Please read over the following fact sheets that you were given. Care and Recovery After Surgery About Rectocele  Overview  A rectocele is a type of hernia which causes different degrees of bulging of the rectal tissues into the vaginal wall.  You may even notice that it presses against the vaginal wall so much that some vaginal tissues droop outside of the opening of your vagina.  Causes of Rectocele  The most common cause is childbirth.  The muscles and ligaments in the pelvis that hold up and support the female organs and vagina become stretched and weakened during labor and delivery.  The more babies you have, the more the support tissues are stretched and weakened.  Not everyone who has a baby will develop a rectocele.  Some women have stronger supporting tissue in the pelvis and may not have as much of a problem as others.  Women who have a Cesarean  section usually do not get rectocele's unless they pushed a long time prior to the cesarean delivery.  Other conditions that can cause a rectocele include chronic constipation, a chronic cough, a lot of heavy lifting, and obesity.  Older women may have this problem because the loss of female hormones causes the vaginal tissue to become weaker.  Symptoms  There may not be any symptoms.  If you do have symptoms, they may include:  Pelvic pressure in the rectal area  Protrusion of the lower part of the vagina through the opening of the vagina  Constipation and trapping of the stool, making it difficult to have a bowel movement.  In severe cases, you may have to press on the lower part of your vagina to help push the stool out of you rectum.  This is called splinting to empty.  Diagnosing Rectocele  Your health care provider will ask about your symptoms and perform a pelvic exam.  S/he will ask you to bear down, pushing like you are having a bowel movement so as to see how far the lower part of the vagina protrudes into the vagina and possible outside of the vagina.  Your provider will also ask you to contract the muscles of your pelvis (like you are stopping the stream in the middle of urinating) to determine the strength of your pelvic muscles.  Your provider may also  do a rectal exam.  Treatment Options  If you do not have any symptoms, no treatment may be necessary.  Other treatment options include:  Pelvic floor exercises: Contracting the muscles in your genital area may help strengthen your muscles and support the organs.  Be sure to get proper exercise instruction from you physical therapist.  A pessary (removealbe pelvic support device) sometimes helps rectocele symptoms.  Surgery: Surgical repair may be necessary. In some cases the uterus may need to be taken out ( a hysterectomy) as well.  There are many types of surgery for pelvic support problems.  Look for physicians who specialize  in repair procedures.  You can take care of yourself by:  Treating and preventing constipation  Avoiding heavy lifting, and lifting correctly (with your legs, not with you waist or back)  Treating a chronic cough or bronchitis  Not smoking  avoiding too much weight gain  Doing pelvic floor exercises   2007, [employer] Doc.33

## 2016-01-07 ENCOUNTER — Other Ambulatory Visit: Payer: Self-pay | Admitting: Obstetrics and Gynecology

## 2016-01-07 NOTE — Progress Notes (Signed)
Patient ID: Sabrina Beasley, female   DOB: 1960/09/18, 55 y.o.   MRN: [id number]  Preoperative History and Physical  Sabrina Beasley is a 55 y.o. No obstetric history on file. here for surgical management of rectocele. Pt states she used the splinting technique as advised, and reports that this helped her pass BMs. She reports that she occasionally has SUI with coughing, and occasionally wears pads for protection.  Pt also notes that she occasionally experiences dyspareunia.   She also notes she has recurrent painful areas of swelling to the vulva, which she has previously had to have lanced.   Proposed surgery: posterior repair   Past Medical History:  Diagnosis Date  . Anxiety   . Arthritis   . Carpal tunnel syndrome on both sides   . Constipation   . DDD (degenerative disc disease), cervical   . Depression   . Diabetes mellitus without complication ([place])   . GERD (gastroesophageal reflux disease)   . Gout   . History of rheumatic fever   . Hx of hepatitis C   . Hyperlipidemia   . Kidney stones   . Osteoporosis   . PONV (postoperative nausea and vomiting)    Past Surgical History:  Procedure Laterality Date  . ABDOMINAL HYSTERECTOMY    . CARPAL TUNNEL RELEASE     right wrist X2  . CARPAL TUNNEL RELEASE Left 02/23/2013   Procedure: CARPAL TUNNEL RELEASE;  Surgeon: Carole Civil, MD;  Location: [facility];  Service: Orthopedics;  Laterality: Left;  . CHOLECYSTECTOMY     OB History  No data available  Patient denies any other pertinent gynecologic issues.   Current Outpatient Prescriptions on File Prior to Visit  Medication Sig Dispense Refill  . acetaminophen (TYLENOL) 500 MG tablet Take 1,000 mg by mouth every 6 (six) hours as needed for moderate pain.    Marland Kitchen atenolol (TENORMIN) 25 MG tablet Take 25 mg by mouth daily.    . citalopram (CELEXA) 40 MG tablet Take 40 mg by mouth daily.      Marland Kitchen estradiol (ESTRACE) 1 MG tablet Take 1 tablet (1 mg total) by mouth daily. 30 tablet 11  .  fenofibrate (TRICOR) 145 MG tablet Take 145 mg by mouth daily.    . Multiple Vitamins-Minerals (MULTIVITAMINS THER. W/MINERALS) TABS tablet Take 1 tablet by mouth daily.    . nabumetone (RELAFEN) 750 MG tablet Take 1 tablet (750 mg total) by mouth 2 (two) times daily. One by mouth twice a day after eating. (Patient taking differently: Take 750 mg by mouth 2 (two) times daily as needed for mild pain. One by mouth twice a day after eating.) 60 tablet 5  . naproxen (NAPROSYN) 500 MG tablet TAKE 1 TABLET(500 MG) BY MOUTH TWICE DAILY WITH A MEAL 60 tablet 5  . pantoprazole (PROTONIX) 40 MG tablet Take 1 tablet by mouth daily.    . polyethylene glycol (MIRALAX / GLYCOLAX) packet Take 17 g by mouth 2 (two) times daily.    . simvastatin (ZOCOR) 40 MG tablet Take 40 mg by mouth at bedtime.      . traZODone (DESYREL) 150 MG tablet Take 150 mg by mouth at bedtime.       No current facility-administered medications on file prior to visit.    Allergies  Allergen Reactions  . Nitrofurantoin Monohyd Macro Nausea And Vomiting    Also gets shaky  . Sulfa Antibiotics Rash    Social History:   reports that she has been smoking Cigarettes.  She started smoking about 37 years ago. She has a 5.00 pack-year smoking history. She has never used smokeless tobacco. She reports that she does not drink alcohol or use drugs.  Family History  Problem Relation Age of Onset  . Hypertension Mother   . Hyperlipidemia Mother   . Heart disease Father     Review of Systems:  +dyspareunia, unchanged   +mild SUI  PHYSICAL EXAM: There were no vitals taken for this visit. General appearance - alert, well appearing, and in no distress Chest - clear to auscultation, no wheezes, rales or rhonchi, symmetric air entry Heart - normal rate and regular rhythm Abdomen - soft, nontender, nondistended, no masses or organomegaly Pelvic -  VULVA: multi person introitus , otherwise normal appearing vulva with no masses, tenderness or  lesions VAGINA: normal appearing vagina with normal color and discharge, no lesions, adequate support of vaginal cuff, anterior vaginal wall and anterior wall of bladder. Cuff is mildly tender, unchanged. short vaginal length CERVIX: surgically absent,  UTERUS: surgically absent, vaginal cuff well healed,  ADNEXA: surgically absent bilateral.  Rectal- 3 cm rectocele pouch to 90 degrees  Extremities - peripheral pulses normal, no pedal edema, no clubbing or cyanosis  Labs: No results found for this or any previous visit (from the past 336 hour(s)).  Imaging Studies: No results found.  Assessment: Patient Active Problem List   Diagnosis Date Noted  . Rectocele 12/23/2015  . Diabetes mellitus without complication ([place]) 0000000  . S/P carpal tunnel release 02/26/2013  . CTS (carpal tunnel syndrome) 02/14/2013  . Arm numbness 11/06/2010    Plan: Patient will undergo surgical management with posterior repair. To be scheduled on or after 01/05/16.  Will rx Keflex for boils /Folliculitis of thighs and buttocks   .mec 01/07/2016 9:10 AM    By signing my name below, I, Hansel Feinstein, attest that this documentation has been prepared under the direction and in the presence of Jonnie Kind, MD. Electronically Signed: Hansel Feinstein, ED Scribe. 12/17/15. 2:26 PM.  I personally performed the services described in this documentation, which was SCRIBED in my presence. The recorded information has been reviewed and considered accurate. It has been edited as necessary during review. Jonnie Kind, MD Time : >25 minutes, over 50%in direct contact and counselling, as preop visit.

## 2016-01-08 ENCOUNTER — Encounter (HOSPITAL_COMMUNITY): Payer: Self-pay

## 2016-01-08 ENCOUNTER — Encounter (HOSPITAL_COMMUNITY)
Admission: RE | Admit: 2016-01-08 | Discharge: 2016-01-08 | Disposition: A | Discharge status: Home / Self Care | Payer: Medicare Other | Source: Ambulatory Visit | Attending: Obstetrics and Gynecology | Admitting: Obstetrics and Gynecology

## 2016-01-08 DIAGNOSIS — Z01812 Encounter for preprocedural laboratory examination: Secondary | ICD-10-CM | POA: Insufficient documentation

## 2016-01-08 HISTORY — DX: Personal history of urinary calculi: Z87.442

## 2016-01-08 LAB — URINALYSIS, ROUTINE W REFLEX MICROSCOPIC
Bilirubin Urine: NEGATIVE
Glucose, UA: NEGATIVE mg/dL
Hgb urine dipstick: NEGATIVE
Ketones, ur: NEGATIVE mg/dL
Leukocytes, UA: NEGATIVE
Nitrite: NEGATIVE
Protein, ur: NEGATIVE mg/dL
Specific Gravity, Urine: <1.005 — ABNORMAL LOW (ref 1.005–1.030)
pH: 6 (ref 5.0–8.0)

## 2016-01-08 LAB — CBC
HCT: 36.8 % (ref 36.0–46.0)
Hemoglobin: 12.7 g/dL (ref 12.0–15.0)
MCH: 31.1 pg (ref 26.0–34.0)
MCHC: 34.5 g/dL (ref 30.0–36.0)
MCV: 90.2 fL (ref 78.0–100.0)
Platelets: 416 10*3/uL — ABNORMAL HIGH (ref 150–400)
RBC: 4.08 MIL/uL (ref 3.87–5.11)
RDW: 13.3 % (ref 11.5–15.5)
WBC: 10 10*3/uL (ref 4.0–10.5)

## 2016-01-08 LAB — COMPREHENSIVE METABOLIC PANEL
ALT: 23 U/L (ref 14–54)
AST: 27 U/L (ref 15–41)
Albumin: 4.5 g/dL (ref 3.5–5.0)
Alkaline Phosphatase: 43 U/L (ref 38–126)
Anion gap: 6 (ref 5–15)
BUN: 17 mg/dL (ref 6–20)
CO2: 27 mmol/L (ref 22–32)
Calcium: 9.7 mg/dL (ref 8.9–10.3)
Chloride: 105 mmol/L (ref 101–111)
Creatinine, Ser: 0.87 mg/dL (ref 0.44–1.00)
GFR calc Af Amer: >60 mL/min (ref >60)
GFR calc non Af Amer: >60 mL/min (ref >60)
Glucose, Bld: 90 mg/dL (ref 65–99)
Potassium: 4.7 mmol/L (ref 3.5–5.1)
Sodium: 138 mmol/L (ref 135–145)
Total Bilirubin: 0.6 mg/dL (ref 0.3–1.2)
Total Protein: 7.2 g/dL (ref 6.5–8.1)

## 2016-01-08 NOTE — Pre-Procedure Instructions (Signed)
Patient given information to sign up for my chart at home. 

## 2016-01-13 ENCOUNTER — Encounter (HOSPITAL_COMMUNITY): Payer: Self-pay | Admitting: *Deleted

## 2016-01-13 ENCOUNTER — Ambulatory Visit (HOSPITAL_COMMUNITY): Payer: Medicare Other | Admitting: Anesthesiology

## 2016-01-13 ENCOUNTER — Encounter (HOSPITAL_COMMUNITY)
Admission: RE | Disposition: A | Discharge status: Home / Self Care | Payer: Self-pay | Source: Ambulatory Visit | Attending: Obstetrics and Gynecology

## 2016-01-13 ENCOUNTER — Ambulatory Visit (HOSPITAL_COMMUNITY)
Admission: RE | Admit: 2016-01-13 | Discharge: 2016-01-13 | Disposition: A | Discharge status: Home / Self Care | Payer: Medicare Other | Source: Ambulatory Visit | Attending: Obstetrics and Gynecology | Admitting: Obstetrics and Gynecology

## 2016-01-13 DIAGNOSIS — Z79899 Other long term (current) drug therapy: Secondary | ICD-10-CM | POA: Insufficient documentation

## 2016-01-13 DIAGNOSIS — E119 Type 2 diabetes mellitus without complications: Secondary | ICD-10-CM | POA: Diagnosis not present

## 2016-01-13 DIAGNOSIS — K219 Gastro-esophageal reflux disease without esophagitis: Secondary | ICD-10-CM | POA: Insufficient documentation

## 2016-01-13 DIAGNOSIS — M1990 Unspecified osteoarthritis, unspecified site: Secondary | ICD-10-CM | POA: Diagnosis not present

## 2016-01-13 DIAGNOSIS — F329 Major depressive disorder, single episode, unspecified: Secondary | ICD-10-CM | POA: Insufficient documentation

## 2016-01-13 DIAGNOSIS — N816 Rectocele: Secondary | ICD-10-CM | POA: Diagnosis present

## 2016-01-13 DIAGNOSIS — Z791 Long term (current) use of non-steroidal anti-inflammatories (NSAID): Secondary | ICD-10-CM | POA: Diagnosis not present

## 2016-01-13 DIAGNOSIS — F17210 Nicotine dependence, cigarettes, uncomplicated: Secondary | ICD-10-CM | POA: Insufficient documentation

## 2016-01-13 DIAGNOSIS — E785 Hyperlipidemia, unspecified: Secondary | ICD-10-CM | POA: Insufficient documentation

## 2016-01-13 HISTORY — PX: RECTOCELE REPAIR: SHX761

## 2016-01-13 LAB — GLUCOSE, CAPILLARY
Glucose-Capillary: 105 mg/dL — ABNORMAL HIGH (ref 65–99)
Glucose-Capillary: 148 mg/dL — ABNORMAL HIGH (ref 65–99)

## 2016-01-13 SURGERY — COLPORRHAPHY, POSTERIOR, FOR RECTOCELE REPAIR
Anesthesia: General | Site: Vagina

## 2016-01-13 MED ORDER — HYDROMORPHONE HCL 1 MG/ML IJ SOLN: 0.2500 mg | INTRAMUSCULAR | Status: DC | PRN

## 2016-01-13 MED ORDER — NEOSTIGMINE METHYLSULFATE 10 MG/10ML IV SOLN
INTRAVENOUS | Status: AC
  Filled 2016-01-13: qty 1

## 2016-01-13 MED ORDER — BUPIVACAINE-EPINEPHRINE 0.5% -1:200000 IJ SOLN
INTRAMUSCULAR | Status: DC | PRN
  Administered 2016-01-13: 21 mL

## 2016-01-13 MED ORDER — DEXAMETHASONE SODIUM PHOSPHATE 4 MG/ML IJ SOLN
INTRAMUSCULAR | Status: AC
  Filled 2016-01-13: qty 1

## 2016-01-13 MED ORDER — LIDOCAINE HCL (CARDIAC) 10 MG/ML IV SOLN
INTRAVENOUS | Status: DC | PRN
  Administered 2016-01-13: 100 mg via INTRAVENOUS

## 2016-01-13 MED ORDER — SODIUM CHLORIDE 0.9 % IR SOLN
Status: DC | PRN
  Administered 2016-01-13: 500 mL

## 2016-01-13 MED ORDER — LIDOCAINE HCL (PF) 1 % IJ SOLN
INTRAMUSCULAR | Status: AC
Start: 2016-01-13 — End: 2016-01-13
  Filled 2016-01-13: qty 5

## 2016-01-13 MED ORDER — BUPIVACAINE-EPINEPHRINE (PF) 0.5% -1:200000 IJ SOLN
INTRAMUSCULAR | Status: AC
  Filled 2016-01-13: qty 30

## 2016-01-13 MED ORDER — ROCURONIUM BROMIDE 50 MG/5ML IV SOLN
INTRAVENOUS | Status: AC
  Filled 2016-01-13: qty 1

## 2016-01-13 MED ORDER — METOPROLOL TARTRATE 5 MG/5ML IV SOLN
INTRAVENOUS | Status: DC | PRN
  Administered 2016-01-13: 2 mg via INTRAVENOUS

## 2016-01-13 MED ORDER — PROPOFOL 10 MG/ML IV BOLUS
INTRAVENOUS | Status: AC
  Filled 2016-01-13: qty 20

## 2016-01-13 MED ORDER — ONDANSETRON HCL 4 MG/2ML IJ SOLN
4.0000 mg | Freq: Once | INTRAMUSCULAR | Status: AC
  Administered 2016-01-13: 4 mg via INTRAVENOUS

## 2016-01-13 MED ORDER — MIDAZOLAM HCL 2 MG/2ML IJ SOLN
1.0000 mg | INTRAMUSCULAR | Status: DC | PRN
  Administered 2016-01-13: 2 mg via INTRAVENOUS

## 2016-01-13 MED ORDER — OXYCODONE-ACETAMINOPHEN 5-325 MG PO TABS: 1.0000 | ORAL_TABLET | ORAL | 0 refills | Status: DC | PRN

## 2016-01-13 MED ORDER — SCOPOLAMINE 1 MG/3DAYS TD PT72
MEDICATED_PATCH | TRANSDERMAL | Status: AC
  Filled 2016-01-13: qty 1

## 2016-01-13 MED ORDER — PROPOFOL 10 MG/ML IV BOLUS
INTRAVENOUS | Status: DC | PRN
  Administered 2016-01-13: 180 mg via INTRAVENOUS

## 2016-01-13 MED ORDER — ONDANSETRON HCL 4 MG/2ML IJ SOLN
INTRAMUSCULAR | Status: AC
Start: 2016-01-13 — End: 2016-01-13
  Filled 2016-01-13: qty 2

## 2016-01-13 MED ORDER — FENTANYL CITRATE (PF) 100 MCG/2ML IJ SOLN
INTRAMUSCULAR | Status: DC | PRN
  Administered 2016-01-13: 150 ug via INTRAVENOUS

## 2016-01-13 MED ORDER — FENTANYL CITRATE (PF) 250 MCG/5ML IJ SOLN
INTRAMUSCULAR | Status: AC
  Filled 2016-01-13: qty 5

## 2016-01-13 MED ORDER — CEFAZOLIN SODIUM-DEXTROSE 2-4 GM/100ML-% IV SOLN
2.0000 g | INTRAVENOUS | Status: AC
  Administered 2016-01-13: 2 g via INTRAVENOUS
  Filled 2016-01-13: qty 100

## 2016-01-13 MED ORDER — DEXAMETHASONE SODIUM PHOSPHATE 4 MG/ML IJ SOLN
4.0000 mg | Freq: Once | INTRAMUSCULAR | Status: AC
  Administered 2016-01-13: 4 mg via INTRAVENOUS

## 2016-01-13 MED ORDER — SCOPOLAMINE 1 MG/3DAYS TD PT72
1.0000 | MEDICATED_PATCH | Freq: Once | TRANSDERMAL | Status: DC
  Administered 2016-01-13: 1.5 mg via TRANSDERMAL

## 2016-01-13 MED ORDER — MIDAZOLAM HCL 2 MG/2ML IJ SOLN
INTRAMUSCULAR | Status: AC
  Filled 2016-01-13: qty 2

## 2016-01-13 MED ORDER — NEOSTIGMINE METHYLSULFATE 10 MG/10ML IV SOLN
INTRAVENOUS | Status: DC | PRN
  Administered 2016-01-13: 4 mg via INTRAVENOUS

## 2016-01-13 MED ORDER — SUCCINYLCHOLINE CHLORIDE 20 MG/ML IJ SOLN
INTRAMUSCULAR | Status: AC
  Filled 2016-01-13: qty 1

## 2016-01-13 MED ORDER — ROCURONIUM BROMIDE 100 MG/10ML IV SOLN
INTRAVENOUS | Status: DC | PRN
  Administered 2016-01-13: 35 mg via INTRAVENOUS

## 2016-01-13 MED ORDER — LACTATED RINGERS IV SOLN
INTRAVENOUS | Status: DC
  Administered 2016-01-13: 1000 mL via INTRAVENOUS
  Administered 2016-01-13: 08:00:00 via INTRAVENOUS

## 2016-01-13 MED ORDER — GLYCOPYRROLATE 0.2 MG/ML IJ SOLN
INTRAMUSCULAR | Status: AC
  Filled 2016-01-13: qty 1

## 2016-01-13 MED ORDER — GLYCOPYRROLATE 0.2 MG/ML IJ SOLN
INTRAMUSCULAR | Status: DC | PRN
  Administered 2016-01-13: 0.6 mg via INTRAVENOUS

## 2016-01-13 SURGICAL SUPPLY — 31 items
BAG HAMPER (MISCELLANEOUS) ×2 IMPLANT
CATH ROBINSON RED A/P 16FR (CATHETERS) ×2 IMPLANT
CLOTH BEACON ORANGE TIMEOUT ST (SAFETY) ×2 IMPLANT
COVER LIGHT HANDLE STERIS (MISCELLANEOUS) ×4 IMPLANT
DECANTER SPIKE VIAL GLASS SM (MISCELLANEOUS) ×2 IMPLANT
DRAPE HALF SHEET 40X57 (DRAPES) ×2 IMPLANT
DRAPE PROXIMA HALF (DRAPES) ×2 IMPLANT
DRAPE STERI URO 9X17 APER PCH (DRAPES) ×2 IMPLANT
ELECT REM PT RETURN 9FT ADLT (ELECTROSURGICAL) ×2
ELECTRODE REM PT RTRN 9FT ADLT (ELECTROSURGICAL) ×1 IMPLANT
FORMALIN 10 PREFIL 480ML (MISCELLANEOUS) ×2 IMPLANT
GAUZE PACKING 2X5 YD STRL (GAUZE/BANDAGES/DRESSINGS) ×2 IMPLANT
GLOVE BIOGEL PI IND STRL 7.0 (GLOVE) ×1 IMPLANT
GLOVE BIOGEL PI IND STRL 9 (GLOVE) ×1 IMPLANT
GLOVE BIOGEL PI INDICATOR 7.0 (GLOVE) ×1
GLOVE BIOGEL PI INDICATOR 9 (GLOVE) ×1
GLOVE ECLIPSE 9.0 STRL (GLOVE) ×4 IMPLANT
GOWN SPEC L3 XXLG W/TWL (GOWN DISPOSABLE) ×2 IMPLANT
GOWN STRL REUS W/TWL LRG LVL3 (GOWN DISPOSABLE) ×2 IMPLANT
KIT ROOM TURNOVER AP CYSTO (KITS) ×2 IMPLANT
MANIFOLD NEPTUNE II (INSTRUMENTS) ×2 IMPLANT
NEEDLE HYPO 25X1 1.5 SAFETY (NEEDLE) ×2 IMPLANT
NS IRRIG 1000ML POUR BTL (IV SOLUTION) ×2 IMPLANT
PACK PERI GYN (CUSTOM PROCEDURE TRAY) ×2 IMPLANT
PAD ARMBOARD 7.5X6 YLW CONV (MISCELLANEOUS) ×2 IMPLANT
SET BASIN LINEN APH (SET/KITS/TRAYS/PACK) ×2 IMPLANT
SUT CHROMIC 2 0 CT 1 (SUTURE) ×2 IMPLANT
SUT VIC AB 0 CT2 8-18 (SUTURE) ×2 IMPLANT
SYR CONTROL 10ML LL (SYRINGE) ×2 IMPLANT
TRAY FOLEY CATH SILVER 16FR (SET/KITS/TRAYS/PACK) ×2 IMPLANT
WATER STERILE IRR 1000ML POUR (IV SOLUTION) ×2 IMPLANT

## 2016-01-13 NOTE — Op Note (Signed)
01/13/2016  8:57 AM  PATIENT:  Sabrina Beasley  55 y.o. female  PRE-OPERATIVE DIAGNOSIS:  Symptomatic Rectocele  POST-OPERATIVE DIAGNOSIS:  Symptomatic Rectocele  PROCEDURE:  Procedure(s): POSTERIOR VAGINAL REPAIR (RECTOCELE) (N/A)  SURGEON:  Surgeon(s) and Role:    * Jonnie Kind, MD - Primary  PHYSICIAN ASSISTANT:   ASSISTANTS: [place] RNFA   ANESTHESIA:   local and general  EBL:  Total I/O In: 1000 [I.V.:1000] Out: 250 [Urine:200; Blood:50]  BLOOD ADMINISTERED:none  DRAINS: none and Betadine vaginal packing to be removed in recovery room   LOCAL MEDICATIONS USED:  MARCAINE    and Amount: 20 ml  SPECIMEN:  No Specimen  DISPOSITION OF SPECIMEN:  N/A  COUNTS:  YES  TOURNIQUET:  * No tourniquets in log *  DICTATION: .Dragon Dictation  PLAN OF CARE: Discharge to home after PACU  PATIENT DISPOSITION:  PACU - hemodynamically stable.   Delay start of Pharmacological VTE agent (>24hrs) due to surgical blood loss or risk of bleeding: not applicable Details of procedure patient was taken to the operating room prepped and draped for vaginal procedure with legs supported in candycane leg supports after timeout conducted we proceeded with infiltration of the perineal body with Marcaine 10-15 cc of half percent Marcaine, with epinephrine,, and double gloved digital rectal exam was performed in order to assess the location of the perineal weakness. We then grasped the perineal body with Allis clamps at 4 and 8:00, and made a midline and split in the vaginal epithelium for distance of 3 cm of the posterior perineal body. Sharp dissection of the vaginal epithelium off the underlying supportive tissues was then performed, and for distance of 2-3 additional centimeters up the posterior peritoneum under the vaginal epithelium. Allis clamps could then be used to grasp lateral  Vaginal tissues that could be pulled into the midline in a series of mattress sutures were then sutured together  resulting in a reinforced perineal body. Double gloved right index finger was then placed in the rectum again and improvement in perineal laxity assessed. Felt that additional work was necessary close to the introitus, and with the double gloved finger in place beneath the vaginal bed, we placed 2 additional horizontal mattress sutures at the perineal body mass above the anal sphincter, pulling the and reinforcing the perineal body significantly. She'll epithelium was then trimmed and a continuous running 2-0 chromic suture used to pull the epithelium back together. A total of 8 sutures of 0 Vicryl were used to reinforce the perineal body and after changing of gloves as appropriate, the support was inspected and confirmed as being significantly improved. Vaginal packing was placed. The patient had had the catheter in the bladder in and out catheterization performed at the start of the case. This was not repeated. He'll 25 cc. Condition to recovery room good. Vaginal packing will be removed prior to discharge home

## 2016-01-13 NOTE — Anesthesia Procedure Notes (Addendum)
Procedure Name: Intubation Date/Time: 01/13/2016 7:40 AM Performed by: Gershon Mussel, Shona Pardo Pre-anesthesia Checklist: Patient identified, Patient being monitored, Timeout performed, Emergency Drugs available and Suction available Patient Re-evaluated:Patient Re-evaluated prior to inductionOxygen Delivery Method: Circle System Utilized Preoxygenation: Pre-oxygenation with 100% oxygen Intubation Type: IV induction Ventilation: Mask ventilation without difficulty Laryngoscope Size: Miller and 2 Grade View: Grade I Tube type: Oral Tube size: 7.0 mm Number of attempts: 1 Airway Equipment and Method: Stylet Placement Confirmation: ETT inserted through vocal cords under direct vision,  positive ETCO2 and breath sounds checked- equal and bilateral Secured at: 21 cm Tube secured with: Tape Dental Injury: Teeth and Oropharynx as per pre-operative assessment

## 2016-01-13 NOTE — H&P (Signed)
Jonnie Kind, MD  Obstetrics/Gynecology  Expand All Collapse All   [] Hide copied text [] Hover for attribution information Patient ID: Sabrina Beasley, female   DOB: [birth year], 55 y.o.   MRN: [id number]  Preoperative History and Physical  Sabrina Beasley is a 55 y.o. No obstetric history on file. here for surgical management of rectocele. Pt states she used the splinting technique as advised, and reports that this helped her pass BMs. She reports that she occasionally has SUI with coughing, and occasionally wears pads for protection.  Pt also notes that she occasionally experiences dyspareunia.   She also notes she has recurrent painful areas of swelling to the vulva, which she has previously had to have lanced.   Proposed surgery: posterior repair       Past Medical History:  Diagnosis Date  . Anxiety   . Arthritis   . Carpal tunnel syndrome on both sides   . Constipation   . DDD (degenerative disc disease), cervical   . Depression   . Diabetes mellitus without complication ([place])   . GERD (gastroesophageal reflux disease)   . Gout   . History of rheumatic fever   . Hx of hepatitis C   . Hyperlipidemia   . Kidney stones   . Osteoporosis   . PONV (postoperative nausea and vomiting)         Past Surgical History:  Procedure Laterality Date  . ABDOMINAL HYSTERECTOMY    . CARPAL TUNNEL RELEASE     right wrist X2  . CARPAL TUNNEL RELEASE Left 02/23/2013   Procedure: CARPAL TUNNEL RELEASE;  Surgeon: Carole Civil, MD;  Location: [facility];  Service: Orthopedics;  Laterality: Left;  . CHOLECYSTECTOMY     OB History  No data available  Patient denies any other pertinent gynecologic issues.         Current Outpatient Prescriptions on File Prior to Visit  Medication Sig Dispense Refill  . acetaminophen (TYLENOL) 500 MG tablet Take 1,000 mg by mouth every 6 (six) hours as needed for moderate pain.    Marland Kitchen atenolol (TENORMIN) 25 MG tablet Take 25 mg  by mouth daily.    . citalopram (CELEXA) 40 MG tablet Take 40 mg by mouth daily.      Marland Kitchen HYDROcodone-acetaminophen (NORCO/VICODIN) 5-325 MG tablet Take 1 tablet by mouth every 4 (four) hours as needed for moderate pain (Must last 30 days.  Do not take and drive a car or use machinery.). 120 tablet 0  . nabumetone (RELAFEN) 750 MG tablet Take 1 tablet (750 mg total) by mouth 2 (two) times daily. One by mouth twice a day after eating. 60 tablet 5  . pantoprazole (PROTONIX) 40 MG tablet Take 1 tablet by mouth daily.    . polyethylene glycol (MIRALAX / GLYCOLAX) packet Take 17 g by mouth 2 (two) times daily.    . simvastatin (ZOCOR) 40 MG tablet Take 40 mg by mouth at bedtime.      . traZODone (DESYREL) 150 MG tablet Take 150 mg by mouth at bedtime.       No current facility-administered medications on file prior to visit.         Allergies  Allergen Reactions  . Nitrofurantoin Monohyd Macro Nausea And Vomiting    Also gets shaky  . Sulfa Antibiotics Rash    Social History:   reports that she has been smoking Cigarettes.  She started smoking about 37 years ago. She has a 5.00 pack-year smoking history. She has never used  smokeless tobacco. She reports that she does not drink alcohol or use drugs.       Family History  Problem Relation Age of Onset  . Hypertension Mother   . Hyperlipidemia Mother   . Heart disease Father     Review of Systems:  +dyspareunia, unchanged   +mild SUI  PHYSICAL EXAM: Blood pressure 130/80, pulse 73, height 4\' 11"  (1.499 m), weight 162 lb 9.6 oz (73.8 kg). General appearance - alert, well appearing, and in no distress Chest - clear to auscultation, no wheezes, rales or rhonchi, symmetric air entry Heart - normal rate and regular rhythm Abdomen - soft, nontender, nondistended, no masses or organomegaly Pelvic -  VULVA: multi person introitus , otherwise normal appearing vulva with no masses, tenderness or lesions VAGINA: normal  appearing vagina with normal color and discharge, no lesions, adequate support of vaginal cuff, anterior vaginal wall and anterior wall of bladder. Cuff is mildly tender, unchanged. short vaginal length CERVIX: surgically absent,  UTERUS: surgically absent, vaginal cuff well healed,  ADNEXA: surgically absent bilateral.  Rectal- 3 cm rectocele pouch to 90 degrees  Extremities - peripheral pulses normal, no pedal edema, no clubbing or cyanosis  Labs: No results found for this or any previous visit (from the past 336 hour(s)).  Imaging Studies: ImagingResults  No results found.    Assessment:     Patient Active Problem List   Diagnosis Date Noted  . Diabetes mellitus without complication ([place]) 0000000  . S/P carpal tunnel release 02/26/2013  . CTS (carpal tunnel syndrome) 02/14/2013  . Arm numbness 11/06/2010    Plan: Patient will undergo surgical management with posterior repair. To be scheduled on or after 01/05/16.     Marland Kitchenmec 12/17/2015 2:08 PM    By signing my name below, I, Hansel Feinstein, attest that this documentation has been prepared under the direction and in the presence of Jonnie Kind, MD. Electronically Signed: Hansel Feinstein, ED Scribe. 12/17/15. 2:26 PM.  I personally performed the services described in this documentation, which was SCRIBED in my presence. The recorded information has been reviewed and considered accurate. It has been edited as necessary during review. Jonnie Kind, MD Time : >25 minutes, over 50%in direct contact and counselling, as preop visit.

## 2016-01-13 NOTE — Brief Op Note (Signed)
01/13/2016  8:57 AM  PATIENT:  Sabrina Beasley  55 y.o. female  PRE-OPERATIVE DIAGNOSIS:  Symptomatic Rectocele  POST-OPERATIVE DIAGNOSIS:  Symptomatic Rectocele  PROCEDURE:  Procedure(s): POSTERIOR VAGINAL REPAIR (RECTOCELE) (N/A)  SURGEON:  Surgeon(s) and Role:    * Jonnie Kind, MD - Primary  PHYSICIAN ASSISTANT:   ASSISTANTS: [place] RNFA   ANESTHESIA:   local and general  EBL:  Total I/O In: 1000 [I.V.:1000] Out: 250 [Urine:200; Blood:50]  BLOOD ADMINISTERED:none  DRAINS: none and Betadine vaginal packing to be removed in recovery room   LOCAL MEDICATIONS USED:  MARCAINE    and Amount: 20 ml  SPECIMEN:  No Specimen  DISPOSITION OF SPECIMEN:  N/A  COUNTS:  YES  TOURNIQUET:  * No tourniquets in log *  DICTATION: .Dragon Dictation  PLAN OF CARE: Discharge to home after PACU  PATIENT DISPOSITION:  PACU - hemodynamically stable.   Delay start of Pharmacological VTE agent (>24hrs) due to surgical blood loss or risk of bleeding: not applicable

## 2016-01-13 NOTE — Anesthesia Postprocedure Evaluation (Signed)
Anesthesia Post Note  Patient: Sabrina Beasley  Procedure(s) Performed: Procedure(s) (LRB): POSTERIOR VAGINAL REPAIR (RECTOCELE) (N/A)  Patient location during evaluation: PACU Anesthesia Type: General Level of consciousness: awake, patient cooperative and responds to stimulation Pain management: pain level controlled Vital Signs Assessment: post-procedure vital signs reviewed and stable Respiratory status: spontaneous breathing, respiratory function stable and non-rebreather facemask Cardiovascular status: stable Anesthetic complications: no    Last Vitals:  Vitals:   01/13/16 0700 01/13/16 0715  BP: 128/77 120/78  Resp: 15 16  Temp:      Last Pain:  Vitals:   01/13/16 0657  TempSrc: Oral                 Caston Coopersmith

## 2016-01-13 NOTE — Discharge Instructions (Signed)
Anterior and Posterior Colporrhaphy, Care After Refer to this sheet in the next few weeks. These instructions provide you with information on caring for yourself after your procedure. Your health care provider may also give you more specific instructions. Your treatment has been planned according to current medical practices, but problems sometimes occur. Call your health care provider if you have any problems or questions after your procedure. HOME CARE INSTRUCTIONS   Take frequent rest periods throughout the day.   Only take over-the-counter or prescription medicines as directed by your health care provider.   Avoid strenuous activity such as heavy lifting (more than 10 pounds [4.5 kg]), pushing, and pulling until your health care provider says it is okay.   Take showers if your health care provider approves. Pat incisions dry. Do not rub incisions with a washcloth or towel. Do not take tub baths until your health care provider approves.   Wear compression stockings as directed by your health care provider. These stockings help prevent blood clots from forming in your legs.   Talk with your health care provider about when you may return to work and your exercise routine.   Do not drive until your health care provider approves.   You may resume your normal diet. Eat a well-balanced diet.   Drink enough fluids to keep your urine clear or pale yellow.   Your normal bowel function should return. If you become constipated, you may:   Take a mild laxative.   Add fruit and bran to your diet.   Drink more fluids.  Do not have sexual intercourse until permitted by your health care provider.  Follow up with your health care provider as directed. SEEK MEDICAL CARE IF: You have persistent nausea or vomiting.  SEEK IMMEDIATE MEDICAL CARE IF:   You have increased bleeding (more than a small spot) from the vaginal area.   Your pain is not relieved with medicine or becomes  worse.   You have redness, swelling, or increasing pain in the vaginal area.   You have abdominal pain.   You see pus coming from the wounds.   You develop a fever.   You have a foul smell coming from your vaginal area.   You develop light-headedness or you feel faint.   You have difficulty breathing.  MAKE SURE YOU:  Understand these instructions.  Will watch your condition.  Will get help right away if you are not doing well or get worse.   This information is not intended to replace advice given to you by your health care provider. Make sure you discuss any questions you have with your health care provider.   Document Released: 11/12/2004 Document Revised: 12/27/2012 Document Reviewed: 09/15/2012 Elsevier Interactive Patient Education 2016 Elsevier Inc[employer]   Anterior and Posterior Colporrhaphy Anterior or posterior colporrhaphy is surgery to fix a prolapse of organs in the genital tract. Prolapse means the falling down, bulging, dropping, or drooping of an organ. Organs that commonly prolapse include the rectum, bladder, vagina, and uterus. Prolapse can affect a single organ or several organs at the same time. This often worsens when women stop having their monthly periods (menopause) because estrogen loss weakens the muscles and tissues in the genital tract. In addition, prolapse happens when the organs are damaged or weakened. This commonly happens after childbirth and as a result of aging. Surgery is often done for severe prolapses.  The type of colporrhaphy done depends on the type of genital prolapse. Types of genital prolapse include  the following:   Cystocele. This is a prolapse of the upper (anterior) wall of the vagina. The anterior wall bulges into the vagina and brings the bladder with it.   Rectocele. This is a prolapse of the lower (posterior) wall of the vagina. The posterior vaginal wall bulges into the vagina and brings the rectum with it.   Enterocele.  This is a prolapse of part of the pelvic organs called the pouch of Douglas. It also involves a portion of the small bowel. It appears as a bulge under the neck of the uterus at the top of the back wall of the vagina.   Procidentia. This is a complete prolapse of the uterus and the cervix. The prolapse can be seen and felt coming out of the vagina. LET [facility] PROVIDER KNOW ABOUT:   Any allergies you have.   All medicines you are taking, including vitamins, herbs, eye drops, creams, and over-the-counter medicines.   Previous problems you or members of your family have had with the use of anesthetics.   Any blood disorders you have.   Previous surgeries you have had.   Medical conditions you have.   Smoking history or history of alcohol use.   Possibility of pregnancy, if this applies.  RISKS AND COMPLICATIONS Generally, anterior or posterior colporrhaphy is a safe procedure. However, as with any procedure, complications can occur. Possible complications include:   Infection.   Damage to other organs during surgery.   Bleeding after surgery.   Problems urinating.   Problems from the anesthetic.  BEFORE THE PROCEDURE  Ask your health care provider about changing or stopping your regular medicines.   Do not eat or drink anything for at least 8 hours before the surgery.   If you smoke, do not smoke for at least 2 weeks before the surgery.   Make plans to have someone drive you home after your hospital stay. Also, arrange for someone to help you with activities during recovery. PROCEDURE  You may be given medicine to help you relax before the surgery (sedative). During the surgery you will be given medicine to make you sleep through the procedure (general anesthetic) or medicine to numb you from the waist down (spinal anesthetic). This medicine will be given through an intravenous (IV) access tube that is put into one of your veins.  The procedure will  vary depending on the type of repair:   Anterior repair. A cut (incision) is made in the midline section of the front part of the vaginal wall. A triangular-shaped piece of vaginal tissue is removed, and the stronger, healthier tissue is sewn together in order to support and suspend the bladder.   Posterior repair. An incision is made midline on the back wall of the vagina. A triangular portion of vaginal skin is removed to expose the muscle. Excess tissue is removed, and stronger, healthier muscle and ligament tissue is sewn together to support the rectum.   Anterior and posterior repair. Both procedures are done during the same surgery. AFTER THE PROCEDURE You will be taken to a recovery area. Your blood pressure, pulse, breathing, and temperature (vital signs) will be monitored. This is done until you are stable. Then you will be transferred to a hospital room.  After surgery, you will have a small rubber tube in place to drain your bladder (urinary catheter). This will be in place for 2 to 7 days or until your bladder is working properly on its own. The IV access  tube will be removed in 1 to 3 days. You may have a gauze packing in your vagina to prevent bleeding. This will be removed 2 or 3 days after the surgery. You will likely need to stay in the hospital for 3 to 5 days.    This information is not intended to replace advice given to you by your health care provider. Make sure you discuss any questions you have with your health care provider.   Document Released: 07/17/2003 Document Revised: 12/27/2012 Document Reviewed: 09/15/2012 Elsevier Interactive Patient Education [employer].

## 2016-01-13 NOTE — Anesthesia Procedure Notes (Deleted)
Performed by: Michele Rockers

## 2016-01-13 NOTE — Transfer of Care (Signed)
Immediate Anesthesia Transfer of Care Note  Patient: Sabrina Beasley  Procedure(s) Performed: Procedure(s): POSTERIOR VAGINAL REPAIR (RECTOCELE) (N/A)  Patient Location: PACU  Anesthesia Type:General  Level of Consciousness: awake, patient cooperative and responds to stimulation  Airway & Oxygen Therapy: Patient Spontanous Breathing and Patient connected to face mask oxygen  Post-op Assessment: Report given to RN, Post -op Vital signs reviewed and stable and Patient moving all extremities X 4  Post vital signs: Reviewed and stable  Last Vitals:  Vitals:   01/13/16 0700 01/13/16 0715  BP: 128/77 120/78  Resp: 15 16  Temp:      Last Pain:  Vitals:   01/13/16 0657  TempSrc: Oral         Complications: No apparent anesthesia complications

## 2016-01-13 NOTE — Anesthesia Preprocedure Evaluation (Signed)
Anesthesia Evaluation  Patient identified by MRN, date of birth, ID band Patient awake    Reviewed: Allergy & Precautions, H&P , NPO status , Patient's Chart, lab work & pertinent test results  History of Anesthesia Complications (+) PONV and history of anesthetic complications  Airway Mallampati: I  TM Distance: >3 FB Neck ROM: Full    Dental  (+) Teeth Intact   Pulmonary Current Smoker,    breath sounds clear to auscultation       Cardiovascular negative cardio ROS   Rhythm:Regular Rate:Normal     Neuro/Psych PSYCHIATRIC DISORDERS Anxiety Depression  Neuromuscular disease    GI/Hepatic GERD  ,(+) Hepatitis -, C  Endo/Other  diabetes, Type 2  Renal/GU      Musculoskeletal   Abdominal   Peds  Hematology   Anesthesia Other Findings   Reproductive/Obstetrics                             Anesthesia Physical Anesthesia Plan  ASA: III  Anesthesia Plan: General   Post-op Pain Management:    Induction: Intravenous  Airway Management Planned: Oral ETT  Additional Equipment:   Intra-op Plan:   Post-operative Plan: Extubation in OR  Informed Consent: I have reviewed the patients History and Physical, chart, labs and discussed the procedure including the risks, benefits and alternatives for the proposed anesthesia with the patient or authorized representative who has indicated his/her understanding and acceptance.     Plan Discussed with:   Anesthesia Plan Comments:         Anesthesia Quick Evaluation

## 2016-01-15 ENCOUNTER — Telehealth: Payer: Self-pay | Admitting: Obstetrics and Gynecology

## 2016-01-15 NOTE — Telephone Encounter (Signed)
Pt c/o itching in rectal, had rectocele repair on 01/13/2016. Pt informed per Dr. Glo Herring can use Neosporin ointment. Pt verbalized understanding.

## 2016-01-19 ENCOUNTER — Encounter (HOSPITAL_COMMUNITY): Payer: Self-pay | Admitting: Obstetrics and Gynecology

## 2016-01-21 ENCOUNTER — Encounter: Payer: Self-pay | Admitting: Obstetrics and Gynecology

## 2016-01-21 ENCOUNTER — Ambulatory Visit (INDEPENDENT_AMBULATORY_CARE_PROVIDER_SITE_OTHER): Payer: Medicare Other | Admitting: Obstetrics and Gynecology

## 2016-01-21 VITALS — BP 128/78 | HR 58 | Ht 59.0 in | Wt 159.6 lb

## 2016-01-21 DIAGNOSIS — R109 Unspecified abdominal pain: Secondary | ICD-10-CM | POA: Diagnosis not present

## 2016-01-21 LAB — POCT URINALYSIS DIPSTICK
Blood, UA: NEGATIVE
Glucose, UA: NEGATIVE
Ketones, UA: NEGATIVE
Leukocytes, UA: NEGATIVE
Nitrite, UA: NEGATIVE
Protein, UA: NEGATIVE

## 2016-01-21 MED ORDER — MICONAZOLE NITRATE 2 % VA CREA: 1.0000 | TOPICAL_CREAM | Freq: Every day | VAGINAL | Status: DC

## 2016-01-21 NOTE — Progress Notes (Signed)
Patient ID: Sabrina Beasley, female   DOB: 1961-02-09, 55 y.o.   MRN: [id number]  Subjective:  Sabrina Beasley is a 55 y.o. female now 8 days status post posterior vaginal repair.   Chief Complaint  Patient presents with  . Routine Post Op    c/o lower back pain and abdominal pain, thinks she may have a UTI      Pt states her bowel movements have improved since the procedure. Pt also notes she occasionally experiences mild dizziness during her recovery. Pt states she is currently taking BP medications. SHE IS AFEBRILE  Pt states the first few days after the procedure, she began to experience the sensation of incomplete voiding. The states this symptom resolved and was followed by lower back and abdominal pressure as well as vaginal itching.   Review of Systems Negative except lower back and abdominal pressure, vaginal itching, occasional dizziness     Diet:   Normal   Bowel movements : normal.  Pain is controlled without any medications.  Objective:  BP 128/78   Pulse (!) 58   Ht 4\' 11"  (1.499 m)   Wt 159 lb 9.6 oz (72.4 kg)   BMI 32.24 kg/m  General:Well developed, well nourished.  No acute distress. Abdomen: Bowel sounds normal, soft, non-tender. Pelvic Exam:    External Genitalia:  Normal.    Vagina: Normal   Incision(s):   Healing well, no drainage, no erythema, no hernia, no swelling, no dehiscence, moderate tenderness, normal post-op changes      Assessment:  Post-Op  8 days status post posterior vaginal repair.  UA today completely clear   Doing well postoperatively.   Plan:  1.Wound care discussed   2. . current medications: stool softener, monistat OTC for vaginal itching   3. Activity restrictions: may shower or soak in bath  4. return to work: not applicable. 5. Follow up in 4 weeks.   By signing my name below, I, Hansel Feinstein, attest that this documentation has been prepared under the direction and in the presence of Jonnie Kind, MD. Electronically Signed:  Hansel Feinstein, ED Scribe. 01/21/16. 11:37 AM.  I personally performed the services described in this documentation, which was SCRIBED in my presence. The recorded information has been reviewed and considered accurate. It has been edited as necessary during review. Jonnie Kind, MD

## 2016-02-18 ENCOUNTER — Encounter: Payer: Medicare Other | Admitting: Obstetrics and Gynecology

## 2016-02-26 ENCOUNTER — Encounter: Payer: Self-pay | Admitting: Obstetrics and Gynecology

## 2016-02-26 ENCOUNTER — Ambulatory Visit (INDEPENDENT_AMBULATORY_CARE_PROVIDER_SITE_OTHER): Payer: Medicare Other | Admitting: Obstetrics and Gynecology

## 2016-02-26 VITALS — BP 132/78 | HR 56 | Ht 59.0 in | Wt 161.6 lb

## 2016-02-26 DIAGNOSIS — Z9889 Other specified postprocedural states: Secondary | ICD-10-CM

## 2016-02-26 DIAGNOSIS — N816 Rectocele: Secondary | ICD-10-CM

## 2016-02-26 NOTE — Progress Notes (Signed)
   Subjective:  Sabrina Beasley is a 55 y.o. female now 6 weeks 2 days status post posterior vaginal repair (rectocele). Pt notes that she is having associated symptoms of mild vaginal spotting while wiping. Pt has not tried any other medications for the relief of her symptoms. Pt denies any other symptoms.   Review of Systems Negative except mild vaginal spotting while wiping   Diet:     Bowel movements : normal.  The patient is not having any pain.  Objective:  BP 132/78   Pulse (!) 56   Ht 4\' 11"  (1.499 m)   Wt 161 lb 9.6 oz (73.3 kg)   BMI 32.64 kg/m  General:Well developed, well nourished.  No acute distress. Abdomen: Bowel sounds normal, soft, non-tender. Pelvic Exam:    External Genitalia:  Normal.    Vagina: Normal    Cervix: Normal    Uterus: Normal    Adnexa/Bimanual: Normal  Incision(s):   Healing well, no drainage, no erythema, no hernia, no swelling, no dehiscence,     Assessment:  Post-Op 6 weeks 2 days s/p posterior vaginal repair (rectocele)   Doing well postoperatively.    Plan:  1.Wound care discussed   2. Post op directions for intimacy discussed in detail..  3. current medications stool softener to continue 4. Activity restrictions: none 5. return to work: not applicable. 6. Follow up in 1 year or PRN.  By signing my name below, I, Soijett Blue, attest that this documentation has been prepared under the direction and in the presence of Jonnie Kind, MD. Electronically Signed: Soijett Blue, ED Scribe. 02/26/16. 10:27 AM.  I personally performed the services described in this documentation, which was SCRIBED in my presence. The recorded information has been reviewed and considered accurate. It has been edited as necessary during review. Jonnie Kind, MD

## 2016-03-08 ENCOUNTER — Other Ambulatory Visit: Payer: Self-pay | Admitting: Urology

## 2016-03-24 ENCOUNTER — Encounter (HOSPITAL_BASED_OUTPATIENT_CLINIC_OR_DEPARTMENT_OTHER): Payer: Self-pay | Admitting: *Deleted

## 2016-03-30 ENCOUNTER — Ambulatory Visit (HOSPITAL_BASED_OUTPATIENT_CLINIC_OR_DEPARTMENT_OTHER): Admission: RE | Admit: 2016-03-30 | Payer: Medicare Other | Source: Ambulatory Visit | Admitting: Urology

## 2016-03-30 ENCOUNTER — Encounter (HOSPITAL_BASED_OUTPATIENT_CLINIC_OR_DEPARTMENT_OTHER): Admission: RE | Payer: Self-pay | Source: Ambulatory Visit

## 2016-03-30 HISTORY — DX: Personal history of other diseases of the female genital tract: Z87.42

## 2016-03-30 HISTORY — DX: Other cervical disc displacement, unspecified cervical region: M50.20

## 2016-03-30 HISTORY — DX: Unspecified osteoarthritis, unspecified site: M19.90

## 2016-03-30 HISTORY — DX: Family history of ischemic heart disease and other diseases of the circulatory system: Z82.49

## 2016-03-30 HISTORY — DX: Calculus of ureter: N20.1

## 2016-03-30 HISTORY — DX: Other cervical disc degeneration, unspecified cervical region: M50.30

## 2016-03-30 SURGERY — CYSTOSCOPY/URETEROSCOPY/HOLMIUM LASER/STENT PLACEMENT
Anesthesia: General

## 2016-04-19 ENCOUNTER — Other Ambulatory Visit: Payer: Self-pay | Admitting: Urology

## 2016-05-03 ENCOUNTER — Emergency Department (HOSPITAL_COMMUNITY): Payer: Medicare Other

## 2016-05-03 ENCOUNTER — Encounter (HOSPITAL_COMMUNITY): Payer: Self-pay | Admitting: Emergency Medicine

## 2016-05-03 ENCOUNTER — Emergency Department (HOSPITAL_COMMUNITY)
Admission: EM | Admit: 2016-05-03 | Discharge: 2016-05-03 | Disposition: A | Discharge status: Home / Self Care | Payer: Medicare Other | Attending: Emergency Medicine | Admitting: Emergency Medicine

## 2016-05-03 DIAGNOSIS — N201 Calculus of ureter: Secondary | ICD-10-CM | POA: Insufficient documentation

## 2016-05-03 DIAGNOSIS — E119 Type 2 diabetes mellitus without complications: Secondary | ICD-10-CM | POA: Diagnosis not present

## 2016-05-03 DIAGNOSIS — R109 Unspecified abdominal pain: Secondary | ICD-10-CM | POA: Diagnosis present

## 2016-05-03 DIAGNOSIS — F17210 Nicotine dependence, cigarettes, uncomplicated: Secondary | ICD-10-CM | POA: Diagnosis not present

## 2016-05-03 DIAGNOSIS — Z79899 Other long term (current) drug therapy: Secondary | ICD-10-CM | POA: Insufficient documentation

## 2016-05-03 LAB — CBC WITH DIFFERENTIAL/PLATELET
Basophils Absolute: 0 10*3/uL (ref 0.0–0.1)
Basophils Relative: 0 %
Eosinophils Absolute: 0.1 10*3/uL (ref 0.0–0.7)
Eosinophils Relative: 1 %
HCT: 35.6 % — ABNORMAL LOW (ref 36.0–46.0)
Hemoglobin: 12.3 g/dL (ref 12.0–15.0)
Lymphocytes Relative: 7 %
Lymphs Abs: 1.1 10*3/uL (ref 0.7–4.0)
MCH: 31.5 pg (ref 26.0–34.0)
MCHC: 34.6 g/dL (ref 30.0–36.0)
MCV: 91 fL (ref 78.0–100.0)
Monocytes Absolute: 0.5 10*3/uL (ref 0.1–1.0)
Monocytes Relative: 3 %
Neutro Abs: 14.1 10*3/uL — ABNORMAL HIGH (ref 1.7–7.7)
Neutrophils Relative %: 89 %
Platelets: 453 10*3/uL — ABNORMAL HIGH (ref 150–400)
RBC: 3.91 MIL/uL (ref 3.87–5.11)
RDW: 13.8 % (ref 11.5–15.5)
WBC: 15.8 10*3/uL — ABNORMAL HIGH (ref 4.0–10.5)

## 2016-05-03 LAB — COMPREHENSIVE METABOLIC PANEL
ALT: 28 U/L (ref 14–54)
AST: 31 U/L (ref 15–41)
Albumin: 4.3 g/dL (ref 3.5–5.0)
Alkaline Phosphatase: 45 U/L (ref 38–126)
Anion gap: 10 (ref 5–15)
BUN: 14 mg/dL (ref 6–20)
CO2: 25 mmol/L (ref 22–32)
Calcium: 9.7 mg/dL (ref 8.9–10.3)
Chloride: 102 mmol/L (ref 101–111)
Creatinine, Ser: 1.24 mg/dL — ABNORMAL HIGH (ref 0.44–1.00)
GFR calc Af Amer: 56 mL/min — ABNORMAL LOW (ref >60)
GFR calc non Af Amer: 49 mL/min — ABNORMAL LOW (ref >60)
Glucose, Bld: 125 mg/dL — ABNORMAL HIGH (ref 65–99)
Potassium: 3.7 mmol/L (ref 3.5–5.1)
Sodium: 137 mmol/L (ref 135–145)
Total Bilirubin: 0.3 mg/dL (ref 0.3–1.2)
Total Protein: 7.2 g/dL (ref 6.5–8.1)

## 2016-05-03 LAB — URINALYSIS, ROUTINE W REFLEX MICROSCOPIC
Bacteria, UA: NONE SEEN
Bilirubin Urine: NEGATIVE
Glucose, UA: NEGATIVE mg/dL
Ketones, ur: NEGATIVE mg/dL
Nitrite: NEGATIVE
Protein, ur: NEGATIVE mg/dL
Specific Gravity, Urine: 1.017 (ref 1.005–1.030)
pH: 5 (ref 5.0–8.0)

## 2016-05-03 MED ORDER — OXYCODONE-ACETAMINOPHEN 5-325 MG PO TABS
1.0000 | ORAL_TABLET | Freq: Once | ORAL | Status: AC
  Administered 2016-05-03: 1 via ORAL
  Filled 2016-05-03: qty 1

## 2016-05-03 MED ORDER — KETOROLAC TROMETHAMINE 30 MG/ML IJ SOLN
30.0000 mg | Freq: Once | INTRAMUSCULAR | Status: AC
  Administered 2016-05-03: 30 mg via INTRAVENOUS
  Filled 2016-05-03: qty 1

## 2016-05-03 MED ORDER — SODIUM CHLORIDE 0.9 % IV SOLN
1000.0000 mL | INTRAVENOUS | Status: DC
  Administered 2016-05-03: 1000 mL via INTRAVENOUS

## 2016-05-03 MED ORDER — SODIUM CHLORIDE 0.9 % IV SOLN
1000.0000 mL | Freq: Once | INTRAVENOUS | Status: AC
  Administered 2016-05-03: 1000 mL via INTRAVENOUS

## 2016-05-03 MED ORDER — DEXTROSE 5 % IV SOLN
1.0000 g | Freq: Once | INTRAVENOUS | Status: AC
  Administered 2016-05-03: 1 g via INTRAVENOUS
  Filled 2016-05-03: qty 10

## 2016-05-03 MED ORDER — HYDROMORPHONE HCL 1 MG/ML IJ SOLN
1.0000 mg | Freq: Once | INTRAMUSCULAR | Status: AC
Start: 2016-05-03 — End: 2016-05-03
  Administered 2016-05-03: 1 mg via INTRAVENOUS
  Filled 2016-05-03: qty 1

## 2016-05-03 MED ORDER — HYDROMORPHONE HCL 1 MG/ML IJ SOLN
1.0000 mg | Freq: Once | INTRAMUSCULAR | Status: AC
  Administered 2016-05-03: 1 mg via INTRAVENOUS
  Filled 2016-05-03: qty 1

## 2016-05-03 NOTE — ED Triage Notes (Signed)
Pt reports R sided flank and abdominal pain. Pt states she has a kidney stone. Pt c/o symptoms worsening yesterday and today. Pt c/o burning and pain with urination.

## 2016-05-03 NOTE — ED Notes (Signed)
Pt reports first stone, took a half of a percocet at 1630 and is scheduled Friday for a stent at [facility]

## 2016-05-03 NOTE — ED Notes (Signed)
Awaiting recheck and dispo

## 2016-05-03 NOTE — ED Provider Notes (Addendum)
[facility] DEPT Provider Note   CSN: [id number] Arrival date & time: 05/03/16  1802     History   Chief Complaint Chief Complaint  Patient presents with  . Flank Pain    HPI Sabrina Beasley is a 55 y.o. female.  The history is provided by the patient.    Patient has a known distal right ureteral stone and is scheduled to have ureteroscopy and stent placement on 05/07/2016 by Dr. Junious Silk.  She presents today with worsening flank pain as well as dysuria and urinary frequency with chills at home.  She has not had documented fever.  She reports nausea without vomiting today.  Because of her worsening symptoms she presents back to the emergency department for evaluation.  Her pain at this time is severe in severity   Past Medical History:  Diagnosis Date  . Anxiety   . Bulging of cervical intervertebral disc    C4-5 &  C6-7  . Constipation   . DDD (degenerative disc disease), cervical    stenosis  . Depression   . Family history of premature CAD   . GERD (gastroesophageal reflux disease)   . Gout   . History of endometriosis   . History of kidney stones   . History of ovarian cyst   . History of rheumatic fever   . Hx of hepatitis C dx 04-16-2009 liver bx   mild portal/ periportal fibrosis-- lower serum level detection positive antibody virus RNA  . Hyperlipidemia   . OA (osteoarthritis)   . Osteoporosis   . PONV (postoperative nausea and vomiting)   . Right ureteral stone     Patient Active Problem List   Diagnosis Date Noted  . Rectocele 12/23/2015  . Diabetes mellitus without complication ([place]) 0000000  . S/P carpal tunnel release 02/26/2013  . CTS (carpal tunnel syndrome) 02/14/2013  . Arm numbness 11/06/2010    Past Surgical History:  Procedure Laterality Date  . CARDIOVASCULAR STRESS TEST  06/19/2015  dr Bronson Ing   normal nuclear study, low risk w/ overall LVSF normal and wall motion normal , stress ef 57%  . CARPAL TUNNEL RELEASE Right x2  last  one  . CARPAL TUNNEL RELEASE Left 02/23/2013   Procedure: CARPAL TUNNEL RELEASE;  Surgeon: Carole Civil, MD;  Location: [facility] ORS;  Service: Orthopedics;  Laterality: Left;  . CHOLECYSTECTOMY  1999  . DX LAPAROSCOPY/  D & C HYSTEROSCOPY  2001  . LAPAROSCOPIC SALPINGO OOPHERECTOMY Right 08/24/2006  . RECTOCELE REPAIR N/A 01/13/2016   Procedure: POSTERIOR VAGINAL REPAIR (RECTOCELE);  Surgeon: Jonnie Kind, MD;  Location: [facility]S;  Service: Gynecology;  Laterality: N/A;  . TOTAL ABDOMINAL HYSTERECTOMY Left 05/22/2002   w/  Left Salpingo Oopherectomy  . TRANSTHORACIC ECHOCARDIOGRAM  06/19/2015   ef 60-65%/  trivial MR and PR  . TUBAL LIGATION  1989    OB History    Gravida Para Term Preterm AB Living   3 3 3          SAB TAB Ectopic Multiple Live Births                   Home Medications    Prior to Admission medications   Medication Sig Start Date End Date Taking? Authorizing Provider  alfuzosin (UROXATRAL) 10 MG 24 hr tablet Take 10 mg by mouth daily. 04/26/16  Yes Historical Provider, MD  allopurinol (ZYLOPRIM) 300 MG tablet Take 300 mg by mouth daily. 02/14/16  Yes Historical Provider, MD  cetirizine (ZYRTEC) 10  MG tablet Take 10 mg by mouth daily. 04/19/16  Yes Historical Provider, MD  citalopram (CELEXA) 40 MG tablet Take 40 mg by mouth daily.     Yes Historical Provider, MD  fenofibrate (TRICOR) 145 MG tablet Take 145 mg by mouth daily.   Yes Historical Provider, MD  oxyCODONE-acetaminophen (PERCOCET/ROXICET) 5-325 MG tablet Take 1 tablet by mouth every 4 (four) hours as needed for moderate pain or severe pain (Postoperative pain). As needed for postoperative pain. May alternate with tramadol Patient taking differently: Take 0.5-1 tablets by mouth every 4 (four) hours as needed for moderate pain or severe pain (Postoperative pain). As needed for postoperative pain. May alternate with tramadol 01/13/16  Yes Jonnie Kind, MD  pantoprazole (PROTONIX) 40 MG tablet Take 1 tablet by  mouth daily. 05/14/13  Yes Historical Provider, MD  polyethylene glycol (MIRALAX / GLYCOLAX) packet Take 17 g by mouth 2 (two) times daily.   Yes Historical Provider, MD  simvastatin (ZOCOR) 40 MG tablet Take 40 mg by mouth at bedtime.     Yes Historical Provider, MD  traZODone (DESYREL) 150 MG tablet Take 150 mg by mouth at bedtime.     Yes Historical Provider, MD  atenolol (TENORMIN) 25 MG tablet Take 25 mg by mouth daily.    Historical Provider, MD  estradiol (ESTRACE) 1 MG tablet Take 1 tablet (1 mg total) by mouth daily. Patient not taking: Reported on 05/03/2016 12/19/15 12/18/16  Jonnie Kind, MD    Family History Family History  Problem Relation Age of Onset  . Hypertension Mother   . Hyperlipidemia Mother   . Heart disease Father     Social History Social History  Substance Use Topics  . Smoking status: Current Every Day Smoker    Packs/day: 0.50    Years: 10.00    Types: Cigarettes    Start date: 05/10/1978  . Smokeless tobacco: Never Used  . Alcohol use No     Allergies   Nitrofurantoin monohyd macro and Sulfa antibiotics   Review of Systems Review of Systems  All other systems reviewed and are negative.    Physical Exam Updated Vital Signs BP 121/76   Pulse 81   Temp 98.3 F (36.8 C) (Oral)   Resp 16   Ht 4\' 11"  (1.499 m)   Wt 155 lb (70.3 kg)   SpO2 97%   BMI 31.31 kg/m   Physical Exam  Constitutional: She is oriented to person, place, and time. She appears well-developed and well-nourished. No distress.  HENT:  Head: Normocephalic and atraumatic.  Eyes: EOM are normal.  Neck: Normal range of motion.  Cardiovascular: Normal rate, regular rhythm and normal heart sounds.   Pulmonary/Chest: Effort normal and breath sounds normal.  Abdominal: Soft. She exhibits no distension. There is no tenderness.  Musculoskeletal: Normal range of motion.  Neurological: She is alert and oriented to person, place, and time.  Skin: Skin is warm and dry.    Psychiatric: She has a normal mood and affect. Judgment normal.  Nursing note and vitals reviewed.    ED Treatments / Results  Labs (all labs ordered are listed, but only abnormal results are displayed) Labs Reviewed  URINALYSIS, ROUTINE W REFLEX MICROSCOPIC - Abnormal; Notable for the following:       Result Value   Hgb urine dipstick LARGE (*)    Leukocytes, UA TRACE (*)    All other components within normal limits  CBC WITH DIFFERENTIAL/PLATELET - Abnormal; Notable for the following:  WBC 15.8 (*)    HCT 35.6 (*)    Platelets 453 (*)    Neutro Abs 14.1 (*)    All other components within normal limits  COMPREHENSIVE METABOLIC PANEL - Abnormal; Notable for the following:    Glucose, Bld 125 (*)    Creatinine, Ser 1.24 (*)    GFR calc non Af Amer 49 (*)    GFR calc Af Amer 56 (*)    All other components within normal limits  URINE CULTURE  I-STAT CG4 LACTIC ACID, ED    EKG  EKG Interpretation None       Radiology Ct Renal Stone Study  Result Date: 05/03/2016 CLINICAL DATA:  Right flank/abdominal pain, dysuria, kidney stone EXAM: CT ABDOMEN AND PELVIS WITHOUT CONTRAST TECHNIQUE: Multidetector CT imaging of the abdomen and pelvis was performed following the standard protocol without IV contrast. COMPARISON:  03/04/2016 FINDINGS: Lower chest: Lung bases are essentially clear. Hepatobiliary: Unenhanced liver is unremarkable. Status post cholecystectomy. No intrahepatic or extrahepatic ductal dilatation. Pancreas: Within normal limits. Spleen: Within normal limits. Adrenals/Urinary Tract: Adrenal glands are within normal limits. Left kidney is within normal limits. No renal calculi or hydronephrosis. Right kidney is notable for mild perirenal edema. No renal calculi. Moderate hydroureteronephrosis. 6 mm distal right ureteral calculus at the UVJ (coronal image 69). Bladder is within normal limits. Stomach/Bowel: Stomach is within normal limits. No evidence of bowel obstruction.  Normal appendix (series 2/ image 55). Left colon is decompressed. Vascular/Lymphatic: No evidence of abdominal aortic aneurysm. No suspicious abdominopelvic lymphadenopathy. Reproductive: Status post hysterectomy. No adnexal masses. Other: No abdominopelvic ascites. Musculoskeletal: Degenerative changes of the visualized thoracolumbar spine. Grade 2 spondylolisthesis at L5-S1. IMPRESSION: 6 mm distal right ureteral calculus at the UVJ. Associated moderate right hydroureteronephrosis. Electronically Signed   By: Julian Hy M.D.   On: 05/03/2016 19:47    Procedures Procedures (including critical care time)  Medications Ordered in ED Medications  0.9 %  sodium chloride infusion (0 mLs Intravenous Stopped 05/03/16 2149)    Followed by  0.9 %  sodium chloride infusion (0 mLs Intravenous Stopped 05/03/16 2129)  cefTRIAXone (ROCEPHIN) 1 g in dextrose 5 % 50 mL IVPB (not administered)  HYDROmorphone (DILAUDID) injection 1 mg (1 mg Intravenous Given 05/03/16 1910)  HYDROmorphone (DILAUDID) injection 1 mg (1 mg Intravenous Given 05/03/16 2144)  ketorolac (TORADOL) 30 MG/ML injection 30 mg (30 mg Intravenous Given 05/03/16 2154)     Initial Impression / Assessment and Plan / ED Course  I have reviewed the triage vital signs and the nursing notes.  Pertinent labs & imaging results that were available during my care of the patient were reviewed by me and considered in my medical decision making (see chart for details).  Clinical Course    Patient with some dysuria and urinary frequency as well as chills today without documented fever.  Concerning for possibly early signs of an infected stone.  She does have an elevated white blood cell count.  Vital signs are stable this time.  No documented temperature here in the emergency department.   10:03 PM Patient feels much better at this time.  Her pain is improved.  I spoke with on-call urology, Dr. Alinda Money who reviewed her labs and urine and at this  time he thinks that this is not clear-cut for an infected stone.  He thinks these may be symptoms of her trying to pass that distal right ureteral stone.  Urine cultures been sent.  She was given a dose  of Rocephin here in the emergency department.  At this time he believes that she is stable for discharge with strict return precautions.  Patient and family are agreeable to outpatient plan.  I've asked that she contact the urology team in the morning if she continues having symptoms.  Obviously if she is to worsen she is return to this emergency department or to the [facility] long emergency department.     Final Clinical Impressions(s) / ED Diagnoses   Final diagnoses:  Right ureteral stone    New Prescriptions New Prescriptions   No medications on file     Jola Schmidt, MD 05/03/16 [place], MD 06/08/16 [phone]

## 2016-05-03 NOTE — ED Notes (Signed)
Dr C in to reassess and speak with pt and family

## 2016-05-03 NOTE — ED Notes (Signed)
Pt refused rectal temp- Oral temp 98.3

## 2016-05-04 ENCOUNTER — Encounter (HOSPITAL_BASED_OUTPATIENT_CLINIC_OR_DEPARTMENT_OTHER): Payer: Self-pay | Admitting: *Deleted

## 2016-05-04 NOTE — Progress Notes (Signed)
Pt instructed npo pmn 12/28 x celexa, protonix, uroxatrol w sip of water.  Ok to take pain med w sip of water if needed.  To [facility] 12/29 @ 0715.  Labs in epic.

## 2016-05-05 LAB — URINE CULTURE

## 2016-05-05 NOTE — H&P (Signed)
Office Visit Report     05/05/2016   --------------------------------------------------------------------------------   Sabrina Beasley  MRN: [id number]  PRIMARY CARE:    DOB: Apr 10, 1961, 55 year old Female  REFERRING:  Burman Freestone, NP  SSN: [id number]  PROVIDER:  Festus Aloe, M.D.    TREATING:  Jiles Crocker    LOCATION:  [facility] [phone]   --------------------------------------------------------------------------------   CC: I have kidney stones.  HPI: Sabrina Beasley is a 55 year-old female established patient who is here for renal calculi.  Pt has a distal right ureteral calculus with some moderate hydronephrosis. She is scheduled for ureteroscopy on 12/29 in two days. She presents for worsening LUTS, right sided lower abdominal pain, n/v, and fevers. She was seen in the ED on Monday night for worsening/uncontrollable symptoms. Ct was performed and she received IV abx but was not prescribed any PO abx. Today she is afebrile but is requesting her procedure be moved to today if possible. Denies constipation.   The problem is on the right side. She first stated noticing pain on 05/03/2016. This is her first kidney stone. She is currently having flank pain, back pain, nausea, and fever. She denies having groin pain, vomiting, and chills.   She has never had surgical treatment for calculi in the past.     ALLERGIES: Macrobid - Skin Rash Sulfa - Skin Rash    MEDICATIONS: Allopurinol 300 mg tablet  Alfuzosin Hcl Er 10 mg tablet, extended release 24 hr 1 tablet PO Daily  Atenolol 25 mg tablet  Citalopram Hbr 40 mg tablet  Fenofibrate  Pantoprazole Sodium 40 mg tablet, delayed release  Trazodone Hcl 150 mg tablet  Zocor 40 mg tablet     GU PSH: Hysterectomy    NON-GU PSH: Carpal Tunnel Surgery, Bilateral Cholecystectomy    GU PMH: Calculus Ureter - 03/04/2016 Flank Pain - 03/04/2016 Gross hematuria - 03/04/2016 Kidney Stone    NON-GU PMH:  Depression Gout Hepatitis C Hypercholesterolemia    FAMILY HISTORY: 1 son - Other 2 daughters - Other Heart Disease - Mother   SOCIAL HISTORY: Marital Status: Married Current Smoking Status: Patient has never smoked.  Drinks 3 caffeinated drinks per day.    REVIEW OF SYSTEMS:    GU Review Female:   Patient reports frequent urination and get up at night to urinate. Patient denies hard to postpone urination, burning /pain with urination, leakage of urine, stream starts and stops, trouble starting your stream, have to strain to urinate, and currently pregnant.  Gastrointestinal (Upper):   Patient reports nausea. Patient denies vomiting and indigestion/ heartburn.  Gastrointestinal (Lower):   Patient denies diarrhea and constipation.  Constitutional:   Patient reports fever. Patient denies night sweats, weight loss, and fatigue.  Skin:   Patient denies skin rash/ lesion and itching.  Eyes:   Patient denies blurred vision and double vision.  Ears/ Nose/ Throat:   Patient denies sore throat and sinus problems.  Hematologic/Lymphatic:   Patient denies swollen glands and easy bruising.  Cardiovascular:   Patient denies leg swelling and chest pains.  Respiratory:   Patient denies shortness of breath and cough.  Endocrine:   Patient denies excessive thirst.  Musculoskeletal:   Patient denies back pain and joint pain.  Neurological:   Patient denies headaches and dizziness.  Psychologic:   Patient denies depression and anxiety.   VITAL SIGNS:      05/05/2016 11:07 AM  Weight 155 lb / 70.31 kg  BP 138/83 mmHg  Pulse 95 /min  Temperature 98.1 F / 37 C   GU PHYSICAL EXAMINATION:    External Genitalia: No hirsutism, no rash, no scarring, no cyst, no erythematous lesion, no papular lesion, no blanched lesion, no warty lesion. No edema.  Urethral Meatus: Normal size. Normal position. No discharge.  Urethra: No tenderness, no mass, no scarring. No hypermobility. No leakage.  Bladder: Normal  to palpation, no tenderness, no mass, normal size.  Vagina: No atrophy, no stenosis. No rectocele. No cystocele. No enterocele.   MULTI-SYSTEM PHYSICAL EXAMINATION:    Constitutional: Well-nourished. No physical deformities. Normally developed. Good grooming.  Respiratory: No labored breathing, no use of accessory muscles.   Cardiovascular: Normal temperature, normal extremity pulses, no swelling, no varicosities.  Skin: No paleness, no jaundice, no cyanosis. No lesion, no ulcer, no rash.  Neurologic / Psychiatric: Oriented to time, oriented to place, oriented to person. No depression, no anxiety, no agitation.  Gastrointestinal: Obese abdomen. No hernia. No mass, no tenderness, no rigidity. No palpable tenderness.  Musculoskeletal: Spine, ribs, pelvis no bilateral tenderness. Normal gait and station of head and neck.   Notes: Pt appears comfortable and no apparent distress.     PAST DATA REVIEWED:  Source Of History:  Patient, Family/Caregiver  Records Review:   Previous Patient Records  Urine Test Review:   Urinalysis, Urine Culture and Sensitivity  X-Ray Review: C.T. Stone Protocol: Reviewed Films.     05/05/16  Urinalysis  Urine Appearance Clear   Urine Color Yellow   Urine Glucose Neg   Urine Bilirubin Neg   Urine Ketones Neg   Urine Specific Gravity 1.010   Urine Blood 2+   Urine pH 7.0   Urine Protein Neg   Urine Urobilinogen 0.2   Urine Nitrites Neg   Urine Leukocyte Esterase Neg   Urine WBC/hpf 0 - 5/hpf   Urine RBC/hpf 0 - 2/hpf   Urine Epithelial Cells 0 - 5/hpf   Urine Bacteria NS (Not Seen)   Urine Mucous Not Present   Urine Yeast NS (Not Seen)   Urine Trichomonas Not Present   Urine Cystals NS (Not Seen)   Urine Casts NS (Not Seen)   Urine Sperm Not Present    PROCEDURES: None   ASSESSMENT:      ICD-10 Details  1 GU:   Calculus Ureter - N20.1    PLAN:            Medications New Meds: Percocet 10 mg-325 mg tablet 1 tablet PO Q 4 H PRN   #20  0  Refill(s)  Promethazine Hcl 25 mg tablet 1 tablet PO Q 6 H PRN   #20  0 Refill(s)            Orders Labs Urine Culture and Sensitivity          Document Letter(s):  Created for Patient: Clinical Summary         Notes:   Noting patient's appearance today, I believe a more concerted effort to control symptoms rather than speed up her procedure is the appropriate way to proceed. Prescription provided for additional nausea medicine, additional analgesia, and sample of Vesicare for bothersome lower urinary tract symptoms provided. Urinalysis will be sent for culture. Patient reminded about taking a probiotic, remaining well hydrated, and taking a stool softener.    * Signed by Jiles Crocker on 05/05/16 at 11:53 AM (EST)*     The information contained in this medical record document is considered private and confidential patient information. This  information can only be used for the medical diagnosis and/or medical services that are being provided by the patient's selected caregivers. This information can only be distributed outside of the patient's care if the patient agrees and signs waivers of authorization for this information to be sent to an outside source or route.  Add: I reviewed patient's office chart, Epic chart, labs and culture.  UA showed no bacteria when seen at [facility] ED on Christmas Day.  Culture grew mixed growth.  UA in office was clear.

## 2016-05-07 ENCOUNTER — Encounter (HOSPITAL_BASED_OUTPATIENT_CLINIC_OR_DEPARTMENT_OTHER): Payer: Self-pay

## 2016-05-07 ENCOUNTER — Ambulatory Visit (HOSPITAL_BASED_OUTPATIENT_CLINIC_OR_DEPARTMENT_OTHER)
Admission: RE | Admit: 2016-05-07 | Discharge: 2016-05-07 | Disposition: A | Discharge status: Home / Self Care | Payer: Medicare Other | Source: Ambulatory Visit | Attending: Urology | Admitting: Urology

## 2016-05-07 ENCOUNTER — Ambulatory Visit (HOSPITAL_BASED_OUTPATIENT_CLINIC_OR_DEPARTMENT_OTHER): Payer: Medicare Other | Admitting: Anesthesiology

## 2016-05-07 ENCOUNTER — Encounter (HOSPITAL_BASED_OUTPATIENT_CLINIC_OR_DEPARTMENT_OTHER)
Admission: RE | Disposition: A | Discharge status: Home / Self Care | Payer: Self-pay | Source: Ambulatory Visit | Attending: Urology

## 2016-05-07 DIAGNOSIS — E119 Type 2 diabetes mellitus without complications: Secondary | ICD-10-CM | POA: Diagnosis not present

## 2016-05-07 DIAGNOSIS — K219 Gastro-esophageal reflux disease without esophagitis: Secondary | ICD-10-CM | POA: Insufficient documentation

## 2016-05-07 DIAGNOSIS — N1330 Unspecified hydronephrosis: Secondary | ICD-10-CM | POA: Insufficient documentation

## 2016-05-07 DIAGNOSIS — Z79890 Hormone replacement therapy (postmenopausal): Secondary | ICD-10-CM | POA: Diagnosis not present

## 2016-05-07 DIAGNOSIS — F329 Major depressive disorder, single episode, unspecified: Secondary | ICD-10-CM | POA: Diagnosis not present

## 2016-05-07 DIAGNOSIS — Z8619 Personal history of other infectious and parasitic diseases: Secondary | ICD-10-CM | POA: Insufficient documentation

## 2016-05-07 DIAGNOSIS — M109 Gout, unspecified: Secondary | ICD-10-CM | POA: Diagnosis not present

## 2016-05-07 DIAGNOSIS — Z79899 Other long term (current) drug therapy: Secondary | ICD-10-CM | POA: Diagnosis not present

## 2016-05-07 DIAGNOSIS — E7800 Pure hypercholesterolemia, unspecified: Secondary | ICD-10-CM | POA: Diagnosis not present

## 2016-05-07 DIAGNOSIS — N210 Calculus in bladder: Secondary | ICD-10-CM | POA: Insufficient documentation

## 2016-05-07 DIAGNOSIS — N201 Calculus of ureter: Secondary | ICD-10-CM | POA: Diagnosis present

## 2016-05-07 HISTORY — PX: CYSTOSCOPY W/ RETROGRADES: SHX1426

## 2016-05-07 HISTORY — DX: Prediabetes: R73.03

## 2016-05-07 SURGERY — CYSTOSCOPY, WITH RETROGRADE PYELOGRAM
Anesthesia: General | Site: Ureter | Laterality: Right

## 2016-05-07 MED ORDER — PROPOFOL 10 MG/ML IV BOLUS
INTRAVENOUS | Status: AC
  Filled 2016-05-07: qty 40

## 2016-05-07 MED ORDER — MIDAZOLAM HCL 2 MG/2ML IJ SOLN
INTRAMUSCULAR | Status: AC
  Filled 2016-05-07: qty 2

## 2016-05-07 MED ORDER — ONDANSETRON HCL 4 MG/2ML IJ SOLN
INTRAMUSCULAR | Status: DC | PRN
  Administered 2016-05-07: 4 mg via INTRAVENOUS

## 2016-05-07 MED ORDER — SCOPOLAMINE 1 MG/3DAYS TD PT72
MEDICATED_PATCH | TRANSDERMAL | Status: DC | PRN
  Administered 2016-05-07: 1 via TRANSDERMAL

## 2016-05-07 MED ORDER — DEXAMETHASONE SODIUM PHOSPHATE 10 MG/ML IJ SOLN
INTRAMUSCULAR | Status: AC
  Filled 2016-05-07: qty 1

## 2016-05-07 MED ORDER — CEFAZOLIN SODIUM-DEXTROSE 2-4 GM/100ML-% IV SOLN
2.0000 g | INTRAVENOUS | Status: AC
  Administered 2016-05-07: 2 g via INTRAVENOUS
  Filled 2016-05-07: qty 100

## 2016-05-07 MED ORDER — FENTANYL CITRATE (PF) 100 MCG/2ML IJ SOLN
25.0000 ug | INTRAMUSCULAR | Status: DC | PRN
  Filled 2016-05-07: qty 1

## 2016-05-07 MED ORDER — CEFAZOLIN SODIUM-DEXTROSE 2-4 GM/100ML-% IV SOLN
INTRAVENOUS | Status: AC
  Filled 2016-05-07: qty 100

## 2016-05-07 MED ORDER — MIDAZOLAM HCL 5 MG/5ML IJ SOLN
INTRAMUSCULAR | Status: DC | PRN
  Administered 2016-05-07: 2 mg via INTRAVENOUS

## 2016-05-07 MED ORDER — IOHEXOL 300 MG/ML  SOLN
INTRAMUSCULAR | Status: DC | PRN
  Administered 2016-05-07: 10 mL

## 2016-05-07 MED ORDER — LIDOCAINE 2% (20 MG/ML) 5 ML SYRINGE
INTRAMUSCULAR | Status: AC
  Filled 2016-05-07: qty 5

## 2016-05-07 MED ORDER — ONDANSETRON HCL 4 MG/2ML IJ SOLN
INTRAMUSCULAR | Status: AC
  Filled 2016-05-07: qty 2

## 2016-05-07 MED ORDER — KETOROLAC TROMETHAMINE 30 MG/ML IJ SOLN
30.0000 mg | Freq: Once | INTRAMUSCULAR | Status: DC | PRN
  Filled 2016-05-07: qty 1

## 2016-05-07 MED ORDER — CEFAZOLIN IN D5W 1 GM/50ML IV SOLN
1.0000 g | INTRAVENOUS | Status: DC
  Filled 2016-05-07: qty 50

## 2016-05-07 MED ORDER — FENTANYL CITRATE (PF) 100 MCG/2ML IJ SOLN
INTRAMUSCULAR | Status: AC
  Filled 2016-05-07: qty 2

## 2016-05-07 MED ORDER — PROMETHAZINE HCL 25 MG/ML IJ SOLN
6.2500 mg | INTRAMUSCULAR | Status: DC | PRN
  Filled 2016-05-07: qty 1

## 2016-05-07 MED ORDER — SCOPOLAMINE 1 MG/3DAYS TD PT72
MEDICATED_PATCH | TRANSDERMAL | Status: AC
  Filled 2016-05-07: qty 1

## 2016-05-07 MED ORDER — KETOROLAC TROMETHAMINE 30 MG/ML IJ SOLN
INTRAMUSCULAR | Status: AC
  Filled 2016-05-07: qty 1

## 2016-05-07 MED ORDER — DEXAMETHASONE SODIUM PHOSPHATE 4 MG/ML IJ SOLN
INTRAMUSCULAR | Status: DC | PRN
  Administered 2016-05-07: 10 mg via INTRAVENOUS

## 2016-05-07 MED ORDER — STERILE WATER FOR IRRIGATION IR SOLN
Status: DC | PRN
  Administered 2016-05-07: 500 mL

## 2016-05-07 MED ORDER — LACTATED RINGERS IV SOLN
INTRAVENOUS | Status: DC
  Administered 2016-05-07 (×2): via INTRAVENOUS
  Filled 2016-05-07: qty 1000

## 2016-05-07 MED ORDER — FUROSEMIDE 10 MG/ML IJ SOLN
INTRAMUSCULAR | Status: DC | PRN
  Administered 2016-05-07: 10 mg via INTRAMUSCULAR

## 2016-05-07 MED ORDER — SODIUM CHLORIDE 0.9 % IR SOLN
Status: DC | PRN
Start: 2016-05-07 — End: 2016-05-07
  Administered 2016-05-07: 4000 mL

## 2016-05-07 MED ORDER — LIDOCAINE 2% (20 MG/ML) 5 ML SYRINGE
INTRAMUSCULAR | Status: DC | PRN
  Administered 2016-05-07: 60 mg via INTRAVENOUS

## 2016-05-07 MED ORDER — FUROSEMIDE 10 MG/ML IJ SOLN
INTRAMUSCULAR | Status: AC
  Filled 2016-05-07: qty 2

## 2016-05-07 MED ORDER — PROPOFOL 10 MG/ML IV BOLUS
INTRAVENOUS | Status: DC | PRN
  Administered 2016-05-07: 180 mg via INTRAVENOUS

## 2016-05-07 MED ORDER — FENTANYL CITRATE (PF) 100 MCG/2ML IJ SOLN
INTRAMUSCULAR | Status: DC | PRN
  Administered 2016-05-07: 50 ug via INTRAVENOUS

## 2016-05-07 SURGICAL SUPPLY — 30 items
BAG DRAIN URO-CYSTO SKYTR STRL (DRAIN) ×3 IMPLANT
BASKET LASER NITINOL 1.9FR (BASKET) IMPLANT
BASKET STNLS GEMINI 4WIRE 3FR (BASKET) IMPLANT
BASKET ZERO TIP NITINOL 2.4FR (BASKET) IMPLANT
CATH INTERMIT  6FR 70CM (CATHETERS) ×3 IMPLANT
CATH URET 5FR 28IN CONE TIP (BALLOONS)
CATH URET 5FR 28IN OPEN ENDED (CATHETERS) IMPLANT
CATH URET 5FR 70CM CONE TIP (BALLOONS) IMPLANT
CATH URET DUAL LUMEN 6-10FR 50 (CATHETERS) IMPLANT
CLOTH BEACON ORANGE TIMEOUT ST (SAFETY) ×3 IMPLANT
GLOVE BIO SURGEON STRL SZ 6.5 (GLOVE) ×3 IMPLANT
GLOVE BIO SURGEON STRL SZ7.5 (GLOVE) ×3 IMPLANT
GLOVE INDICATOR 7.0 STRL GRN (GLOVE) ×3 IMPLANT
GOWN STRL REUS W/ TWL LRG LVL3 (GOWN DISPOSABLE) ×2 IMPLANT
GOWN STRL REUS W/ TWL XL LVL3 (GOWN DISPOSABLE) IMPLANT
GOWN STRL REUS W/TWL LRG LVL3 (GOWN DISPOSABLE) ×1
GOWN STRL REUS W/TWL XL LVL3 (GOWN DISPOSABLE) ×3 IMPLANT
GUIDEWIRE 0.038 PTFE COATED (WIRE) IMPLANT
GUIDEWIRE ANG ZIPWIRE 038X150 (WIRE) IMPLANT
GUIDEWIRE STR DUAL SENSOR (WIRE) ×3 IMPLANT
IV NS 1000ML (IV SOLUTION) ×1
IV NS 1000ML BAXH (IV SOLUTION) ×2 IMPLANT
IV NS IRRIG 3000ML ARTHROMATIC (IV SOLUTION) ×3 IMPLANT
KIT ROOM TURNOVER WOR (KITS) ×3 IMPLANT
MANIFOLD NEPTUNE II (INSTRUMENTS) ×3 IMPLANT
PACK CYSTO (CUSTOM PROCEDURE TRAY) ×3 IMPLANT
SHEATH ACCESS URETERAL 38CM (SHEATH) IMPLANT
SYRINGE IRR TOOMEY STRL 70CC (SYRINGE) IMPLANT
TUBE CONNECTING 12X1/4 (SUCTIONS) ×3 IMPLANT
WATER STERILE IRR 500ML POUR (IV SOLUTION) ×3 IMPLANT

## 2016-05-07 NOTE — Interval H&P Note (Signed)
History and Physical Interval Note:  05/07/2016 8:42 AM  [name]  has presented today for surgery, with the diagnosis of right ureteral stone  The various methods of treatment have been discussed with the patient and family. After consideration of risks, benefits and other options for treatment, the patient has consented to  Procedure(s): RIGHT URETEROSCOPY/HOLMIUM LASER/STENT PLACEMENT (Right) as a surgical intervention .  The patient's history has been reviewed, patient examined, no change in status, stable for surgery. She's had no fever over the past 4 days. She feels well. Discussed possible need for staged procedure, risk of infection/fever among others. Urine cx grew multiple species. I have reviewed the patient's chart and labs.  Questions were answered to the patient's satisfaction. She elects to proceed.      Sabrina Beasley

## 2016-05-07 NOTE — Op Note (Signed)
Preoperative diagnosis: Right ureteral stone Postoperative diagnosis: Bladder stone  Procedure: Cystoscopy, right retrograde pyelogram  Surgeon: Junious Silk  Anesthesia: Gen.  Indication for procedure: 55 year old with symptomatic right distal ureteral stone. She was brought for the above procedures cystoscopy with right ureteroscopy.  Findings: On exam the vulva and the meatus appeared normal. On cystoscopy the urethra appeared normal. The trigone and ureteral orifices were in their normal orthotopic position. The right ureteral orifice and trigone was edematous with some erythema and blood at the ureteral orifice. The stone was located in the bladder. It appears to have just passed. Clear efflux from the left ureteral orifice. The bladder mucosa appeared normal. There were no foreign bodies in the bladder.  Right retrograde pyelogram-this outlined a single ureter single collecting system unit without filling defect, there is no ureteral dilation, there may been some mild dilation of the collecting system but much improved compared to the CT. Brisk washout from the right side.  Description of procedure: After consent was obtained patient brought to the operating room. After adequate anesthesia she was placed in lithotomy position and prepped and draped in the usual sterile fashion. A timeout was performed to confirm the patient and procedure. The cystoscope was passed per urethra where the stone was located. It was drained from the bladder and collected. Initially there was no efflux of urine from either  ureteral orifice. Therefore she was given some more fluid and 10 mg of IV Lasix. There was clear efflux from the left side. The right side still lagged there was blood at the ureteral orifice from the recent stone passage therefore the right ureteral orifice was cannulated with a 6 [place] open-ended catheter and right retrograde injection of contrast was performed. There was brisk washout. I did review  the CT and there were no other stones on either side. The bladder was drained and the scope removed. She was awakened and taken to the recovery room in stable condition.   Complications: None   Blood loss: Minimal   Specimens: Stone given to patient   Drains: None   Disposition: Patient stable to PACU

## 2016-05-07 NOTE — Anesthesia Postprocedure Evaluation (Signed)
Anesthesia Post Note  Patient: Sabrina Beasley  Procedure(s) Performed: Procedure(s) (LRB): CYSTOSCOPY WITH RETROGRADE PYELOGRAM (Right)  Patient location during evaluation: PACU Anesthesia Type: General Level of consciousness: awake and alert Pain management: pain level controlled Vital Signs Assessment: post-procedure vital signs reviewed and stable Respiratory status: spontaneous breathing, nonlabored ventilation, respiratory function stable and patient connected to nasal cannula oxygen Cardiovascular status: blood pressure returned to baseline and stable Postop Assessment: no signs of nausea or vomiting Anesthetic complications: no       Last Vitals:  Vitals:   05/07/16 0656 05/07/16 0958  BP: 125/72 130/75  Pulse: 82 90  Resp: 16 12  Temp: 36.9 C 36.9 C    Last Pain:  Vitals:   05/07/16 0656  TempSrc: Oral  PainSc:                  [name] S

## 2016-05-07 NOTE — Interval H&P Note (Signed)
History and Physical Interval Note:  05/07/2016 8:59 AM  I should add she continues to have some RLQ pain and has not seen the stone pass.    Lincon Sahlin

## 2016-05-07 NOTE — Anesthesia Procedure Notes (Signed)
Procedure Name: LMA Insertion Date/Time: 05/07/2016 9:19 AM Performed by: Wanita Chamberlain Pre-anesthesia Checklist: Patient identified, Timeout performed, Emergency Drugs available, Suction available and Patient being monitored Patient Re-evaluated:Patient Re-evaluated prior to inductionOxygen Delivery Method: Circle system utilized Preoxygenation: Pre-oxygenation with 100% oxygen Intubation Type: IV induction Ventilation: Mask ventilation without difficulty LMA: LMA inserted LMA Size: 4.0 Number of attempts: 1 Placement Confirmation: positive ETCO2 and breath sounds checked- equal and bilateral Tube secured with: Tape Dental Injury: Teeth and Oropharynx as per pre-operative assessment

## 2016-05-07 NOTE — Discharge Instructions (Signed)
Cystoscopy, Care After Refer to this sheet in the next few weeks. These instructions provide you with information about caring for yourself after your procedure. Your health care provider may also give you more specific instructions. Your treatment has been planned according to current medical practices, but problems sometimes occur. Call your health care provider if you have any problems or questions after your procedure. What can I expect after the procedure? After the procedure, it is common to have:  Mild pain when you urinate. Pain should stop within a few minutes after you urinate. This may last for up to 1 week.  A small amount of blood in your urine for several days.  Feeling like you need to urinate but producing only a small amount of urine. Follow these instructions at home:   Medicines  Take over-the-counter and prescription medicines only as told by your health care provider.  If you were prescribed an antibiotic medicine, take it as told by your health care provider. Do not stop taking the antibiotic even if you start to feel better. General instructions  Return to your normal activities as told by your health care provider. Ask your health care provider what activities are safe for you.  Do not drive for 24 hours if you received a sedative.  Watch for any blood in your urine. If the amount of blood in your urine increases, call your health care provider.  Follow instructions from your health care provider about eating or drinking restrictions.  If a tissue sample was removed for testing (biopsy) during your procedure, it is your responsibility to get your test results. Ask your health care provider or the department performing the test when your results will be ready.  Drink enough fluid to keep your urine clear or pale yellow.  Keep all follow-up visits as told by your health care provider. This is important. Contact a health care provider if:  You have pain that  gets worse or does not get better with medicine, especially pain when you urinate.  You have difficulty urinating. Get help right away if:  You have more blood in your urine.  You have blood clots in your urine.  You have abdominal pain.  You have a fever or chills.  You are unable to urinate. This information is not intended to replace advice given to you by your health care provider. Make sure you discuss any questions you have with your health care provider. Document Released: 11/13/2004 Document Revised: 10/02/2015 Document Reviewed: 03/13/2015 Elsevier Interactive Patient Education  2017 [place] [facility] Instructions  Activity: Get plenty of rest for the remainder of the day. A responsible adult should stay with you for 24 hours following the procedure.  For the next 24 hours, DO NOT: -Drive a car -Paediatric nurse -Drink alcoholic beverages -Take any medication unless instructed by your physician -Make any legal decisions or sign important papers.  Meals: Start with liquid foods such as gelatin or soup. Progress to regular foods as tolerated. Avoid greasy, spicy, heavy foods. If nausea and/or vomiting occur, drink only clear liquids until the nausea and/or vomiting subsides. Call your physician if vomiting continues.  Special Instructions/Symptoms: Your throat may feel dry or sore from the anesthesia or the breathing tube placed in your throat during surgery. If this causes discomfort, gargle with warm salt water. The discomfort should disappear within 24 hours.  If you had a scopolamine patch placed behind your ear for the management of  of post- operative nausea and/or vomiting: ° °1. The medication in the patch is effective for 72 hours, after which it should be removed.  Wrap patch in a tissue and discard in the trash. Wash hands thoroughly with soap and water. °2. You may remove the patch earlier than 72 hours if you experience unpleasant  side effects which may include dry mouth, dizziness or visual disturbances. °3. Avoid touching the patch. Wash your hands with soap and water after contact with the patch. °  ° °

## 2016-05-07 NOTE — Transfer of Care (Signed)
Immediate Anesthesia Transfer of Care Note  Patient: Sabrina Beasley  Procedure(s) Performed: Procedure(s): CYSTOSCOPY WITH RETROGRADE PYELOGRAM (Right)  Patient Location: PACU  Anesthesia Type:General  Level of Consciousness: awake, alert , oriented and patient cooperative  Airway & Oxygen Therapy: Patient Spontanous Breathing and Patient connected to nasal cannula oxygen  Post-op Assessment: Report given to RN and Post -op Vital signs reviewed and stable  Post vital signs: Reviewed and stable  Last Vitals:  Vitals:   05/07/16 0656  BP: 125/72  Pulse: 82  Resp: 16  Temp: 36.9 C    Last Pain:  Vitals:   05/07/16 0656  TempSrc: Oral  PainSc:       Patients Stated Pain Goal: 7 ([id number])  Complications: No apparent anesthesia complications

## 2016-05-07 NOTE — Anesthesia Preprocedure Evaluation (Addendum)
Anesthesia Evaluation  Patient identified by MRN, date of birth, ID band Patient awake    Reviewed: Allergy & Precautions, NPO status , Patient's Chart, lab work & pertinent test results  History of Anesthesia Complications (+) PONV  Airway Mallampati: II  TM Distance: >3 FB Neck ROM: Full    Dental no notable dental hx.    Pulmonary neg pulmonary ROS, Current Smoker,    Pulmonary exam normal breath sounds clear to auscultation       Cardiovascular Exercise Tolerance: Good negative cardio ROS Normal cardiovascular exam Rhythm:Regular Rate:Normal     Neuro/Psych Anxiety Depression DDD cervical spine negative neurological ROS  negative psych ROS   GI/Hepatic GERD  ,(+) Hepatitis -, C  Endo/Other  negative endocrine ROSdiabetes, Well Controlled, Type 2, Oral Hypoglycemic Agents  Renal/GU negative Renal ROS  negative genitourinary   Musculoskeletal negative musculoskeletal ROS (+)   Abdominal   Peds negative pediatric ROS (+)  Hematology negative hematology ROS (+)   Anesthesia Other Findings   Reproductive/Obstetrics negative OB ROS                            Anesthesia Physical Anesthesia Plan  ASA: III  Anesthesia Plan: General   Post-op Pain Management:    Induction: Intravenous  Airway Management Planned: LMA  Additional Equipment:   Intra-op Plan:   Post-operative Plan: Extubation in OR  Informed Consent: I have reviewed the patients History and Physical, chart, labs and discussed the procedure including the risks, benefits and alternatives for the proposed anesthesia with the patient or authorized representative who has indicated his/her understanding and acceptance.   Dental advisory given  Plan Discussed with: CRNA and Surgeon  Anesthesia Plan Comments:         Anesthesia Quick Evaluation

## 2016-05-07 NOTE — Anesthesia Preprocedure Evaluation (Addendum)
Anesthesia Evaluation  Patient identified by MRN, date of birth, ID band Patient awake    Reviewed: Allergy & Precautions, NPO status , Patient's Chart, lab work & pertinent test results  History of Anesthesia Complications (+) PONV  Airway Mallampati: II  TM Distance: >3 FB Neck ROM: Limited  Mouth opening: Limited Mouth Opening  Dental no notable dental hx. (+) Teeth Intact, Dental Advisory Given   Pulmonary Current Smoker,    Pulmonary exam normal breath sounds clear to auscultation       Cardiovascular Exercise Tolerance: Good negative cardio ROS Normal cardiovascular exam Rhythm:Regular Rate:Normal     Neuro/Psych negative neurological ROS  negative psych ROS   GI/Hepatic GERD  Medicated and Controlled,(+) Hepatitis -  Endo/Other  diabetes  Renal/GU negative Renal ROS  negative genitourinary   Musculoskeletal negative musculoskeletal ROS (+)   Abdominal   Peds negative pediatric ROS (+)  Hematology negative hematology ROS (+)   Anesthesia Other Findings   Reproductive/Obstetrics negative OB ROS                           Anesthesia Physical Anesthesia Plan  ASA: III  Anesthesia Plan: General   Post-op Pain Management:    Induction: Intravenous  Airway Management Planned: LMA  Additional Equipment:   Intra-op Plan:   Post-operative Plan: Extubation in OR  Informed Consent: I have reviewed the patients History and Physical, chart, labs and discussed the procedure including the risks, benefits and alternatives for the proposed anesthesia with the patient or authorized representative who has indicated his/her understanding and acceptance.   Dental advisory given  Plan Discussed with: CRNA and Surgeon  Anesthesia Plan Comments:         Anesthesia Quick Evaluation

## 2016-05-11 ENCOUNTER — Encounter (HOSPITAL_BASED_OUTPATIENT_CLINIC_OR_DEPARTMENT_OTHER): Payer: Self-pay | Admitting: Urology

## 2016-05-25 ENCOUNTER — Other Ambulatory Visit (HOSPITAL_COMMUNITY): Payer: Self-pay | Admitting: Physician Assistant

## 2016-05-25 DIAGNOSIS — Z1231 Encounter for screening mammogram for malignant neoplasm of breast: Secondary | ICD-10-CM

## 2016-06-08 ENCOUNTER — Encounter: Payer: Self-pay | Admitting: Internal Medicine

## 2016-06-24 ENCOUNTER — Ambulatory Visit (HOSPITAL_COMMUNITY)
Admission: RE | Admit: 2016-06-24 | Discharge: 2016-06-24 | Disposition: A | Discharge status: Home / Self Care | Payer: Medicare Other | Source: Ambulatory Visit | Attending: Physician Assistant | Admitting: Physician Assistant

## 2016-06-24 DIAGNOSIS — Z1231 Encounter for screening mammogram for malignant neoplasm of breast: Secondary | ICD-10-CM | POA: Insufficient documentation

## 2016-07-01 ENCOUNTER — Ambulatory Visit (INDEPENDENT_AMBULATORY_CARE_PROVIDER_SITE_OTHER): Payer: Medicare Other | Admitting: Gastroenterology

## 2016-07-01 ENCOUNTER — Encounter: Payer: Self-pay | Admitting: Gastroenterology

## 2016-07-01 DIAGNOSIS — Z8619 Personal history of other infectious and parasitic diseases: Secondary | ICD-10-CM | POA: Diagnosis not present

## 2016-07-01 NOTE — Patient Instructions (Signed)
1. Please have your labs and ultrasound done.  2. I will obtain your records as discussed.   Hepatitis C Hepatitis C is a viral infection of the liver. It can lead to scarring of the liver (cirrhosis), liver failure, or liver cancer. Hepatitis C may go undetected for months or years because people with the infection may not have symptoms, or they may have only mild symptoms. What are the causes? Hepatitis C is caused by the hepatitis C virus (HCV). The virus can be passed from one person to another through:  Blood.  Contaminated needles, such as those used for tattooing, body piercing, acupuncture, or injecting drugs.  Having unprotected sex with an infected person.  Childbirth.  Blood transfusions or organ transplants done in the [place] before 1992. What increases the risk? Risk factors for hepatitis C include:  Having unprotected sex with an infected person.  Using illegal drugs. What are the signs or symptoms? Symptoms of hepatitis C may include:  Fatigue.  Loss of appetite.  Nausea.  Vomiting.  Abdominal pain.  Dark yellow urine.  Yellowish skin and eyes (jaundice).  Itching of the skin.  Clay-colored bowel movements.  Joint pain. Symptoms are not always present. How is this diagnosed? Hepatitis C is diagnosed with blood tests. Other types of tests may also be done to check how your liver is functioning. How is this treated? Your health care provider may perform noninvasive tests or a liver biopsy to help determine the best course of treatment. Treatment for hepatitis C may include one or more medicines. Your health care provider may check you for a recurring infection or other liver conditions every 6-12 months after treatment. Follow these instructions at home:  Rest as needed.  Take all medicines as directed by your health care provider.  Do not take any medicine unless approved by your health care provider. This includes over-the-counter medicine  and birth control pills.  Do not drink alcohol.  Do not have sex until approved by your health care provider.  Do not share toothbrushes, nail clippers, razors, or needles. How is this prevented? There is no vaccine for hepatitis C. The only way to prevent the disease is to reduce the risk of exposure to the virus. This may be done by:  Practicing safe sex and using condoms.  Avoiding illegal drugs. Contact a health care provider if:  You have a fever.  You develop abdominal pain.  You develop dark urine.  You have clay-colored bowel movements.  You develop joint pains. Get help right away if:  You have increasing fatigue or weakness.  You lose your appetite.  You feel nauseous or vomit.  You develop jaundice or your jaundice gets worse.  You bruise or bleed easily. This information is not intended to replace advice given to you by your health care provider. Make sure you discuss any questions you have with your health care provider. Document Released: 04/23/2000 Document Revised: 10/02/2015 Document Reviewed: 08/08/2013 Elsevier Interactive Patient Education  2017 [employer].

## 2016-07-01 NOTE — Progress Notes (Addendum)
Primary Care Physician:  Vesta Mixer  Primary Gastroenterologist:  Garfield Cornea, MD   Chief Complaint  Patient presents with  . Hepatitis C    HPI:  Sabrina Beasley is a 56 y.o. female here For follow-up of hepatitis C. She was seen back in 2008 by Dr. Gala Romney and arrange for colonoscopy but she did not have that done at that time. We have not seen her since. She states that she developed acute jaundice in 2010 and was told she had hepatitis C. She states that initially she was seen Dr. Windy Fast and he referred her to [facility] for treatment. She states she had a liver biopsy, no treatment offered there but they referred her back today and able and at some point she started with an with Dr. Earley Brooke. Patient does not fully understand why she was never offered any treatment. Recently requested her PCP to follow-up on her hepatitis C.  Generally patient has felt well. No complaints. She has mild constipation which she manages nicely with MiraLAX. Denies abdominal pain, melena, rectal bleeding, heartburn, dysphagia, vomiting, unintentional weight loss, pruritus, edema. No episodes of confusion. She states that previous EGD and colonoscopy were performed by Dr. Earley Brooke. He recalls having several colon polyps and believe she was post come back in 3-5 years and she may be passed to.   Patient brought in some progress notes dating back to 2000 02/10/2012, PCP at that time. Handwritten notes somewhat difficult to read. She was seen for jaundice. October 2010 her total bilirubin was 9.9, direct bilirubin 6.9, alkaline phosphatase 218, AST 1720, ALT 1898. Hepatitis C antibody greater than 11, hepatitis B core IgM negative, hepatitis B surface antigen negative, hepatitis A IgM negative. HIV negative.  After patient left we were able to obtain the following records.   Records from Dr. Windy Fast dated 02/26/2009 and 02/28/2009, suspected drug-induced hepatitis from simvastatin and fenofibrate rather than acute  hepatitis C.  Records from Dr. Posey Pronto: EGD January 2014 mild chronic gastritis and esophagitis no H pylori. Colonoscopy September 2013 by Dr. Posey Pronto, tortuous colon, colon polyps, multiple 4-6 mm polyps two ascending colon (cauterized), one transverse, 6 sigmoid. Transverse polyps adenomatous, sigmoid hyperplastic.  2010 records from [facility] via care everywhere reviewed. Per their note she had HCV RNA present in her serum but at a level below the lower limit of detection (less than 43), genotype could not be obtained due to this factor.. Weakly Positive ANA, negative smooth muscle antibody, serum copper 1.44, ceruloplasmin 35. Liver biopsy performed with mild portal and lobular hepatitis, mild periportal fibrosis, nonspecific findings but could include acute viral infection, medication/toxic related injury, less likely autoimmune hepatitis and Wilson's disease.  Current Outpatient Prescriptions  Medication Sig Dispense Refill  . allopurinol (ZYLOPRIM) 300 MG tablet Take 300 mg by mouth daily.    Marland Kitchen atenolol (TENORMIN) 25 MG tablet Take 25 mg by mouth daily.    . cetirizine (ZYRTEC) 10 MG tablet Take 10 mg by mouth daily.  2  . citalopram (CELEXA) 40 MG tablet Take 40 mg by mouth daily.      . fenofibrate (TRICOR) 145 MG tablet Take 145 mg by mouth daily.    . naproxen (NAPROSYN) 500 MG tablet Take 500 mg by mouth 2 (two) times daily with a meal.    . pantoprazole (PROTONIX) 40 MG tablet Take 1 tablet by mouth daily.    . simvastatin (ZOCOR) 40 MG tablet Take 40 mg by mouth at bedtime.      Marland Kitchen  traZODone (DESYREL) 150 MG tablet Take 150 mg by mouth at bedtime.       No current facility-administered medications for this visit.     Allergies as of 07/01/2016 - Review Complete 07/01/2016  Allergen Reaction Noted  . Nitrofurantoin monohyd macro Nausea And Vomiting 11/03/2010  . Sulfa antibiotics Rash 06/12/2015    Past Medical History:  Diagnosis Date  . Anxiety   . Bulging of cervical  intervertebral disc    C4-5 &  C6-7  . Constipation   . DDD (degenerative disc disease), cervical    stenosis  . Depression   . Family history of premature CAD   . GERD (gastroesophageal reflux disease)   . Gout   . History of endometriosis   . History of kidney stones     right side w associated n/v  . History of ovarian cyst   . History of rheumatic fever   . Hx of hepatitis C dx 04-16-2009 liver bx   mild portal/ periportal fibrosis-- lower serum level detection positive antibody virus RNA  . Hyperlipidemia   . OA (osteoarthritis)   . Osteoporosis   . PONV (postoperative nausea and vomiting)    pt states scopalomine patches help  . Pre-diabetes   . Right ureteral stone     Past Surgical History:  Procedure Laterality Date  . CARDIOVASCULAR STRESS TEST  06/19/2015  dr Bronson Ing   normal nuclear study, low risk w/ overall LVSF normal and wall motion normal , stress ef 57%  . CARPAL TUNNEL RELEASE Right x2  last one  . CARPAL TUNNEL RELEASE Left 02/23/2013   Procedure: CARPAL TUNNEL RELEASE;  Surgeon: Carole Civil, MD;  Location: [facility];  Service: Orthopedics;  Laterality: Left;  . CHOLECYSTECTOMY  1999  . COLONOSCOPY  01/2012   Dr. Posey Pronto: tortous colon, multiple colon polyps, 2 cauterized in ascending colon, one transverse colon adenomatous, sigmoid polyps (6) hyperplastic.   Marland Kitchen CYSTOSCOPY W/ RETROGRADES Right 05/07/2016   Procedure: CYSTOSCOPY WITH RETROGRADE PYELOGRAM;  Surgeon: Festus Aloe, MD;  Location: [facility];  Service: Urology;  Laterality: Right;  . DX LAPAROSCOPY/  D & C HYSTEROSCOPY  2001  . ESOPHAGOGASTRODUODENOSCOPY  05/2012   Dr. Posey Pronto: esophagitis, gastritis, no hpylori  . LAPAROSCOPIC SALPINGO OOPHERECTOMY Right 08/24/2006  . RECTOCELE REPAIR N/A 01/13/2016   Procedure: POSTERIOR VAGINAL REPAIR (RECTOCELE);  Surgeon: Jonnie Kind, MD;  Location: [facility];  Service: Gynecology;  Laterality: N/A;  . TOTAL ABDOMINAL HYSTERECTOMY  Left 05/22/2002   w/  Left Salpingo Oopherectomy  . TRANSTHORACIC ECHOCARDIOGRAM  06/19/2015   ef 60-65%/  trivial MR and PR  . TUBAL LIGATION  1989    Family History  Problem Relation Age of Onset  . Hypertension Mother   . Hyperlipidemia Mother   . Heart disease Father   . Heart disease Brother   . Colon polyps Brother   . Colon cancer Neg Hx     Social History   Social History  . Marital status: Married    Spouse name: N/A  . Number of children: N/A  . Years of education: N/A   Occupational History  . Not on file.   Social History Main Topics  . Smoking status: Current Every Day Smoker    Packs/day: 0.50    Years: 10.00    Types: Cigarettes    Start date: 05/10/1978  . Smokeless tobacco: Never Used  . Alcohol use No  . Drug use: No  . Sexual activity: Not  on file   Other Topics Concern  . Not on file   Social History Narrative  . No narrative on file      ROS:  General: Negative for anorexia, weight loss, fever, chills, fatigue, weakness. Eyes: Negative for vision changes.  ENT: Negative for hoarseness, difficulty swallowing , nasal congestion. CV: Negative for chest pain, angina, palpitations, dyspnea on exertion, peripheral edema.  Respiratory: Negative for dyspnea at rest, dyspnea on exertion, cough, sputum, wheezing.  GI: See history of present illness. GU:  Negative for dysuria, hematuria, urinary incontinence, urinary frequency, nocturnal urination.  MS: Negative for joint pain, low back pain.  Derm: Negative for rash or itching.  Neuro: Negative for weakness, abnormal sensation, seizure, frequent headaches, memory loss, confusion.  Psych: Negative for anxiety, depression, suicidal ideation, hallucinations.  Endo: Negative for unusual weight change.  Heme: Negative for bruising or bleeding. Allergy: Negative for rash or hives.    Physical Examination:  BP 123/75   Pulse 81   Temp 98.1 F (36.7 C) (Oral)   Ht 4\' 11"  (1.499 m)   Wt 153 lb  6.4 oz (69.6 kg)   BMI 30.98 kg/m    General: Well-nourished, well-developed in no acute distress.  Head: Normocephalic, atraumatic.   Eyes: Conjunctiva pink, no icterus. Mouth: Oropharyngeal mucosa moist and pink , no lesions erythema or exudate. Neck: Supple without thyromegaly, masses, or lymphadenopathy.  Lungs: Clear to auscultation bilaterally.  Heart: Regular rate and rhythm, no murmurs rubs or gallops.  Abdomen: Bowel sounds are normal, nontender, nondistended, no hepatosplenomegaly or masses, no abdominal bruits or    hernia , no rebound or guarding.   Rectal: Not performed Extremities: No lower extremity edema. No clubbing or deformities.  Neuro: Alert and oriented x 4 , grossly normal neurologically.  Skin: Warm and dry, no rash or jaundice.   Psych: Alert and cooperative, normal mood and affect.  Labs: As above, no recent labs available however.  Imaging Studies: Mm Screening Breast Tomo Bilateral  Result Date: 06/24/2016 CLINICAL DATA:  Screening. EXAM: 2D DIGITAL SCREENING BILATERAL MAMMOGRAM WITH CAD AND ADJUNCT TOMO COMPARISON:  Previous exam(s). ACR Breast Density Category b: There are scattered areas of fibroglandular density. FINDINGS: There are no findings suspicious for malignancy. Images were processed with CAD. IMPRESSION: No mammographic evidence of malignancy. A result letter of this screening mammogram will be mailed directly to the patient. RECOMMENDATION: Screening mammogram in one year. (Code:SM-B-01Y) BI-RADS CATEGORY  1: Negative. Electronically Signed   By: Franki Cabot M.D.   On: 06/24/2016 16:36

## 2016-07-02 ENCOUNTER — Encounter: Payer: Self-pay | Admitting: Gastroenterology

## 2016-07-02 LAB — CBC WITH DIFFERENTIAL/PLATELET
Basophils Absolute: 0 10*3/uL (ref 0.0–0.2)
Basos: 0 %
EOS (ABSOLUTE): 0.3 10*3/uL (ref 0.0–0.4)
Eos: 3 %
Hematocrit: 37.3 % (ref 34.0–46.6)
Hemoglobin: 12.6 g/dL (ref 11.1–15.9)
Immature Grans (Abs): 0 10*3/uL (ref 0.0–0.1)
Immature Granulocytes: 0 %
Lymphocytes Absolute: 2.4 10*3/uL (ref 0.7–3.1)
Lymphs: 23 %
MCH: 30.2 pg (ref 26.6–33.0)
MCHC: 33.8 g/dL (ref 31.5–35.7)
MCV: 89 fL (ref 79–97)
Monocytes Absolute: 0.4 10*3/uL (ref 0.1–0.9)
Monocytes: 4 %
Neutrophils Absolute: 7.1 10*3/uL — ABNORMAL HIGH (ref 1.4–7.0)
Neutrophils: 70 %
Platelets: 488 10*3/uL — ABNORMAL HIGH (ref 150–379)
RBC: 4.17 x10E6/uL (ref 3.77–5.28)
RDW: 14.1 % (ref 12.3–15.4)
WBC: 10.2 10*3/uL (ref 3.4–10.8)

## 2016-07-02 LAB — COMPREHENSIVE METABOLIC PANEL
ALT: 17 IU/L (ref 0–32)
AST: 21 IU/L (ref 0–40)
Albumin/Globulin Ratio: 1.8 (ref 1.2–2.2)
Albumin: 4.6 g/dL (ref 3.5–5.5)
Alkaline Phosphatase: 63 IU/L (ref 39–117)
BUN/Creatinine Ratio: 18 (ref 9–23)
BUN: 14 mg/dL (ref 6–24)
Bilirubin Total: 0.3 mg/dL (ref 0.0–1.2)
CO2: 24 mmol/L (ref 18–29)
Calcium: 10.2 mg/dL (ref 8.7–10.2)
Chloride: 102 mmol/L (ref 96–106)
Creatinine, Ser: 0.78 mg/dL (ref 0.57–1.00)
GFR calc Af Amer: 100 mL/min/{1.73_m2} (ref >59)
GFR calc non Af Amer: 87 mL/min/{1.73_m2} (ref >59)
Globulin, Total: 2.5 g/dL (ref 1.5–4.5)
Glucose: 118 mg/dL — ABNORMAL HIGH (ref 65–99)
Potassium: 4.4 mmol/L (ref 3.5–5.2)
Sodium: 144 mmol/L (ref 134–144)
Total Protein: 7.1 g/dL (ref 6.0–8.5)

## 2016-07-02 LAB — PROTIME-INR
INR: 1 (ref 0.8–1.2)
Prothrombin Time: 10.4 s (ref 9.1–12.0)

## 2016-07-02 LAB — HEPATITIS B SURFACE ANTIBODY,QUALITATIVE: Hep B Surface Ab, Qual: REACTIVE

## 2016-07-02 LAB — HCV RNA QUANT RFLX ULTRA OR GENOTYP: HCV Quant Baseline: NOT DETECTED IU/mL

## 2016-07-02 LAB — HEPATITIS B CORE ANTIBODY, TOTAL: Hep B Core Total Ab: NEGATIVE

## 2016-07-02 LAB — HIV ANTIBODY (ROUTINE TESTING W REFLEX): HIV Screen 4th Generation wRfx: NONREACTIVE

## 2016-07-02 NOTE — Assessment & Plan Note (Signed)
56 year old female presenting today for follow-up of hepatitis C. She developed acute onset jaundice back in 2010. At that time however markers indicate positive hepatitis C antibody. Initially seen by Dr. Docia Furl he was concern for drug reaction to simvastatin and fenofibrate rather than acute hepatitis C. Medications were withheld and clinically she improved rapidly. She was sent to [facility] for further evaluation of hepatitis C. She was determined to have HCV and RNA positive but below the lower limit of detection which was less than 43. Genotype could not be performed. Liver biopsy was inconclusive as far as the etiology of her acute jaundice. Patient states she did not follow-up at [facility] but established care eventually with Dr. Posey Pronto. She is really unclear on any further HCV RNA testing. She states no one ever offered her any treatment. She is very much interested in pursuing further evaluation of her hepatitis C.  She is also due for surveillance colonoscopy for history of adenomatous colon polyps.  We'll initiate further labs. Abdominal ultrasound with elastography if indicated.

## 2016-07-05 ENCOUNTER — Telehealth: Payer: Self-pay | Admitting: General Practice

## 2016-07-05 NOTE — Telephone Encounter (Signed)
Patient called and wanted the results from her labs she had drawn last week.  I explained to the patient that our policy is 0000000 days on results, however they will be available in My Cart in 4 days.  Routing to [employer]

## 2016-07-06 ENCOUNTER — Telehealth: Payer: Self-pay | Admitting: Internal Medicine

## 2016-07-06 NOTE — Telephone Encounter (Signed)
See result note.  

## 2016-07-06 NOTE — Telephone Encounter (Signed)
Pt said she received her results on mychart and doesn't understand them. Please call her at [phone]

## 2016-07-06 NOTE — Telephone Encounter (Signed)
Pt aware of results 

## 2016-07-06 NOTE — Progress Notes (Signed)
cc'ed to pcp °

## 2016-07-06 NOTE — Progress Notes (Signed)
WBC count normal. Liver and kidney function normal. Her sugar is up a little nonfasting.   Her HCV is NOT DETECTED. HIV negative.  She is immune to Hep B, I believe she was previously vaccinated.   Reviewed previous records and she did have detectable HCV RNA in 2010. Her body got rid of it on its own. There is very small chance that she has some HCV RNA there but not at the level it can be detected. General recommendations are to recheck HCV RNA quantitative in 3-6 months over the course of one year. If all are negative then we can say she does not have hep C.  Recheck HCV RNA quantitative in six months.   Proceed with u/s as planned.   She will still need TCS cause she had precancerous polyps on TCS in 2013. Await u/s findings before scheduling.

## 2016-07-06 NOTE — Telephone Encounter (Signed)
Spoke with the pt.  

## 2016-07-07 ENCOUNTER — Other Ambulatory Visit: Payer: Self-pay | Admitting: Gastroenterology

## 2016-07-07 DIAGNOSIS — Z8619 Personal history of other infectious and parasitic diseases: Secondary | ICD-10-CM

## 2016-07-08 ENCOUNTER — Ambulatory Visit (HOSPITAL_COMMUNITY): Payer: Medicare Other

## 2016-07-08 ENCOUNTER — Ambulatory Visit (HOSPITAL_COMMUNITY): Admission: RE | Admit: 2016-07-08 | Payer: Medicare Other | Source: Ambulatory Visit

## 2016-07-19 ENCOUNTER — Ambulatory Visit (HOSPITAL_COMMUNITY): Admission: RE | Admit: 2016-07-19 | Payer: Medicare Other | Source: Ambulatory Visit

## 2016-09-15 NOTE — Progress Notes (Signed)
Patient rescheduled or no showed her u/s 3 times in March. We had planned on tcs after her u/s.  At this point, please offer her f/u ov for due tcs, ?reschedule u/s abd.

## 2016-09-16 ENCOUNTER — Encounter: Payer: Self-pay | Admitting: Gastroenterology

## 2016-09-16 NOTE — Progress Notes (Signed)
APPT MADE AND LETTER SENT  °

## 2016-10-11 ENCOUNTER — Ambulatory Visit: Payer: Medicare Other | Admitting: Gastroenterology

## 2016-10-11 ENCOUNTER — Encounter: Payer: Self-pay | Admitting: Gastroenterology

## 2016-11-11 ENCOUNTER — Other Ambulatory Visit: Payer: Self-pay

## 2016-11-11 DIAGNOSIS — Z8619 Personal history of other infectious and parasitic diseases: Secondary | ICD-10-CM

## 2016-11-18 ENCOUNTER — Other Ambulatory Visit (HOSPITAL_COMMUNITY): Payer: Self-pay | Admitting: Physician Assistant

## 2016-11-18 DIAGNOSIS — M810 Age-related osteoporosis without current pathological fracture: Secondary | ICD-10-CM

## 2016-11-22 ENCOUNTER — Ambulatory Visit (HOSPITAL_COMMUNITY)
Admission: RE | Admit: 2016-11-22 | Discharge: 2016-11-22 | Disposition: A | Discharge status: Home / Self Care | Payer: Medicare Other | Source: Ambulatory Visit | Attending: Physician Assistant | Admitting: Physician Assistant

## 2016-11-22 DIAGNOSIS — M85852 Other specified disorders of bone density and structure, left thigh: Secondary | ICD-10-CM | POA: Insufficient documentation

## 2016-11-22 DIAGNOSIS — M810 Age-related osteoporosis without current pathological fracture: Secondary | ICD-10-CM | POA: Diagnosis present

## 2016-12-22 ENCOUNTER — Other Ambulatory Visit: Payer: Self-pay | Admitting: Gastroenterology

## 2016-12-24 ENCOUNTER — Telehealth: Payer: Self-pay

## 2016-12-24 LAB — HCV RNA QUANT: Hepatitis C Quantitation: NOT DETECTED IU/mL

## 2016-12-24 NOTE — Telephone Encounter (Signed)
HCV results came from [place] and they are negative. I have put the results in [facility] cart.

## 2016-12-28 NOTE — Progress Notes (Signed)
HCV RNA negative.  Unfortunately patient cancelled her ov 10/2016. Due for tcs and u/s. Please offer to reschedule ov. Recheck hcv rna quant once more 06/2017.

## 2016-12-29 ENCOUNTER — Encounter: Payer: Self-pay | Admitting: Gastroenterology

## 2016-12-29 ENCOUNTER — Other Ambulatory Visit: Payer: Self-pay | Admitting: Gastroenterology

## 2016-12-29 DIAGNOSIS — Z8619 Personal history of other infectious and parasitic diseases: Secondary | ICD-10-CM

## 2016-12-29 NOTE — Progress Notes (Signed)
APPT MADE

## 2017-02-07 ENCOUNTER — Ambulatory Visit: Payer: Medicare Other | Admitting: Gastroenterology

## 2017-02-23 ENCOUNTER — Encounter (HOSPITAL_COMMUNITY): Payer: Medicare Other | Attending: Oncology | Admitting: Oncology

## 2017-02-23 ENCOUNTER — Encounter (HOSPITAL_COMMUNITY): Payer: Medicare Other

## 2017-02-23 ENCOUNTER — Encounter (HOSPITAL_COMMUNITY): Payer: Self-pay

## 2017-02-23 ENCOUNTER — Ambulatory Visit (HOSPITAL_COMMUNITY): Payer: Medicare Other

## 2017-02-23 DIAGNOSIS — F17210 Nicotine dependence, cigarettes, uncomplicated: Secondary | ICD-10-CM | POA: Insufficient documentation

## 2017-02-23 DIAGNOSIS — Z9851 Tubal ligation status: Secondary | ICD-10-CM | POA: Diagnosis not present

## 2017-02-23 DIAGNOSIS — F329 Major depressive disorder, single episode, unspecified: Secondary | ICD-10-CM | POA: Diagnosis not present

## 2017-02-23 DIAGNOSIS — K219 Gastro-esophageal reflux disease without esophagitis: Secondary | ICD-10-CM | POA: Insufficient documentation

## 2017-02-23 DIAGNOSIS — D473 Essential (hemorrhagic) thrombocythemia: Secondary | ICD-10-CM | POA: Insufficient documentation

## 2017-02-23 DIAGNOSIS — D75839 Thrombocytosis, unspecified: Secondary | ICD-10-CM | POA: Insufficient documentation

## 2017-02-23 DIAGNOSIS — Z9049 Acquired absence of other specified parts of digestive tract: Secondary | ICD-10-CM | POA: Diagnosis not present

## 2017-02-23 DIAGNOSIS — Z79899 Other long term (current) drug therapy: Secondary | ICD-10-CM | POA: Insufficient documentation

## 2017-02-23 DIAGNOSIS — Z90710 Acquired absence of both cervix and uterus: Secondary | ICD-10-CM | POA: Insufficient documentation

## 2017-02-23 DIAGNOSIS — F419 Anxiety disorder, unspecified: Secondary | ICD-10-CM | POA: Diagnosis not present

## 2017-02-23 LAB — COMPREHENSIVE METABOLIC PANEL
ALT: 22 U/L (ref 14–54)
AST: 24 U/L (ref 15–41)
Albumin: 4.2 g/dL (ref 3.5–5.0)
Alkaline Phosphatase: 53 U/L (ref 38–126)
Anion gap: 10 (ref 5–15)
BUN: 15 mg/dL (ref 6–20)
CO2: 25 mmol/L (ref 22–32)
Calcium: 9.7 mg/dL (ref 8.9–10.3)
Chloride: 107 mmol/L (ref 101–111)
Creatinine, Ser: 0.73 mg/dL (ref 0.44–1.00)
GFR calc Af Amer: >60 mL/min (ref >60)
GFR calc non Af Amer: >60 mL/min (ref >60)
Glucose, Bld: 108 mg/dL — ABNORMAL HIGH (ref 65–99)
Potassium: 3.6 mmol/L (ref 3.5–5.1)
Sodium: 142 mmol/L (ref 135–145)
Total Bilirubin: 0.4 mg/dL (ref 0.3–1.2)
Total Protein: 7.1 g/dL (ref 6.5–8.1)

## 2017-02-23 LAB — CBC WITH DIFFERENTIAL/PLATELET
Basophils Absolute: 0 10*3/uL (ref 0.0–0.1)
Basophils Relative: 0 %
Eosinophils Absolute: 0.2 10*3/uL (ref 0.0–0.7)
Eosinophils Relative: 3 %
HCT: 35.4 % — ABNORMAL LOW (ref 36.0–46.0)
Hemoglobin: 11.9 g/dL — ABNORMAL LOW (ref 12.0–15.0)
Lymphocytes Relative: 29 %
Lymphs Abs: 2.6 10*3/uL (ref 0.7–4.0)
MCH: 30.3 pg (ref 26.0–34.0)
MCHC: 33.6 g/dL (ref 30.0–36.0)
MCV: 90.1 fL (ref 78.0–100.0)
Monocytes Absolute: 0.4 10*3/uL (ref 0.1–1.0)
Monocytes Relative: 5 %
Neutro Abs: 5.7 10*3/uL (ref 1.7–7.7)
Neutrophils Relative %: 63 %
Platelets: 413 10*3/uL — ABNORMAL HIGH (ref 150–400)
RBC: 3.93 MIL/uL (ref 3.87–5.11)
RDW: 12.6 % (ref 11.5–15.5)
WBC: 8.9 10*3/uL (ref 4.0–10.5)

## 2017-02-23 LAB — IRON AND TIBC
Iron: 63 ug/dL (ref 28–170)
Saturation Ratios: 16 % (ref 10.4–31.8)
TIBC: 391 ug/dL (ref 250–450)
UIBC: 328 ug/dL

## 2017-02-23 LAB — LACTATE DEHYDROGENASE: LDH: 143 U/L (ref 98–192)

## 2017-02-23 LAB — SEDIMENTATION RATE: Sed Rate: 19 mm/hr (ref 0–22)

## 2017-02-23 LAB — FERRITIN: Ferritin: 107 ng/mL (ref 11–307)

## 2017-02-23 LAB — C-REACTIVE PROTEIN: CRP: 0.8 mg/dL (ref <1.0)

## 2017-02-23 NOTE — Progress Notes (Signed)
[facility] Initial Visit:  Patient Care Team: Baucom, Alfonso Ellis as PCP - General (Physician Assistant) Gala Romney Cristopher Estimable, MD as Consulting Physician (Gastroenterology)  CHIEF COMPLAINTS/PURPOSE OF CONSULTATION:  Thrombocytosis  HISTORY OF PRESENTING ILLNESS: Sabrina Beasley 56 y.o. female presents today for evaluation thrombocytosis. Most recent CBC from 12/22/16 demonstrated WBC 8.4K, hemoglobin 12.7 g/dL, hematocrit 37%, MCV 90, platelet count 461K. Previous CBC from 07/01/2016 demonstrated that her platelet counts was 488K. She denies any chronic inflammatory disorders such as rheumatoid arthritis, although she does state she has bilateral achy joint pains in her hands. She also has been having achy pains in her right forearm for the past month. She's had a history of carpal tunnel syndrome has surgery twice. She denies any history of any recent bleeding including hematuria, melena, hematochezia. She had a history of endometriosis but had a hysterectomy back in 2003. She states that she gets headaches about 2-3 times a week. She states she has fatigue. She also has intermittent constipation. She states her appetite is good. She denies any chest pain, shortness breath, recent infections, abdominal pain, nausea, vomiting, diarrhea, focal weakness.  Review of Systems - Oncology ROS as per HPI otherwise 12 point ROS is negative.  MEDICAL HISTORY: Past Medical History:  Diagnosis Date  . Anxiety   . Bulging of cervical intervertebral disc    C4-5 &  C6-7  . Constipation   . DDD (degenerative disc disease), cervical    stenosis  . Depression   . Family history of premature CAD   . GERD (gastroesophageal reflux disease)   . Gout   . History of endometriosis   . History of kidney stones     right side w associated n/v  . History of ovarian cyst   . History of rheumatic fever   . Hx of hepatitis C dx 04-16-2009 liver bx   mild portal/ periportal fibrosis-- lower serum  level detection positive antibody virus RNA  . Hyperlipidemia   . OA (osteoarthritis)   . Osteoporosis   . PONV (postoperative nausea and vomiting)    pt states scopalomine patches help  . Pre-diabetes   . Right ureteral stone     SURGICAL HISTORY: Past Surgical History:  Procedure Laterality Date  . CARDIOVASCULAR STRESS TEST  06/19/2015  dr Bronson Ing   normal nuclear study, low risk w/ overall LVSF normal and wall motion normal , stress ef 57%  . CARPAL TUNNEL RELEASE Right x2  last one  . CARPAL TUNNEL RELEASE Left 02/23/2013   Procedure: CARPAL TUNNEL RELEASE;  Surgeon: Carole Civil, MD;  Location: [facility];  Service: Orthopedics;  Laterality: Left;  . CHOLECYSTECTOMY  1999  . COLONOSCOPY  01/2012   Dr. Posey Pronto: tortous colon, multiple colon polyps, 2 cauterized in ascending colon, one transverse colon adenomatous, sigmoid polyps (6) hyperplastic.   Marland Kitchen CYSTOSCOPY W/ RETROGRADES Right 05/07/2016   Procedure: CYSTOSCOPY WITH RETROGRADE PYELOGRAM;  Surgeon: Festus Aloe, MD;  [facility];  Service: Urology;  Laterality: Right;  . DX LAPAROSCOPY/  D & C HYSTEROSCOPY  2001  . ESOPHAGOGASTRODUODENOSCOPY  05/2012   Dr. Posey Pronto: esophagitis, gastritis, no hpylori  . LAPAROSCOPIC SALPINGO OOPHERECTOMY Right 08/24/2006  . RECTOCELE REPAIR N/A 01/13/2016   Procedure: POSTERIOR VAGINAL REPAIR (RECTOCELE);  Surgeon: Jonnie Kind, MD;  Location: [facility];  Service: Gynecology;  Laterality: N/A;  . TOTAL ABDOMINAL HYSTERECTOMY Left 05/22/2002   w/  Left Salpingo Oopherectomy  . TRANSTHORACIC ECHOCARDIOGRAM  06/19/2015  ef 60-65%/  trivial MR and PR  . TUBAL LIGATION  1989    SOCIAL HISTORY: Social History   Social History  . Marital status: Married    Spouse name: N/A  . Number of children: N/A  . Years of education: N/A   Occupational History  . Not on file.   Social History Main Topics  . Smoking status: Current Every Day Smoker    Packs/day: 0.50     Years: 10.00    Types: Cigarettes    Start date: 05/10/1978  . Smokeless tobacco: Never Used  . Alcohol use No  . Drug use: No  . Sexual activity: Not on file   Other Topics Concern  . Not on file   Social History Narrative  . No narrative on file    FAMILY HISTORY Family History  Problem Relation Age of Onset  . Hypertension Mother   . Hyperlipidemia Mother   . Heart disease Father   . Heart disease Brother   . Colon polyps Brother   . Colon cancer Neg Hx     ALLERGIES:  is allergic to nitrofurantoin monohyd macro and sulfa antibiotics.  MEDICATIONS:  Current Outpatient Prescriptions  Medication Sig Dispense Refill  . cetirizine (ZYRTEC) 10 MG tablet Take 10 mg by mouth daily.  2  . DULoxetine (CYMBALTA) 60 MG capsule TK ONE C PO  D  5  . fenofibrate (TRICOR) 145 MG tablet Take 145 mg by mouth daily.    . pantoprazole (PROTONIX) 40 MG tablet Take 1 tablet by mouth daily.    . simvastatin (ZOCOR) 40 MG tablet Take 40 mg by mouth at bedtime.      . traZODone (DESYREL) 150 MG tablet Take 150 mg by mouth at bedtime.       No current facility-administered medications for this visit.     PHYSICAL EXAMINATION:  Vitals:   02/23/17 1308  BP: 130/85  Pulse: 90  Resp: 18  SpO2: 100%    Filed Weights   02/23/17 1308  Weight: 150 lb (68 kg)     Physical Exam Constitutional: Well-developed, well-nourished, and in no distress.   HENT:  Head: Normocephalic and atraumatic.  Mouth/Throat: No oropharyngeal exudate. Mucosa moist. Eyes: Pupils are equal, round, and reactive to light. Conjunctivae are normal. No scleral icterus.  Neck: Normal range of motion. Neck supple. No JVD present.  Cardiovascular: Normal rate, regular rhythm and normal heart sounds.  Exam reveals no gallop and no friction rub.   No murmur heard. Pulmonary/Chest: Effort normal and breath sounds normal. No respiratory distress. No wheezes.No rales.  Abdominal: Soft. Bowel sounds are normal. No  distension. There is no tenderness. There is no guarding.  Musculoskeletal: No edema or tenderness.  Lymphadenopathy:    No cervical or supraclavicular adenopathy.  Neurological: Alert and oriented to person, place, and time. No cranial nerve deficit.  Skin: Skin is warm and dry. No rash noted. No erythema. No pallor.  Psychiatric: Affect and judgment normal.    LABORATORY DATA: I have personally reviewed the data as listed:  No visits with results within 1 Month(s) from this visit.  Latest known visit with results is:  Orders Only on 12/22/2016  Component Date Value Ref Range Status  . Hepatitis C Quantitation 12/22/2016 HCV Not Detected  IU/mL Final  . Test Information 12/22/2016 Comment   Final   The quantitative range of this assay is 15 IU/mL to 100 million IU/mL.    RADIOGRAPHIC STUDIES: I have personally reviewed  the radiological images as listed and agree with the findings in the report  No results found.  ASSESSMENT/PLAN Mild Thrombocytosis  PLAN: Platelets can act as an acute phase reactant, will make sure patient does not have any underlying inflammation or iron deficiency.  Will rule out essential thrombocytosis by checking a JAK2 mutation. Labs today as stated below. RTC in 4 weeks for follow up to review labs and to discuss the next plan of care.  Orders Placed This Encounter  Procedures  . CBC with Differential    Standing Status:   Future    Standing Expiration Date:   02/23/2018  . Comprehensive metabolic panel    Standing Status:   Future    Standing Expiration Date:   02/23/2018  . Lactate dehydrogenase    Standing Status:   Future    Standing Expiration Date:   02/23/2018  . Ferritin    Standing Status:   Future    Standing Expiration Date:   02/23/2018  . Iron and TIBC    Standing Status:   Future    Standing Expiration Date:   02/23/2018  . JAK2 V617F, w Reflex to CALR/E12/MPL  . Sedimentation rate    Standing Status:   Future    Standing  Expiration Date:   02/23/2018  . C-reactive protein    Standing Status:   Future    Standing Expiration Date:   02/23/2018  . Rheumatoid factor    Standing Status:   Future    Standing Expiration Date:   02/23/2018    All questions were answered. The patient knows to call the clinic with any problems, questions or concerns.  This note was electronically signed.    Twana First, MD  02/23/2017 1:17 PM

## 2017-03-01 ENCOUNTER — Encounter: Payer: Self-pay | Admitting: Podiatry

## 2017-03-01 ENCOUNTER — Ambulatory Visit (INDEPENDENT_AMBULATORY_CARE_PROVIDER_SITE_OTHER): Payer: Medicare Other | Admitting: Podiatry

## 2017-03-01 ENCOUNTER — Ambulatory Visit (INDEPENDENT_AMBULATORY_CARE_PROVIDER_SITE_OTHER): Payer: Medicare Other

## 2017-03-01 VITALS — BP 136/78 | HR 78

## 2017-03-01 DIAGNOSIS — M722 Plantar fascial fibromatosis: Secondary | ICD-10-CM

## 2017-03-01 NOTE — Patient Instructions (Signed)

## 2017-03-01 NOTE — Progress Notes (Signed)
Subjective:  Patient ID: Sabrina Beasley, female    DOB: [birth year],  MRN: [id number] HPI Chief Complaint  Patient presents with  . Plantar Fasciitis    bilateral heel pain, off and on for years, has tried inj, inserts, shoes and anti-infmtry    56 y.o. female presents with the above complaint. She is an 18 year history of plantar fasciitis. She has seen multiple doctors including Dr. Blanch Media in [place]. She also saw Dr. [place]. She has had multiple injections over the years steroids nonsteroidals orthotics shoe gear changes all to no avail. She states it is beginning to affect her ability to live her life and perform her daily activities.    Past Medical History:  Diagnosis Date  . Anxiety   . Bulging of cervical intervertebral disc    C4-5 &  C6-7  . Constipation   . DDD (degenerative disc disease), cervical    stenosis  . Depression   . Family history of premature CAD   . GERD (gastroesophageal reflux disease)   . Gout   . History of endometriosis   . History of kidney stones     right side w associated n/v  . History of ovarian cyst   . History of rheumatic fever   . Hx of hepatitis C dx 04-16-2009 liver bx   mild portal/ periportal fibrosis-- lower serum level detection positive antibody virus RNA  . Hyperlipidemia   . OA (osteoarthritis)   . Osteoporosis   . PONV (postoperative nausea and vomiting)    pt states scopalomine patches help  . Pre-diabetes   . Right ureteral stone    Past Surgical History:  Procedure Laterality Date  . CARDIOVASCULAR STRESS TEST  06/19/2015  dr Bronson Ing   normal nuclear study, low risk w/ overall LVSF normal and wall motion normal , stress ef 57%  . CARPAL TUNNEL RELEASE Right x2  last one  . CARPAL TUNNEL RELEASE Left 02/23/2013   Procedure: CARPAL TUNNEL RELEASE;  Surgeon: Carole Civil, MD;  Location: [facility];  Service: Orthopedics;  Laterality: Left;  . CHOLECYSTECTOMY  1999  . COLONOSCOPY  01/2012   Dr. Posey Pronto:  tortous colon, multiple colon polyps, 2 cauterized in ascending colon, one transverse colon adenomatous, sigmoid polyps (6) hyperplastic.   Marland Kitchen CYSTOSCOPY W/ RETROGRADES Right 05/07/2016   Procedure: CYSTOSCOPY WITH RETROGRADE PYELOGRAM;  Surgeon: Festus Aloe, MD;  Location[facility];  Service: Urology;  Laterality: Right;  . DX LAPAROSCOPY/  D & C HYSTEROSCOPY  2001  . ESOPHAGOGASTRODUODENOSCOPY  05/2012   Dr. Posey Pronto: esophagitis, gastritis, no hpylori  . LAPAROSCOPIC SALPINGO OOPHERECTOMY Right 08/24/2006  . RECTOCELE REPAIR N/A 01/13/2016   Procedure: POSTERIOR VAGINAL REPAIR (RECTOCELE);  Surgeon: Jonnie Kind, MD;  Location: [facility];  Service: Gynecology;  Laterality: N/A;  . TOTAL ABDOMINAL HYSTERECTOMY Left 05/22/2002   w/  Left Salpingo Oopherectomy  . TRANSTHORACIC ECHOCARDIOGRAM  06/19/2015   ef 60-65%/  trivial MR and PR  . TUBAL LIGATION  1989    Current Outpatient Prescriptions:  .  cetirizine (ZYRTEC) 10 MG tablet, Take 10 mg by mouth daily., Disp: , Rfl: 2 .  DULoxetine (CYMBALTA) 30 MG capsule, , Disp: , Rfl:  .  fenofibrate (TRICOR) 145 MG tablet, Take 145 mg by mouth daily., Disp: , Rfl:  .  mirtazapine (REMERON) 15 MG tablet, , Disp: , Rfl:  .  pantoprazole (PROTONIX) 40 MG tablet, Take 1 tablet by mouth daily., Disp: , Rfl:  .  polyethylene glycol powder (GLYCOLAX/MIRALAX) powder, , Disp: , Rfl:  .  simvastatin (ZOCOR) 40 MG tablet, Take 40 mg by mouth at bedtime.  , Disp: , Rfl:  .  traZODone (DESYREL) 150 MG tablet, Take 150 mg by mouth at bedtime.  , Disp: , Rfl:   Allergies  Allergen Reactions  . Nitrofurantoin Monohyd Macro Nausea And Vomiting    Also gets shaky  . Sulfa Antibiotics Rash   Review of Systems  All other systems reviewed and are negative.  Objective:   Vitals:   03/01/17 1051  BP: 136/78  Pulse: 78    General: Well developed, nourished, in no acute distress, alert and oriented x3   Dermatological: Skin is warm,  dry and supple bilateral. Nails x 10 are well maintained; remaining integument appears unremarkable at this time. There are no open sores, no preulcerative lesions, no rash or signs of infection present.  Vascular: Dorsalis Pedis artery and Posterior Tibial artery pedal pulses are 2/4 bilateral with immedate capillary fill time. Pedal hair growth present. No varicosities and no lower extremity edema present bilateral.   Neruologic: Grossly intact via light touch bilateral. Vibratory intact via tuning fork bilateral. Protective threshold with Semmes Wienstein monofilament intact to all pedal sites bilateral. Patellar and Achilles deep tendon reflexes 2+ bilateral. No Babinski or clonus noted bilateral.   Musculoskeletal: No gross boney pedal deformities bilateral. No pain, crepitus, or limitation noted with foot and ankle range of motion bilateral. Muscular strength 5/5 in all groups tested bilateral.she has severe pain on palpation medial calcaneal tubercle right greater than left. There is no pain on palpation of theCentral and lateral calcaneal tubercles.She also has gastroc equinus.  Gait: Unassisted, Nonantalgic.    Radiographs:  3 views radiographs taken today in the office demonstrate an osseously mature individual with plantar distally oriented calcaneal heel spurs. No fractures are identified. She does have soft tissue increase in density at the plantar fascial calcaneal insertion site.  Assessment & Plan:   Assessment: chronic intractable plantar fasciitis 18 years.    Plan: t this point due to chronic long-standing plantar fasciitis and failure of all conservative therapiesI feel it necessary for an MRI to be performed.we will perform an MRI of the right rear foot.  We discussed the need for an endoscopic plantar fasciotomy We also disussed the need for a gastrocnemius recession.     Cire Deyarmin [employer], [place]

## 2017-03-02 ENCOUNTER — Telehealth: Payer: Self-pay | Admitting: *Deleted

## 2017-03-02 DIAGNOSIS — M722 Plantar fascial fibromatosis: Secondary | ICD-10-CM

## 2017-03-02 NOTE — Telephone Encounter (Addendum)
-----   Message from Rip Harbour, [facility] sent at 03/01/2017 11:36 AM EDT ----- Regarding: MRI  MRI rearfoot right - 18 year history plantar fasciitis, gastroc equinus - surgical consideration. I spoke with Oliver, she states order right ankle. Orders faxed to [facility]. [place], "THIS [place] MEDICAID MEMBER DOES NOT REQUIRE PRIOR AUTHORIZATION FOR OUTPATIENT RADIOLOGY THROUGH [facility] OR [facility] AT THIS TIME."

## 2017-03-07 LAB — CALR + JAK2 E12-15 + MPL (REFLEXED)

## 2017-03-07 LAB — JAK2 V617F, W REFLEX TO CALR/E12/MPL

## 2017-03-12 ENCOUNTER — Ambulatory Visit
Admission: RE | Admit: 2017-03-12 | Discharge: 2017-03-12 | Disposition: A | Discharge status: Home / Self Care | Payer: Medicare Other | Source: Ambulatory Visit | Attending: Podiatry | Admitting: Podiatry

## 2017-03-12 DIAGNOSIS — M19071 Primary osteoarthritis, right ankle and foot: Secondary | ICD-10-CM | POA: Diagnosis not present

## 2017-03-17 ENCOUNTER — Ambulatory Visit (INDEPENDENT_AMBULATORY_CARE_PROVIDER_SITE_OTHER): Payer: Medicare Other | Admitting: Podiatry

## 2017-03-17 DIAGNOSIS — M722 Plantar fascial fibromatosis: Secondary | ICD-10-CM | POA: Diagnosis not present

## 2017-03-17 NOTE — Progress Notes (Signed)
She presents today for follow-up of her MRI and her plantar fasciitis. She states this still hurts horribly.  Objective: Vital signs are stable she is alert and oriented 3. Pulses are strongly palpable. Neurologic sensorium is intact. Deep tendon reflexes are intact. MRI does demonstrate a severe plantar fasciitis of the right foot.  Assessment: Severe plantar fasciitis right foot.  Plan: We discussed the need for surgical intervention today. At this point we went over a consent form today line by line number by #2 her ample time to ask questions she saw fit regarding a endoscopic plantar fasciotomy of her left foot. I answered all the questions regarding these procedures are the best my ability in layman's terms. She understood this was amenable to it and signed all 3 pages of the consent form. We dispensed a Cam Walker for postop ambulation and I will follow-up with her degrees for especially surgical Center in the near future. We did discuss anesthesia today as well.

## 2017-03-17 NOTE — Patient Instructions (Signed)
Pre-Operative Instructions  Congratulations, you have decided to take an important step towards improving your quality of life.  You can be assured that the doctors and staff at [facility] will be with you every step of the way.  Here are some important things you should know:  1. Plan to be at the surgery center/hospital at least 1 (one) hour prior to your scheduled time, unless otherwise directed by the surgical center/hospital staff.  You must have a responsible adult accompany you, remain during the surgery and drive you home.  Make sure you have directions to the surgical center/hospital to ensure you arrive on time. 2. If you are having surgery at [facility] or [facility] hospitals, you will need a copy of your medical history and physical form from your family physician within one month prior to the date of surgery. We will give you a form for your primary physician to complete.  3. We make every effort to accommodate the date you request for surgery.  However, there are times where surgery dates or times have to be moved.  We will contact you as soon as possible if a change in schedule is required.   4. No aspirin/ibuprofen for one week before surgery.  If you are on aspirin, any non-steroidal anti-inflammatory medications (Mobic, Aleve, Ibuprofen) should not be taken seven (7) days prior to your surgery.  You make take Tylenol for pain prior to surgery.  5. Medications - If you are taking daily heart and blood pressure medications, seizure, reflux, allergy, asthma, anxiety, pain or diabetes medications, make sure you notify the surgery center/hospital before the day of surgery so they can tell you which medications you should take or avoid the day of surgery. 6. No food or drink after midnight the night before surgery unless directed otherwise by surgical center/hospital staff. 7. No alcoholic beverages 24-hours prior to surgery.  No smoking 24-hours prior or 24-hours after  surgery. 8. Wear loose pants or shorts. They should be loose enough to fit over bandages, boots, and casts. 9. Don't wear slip-on shoes. Sneakers are preferred. 10. Bring your boot with you to the surgery center/hospital.  Also bring crutches or a walker if your physician has prescribed it for you.  If you do not have this equipment, it will be provided for you after surgery. 11. If you have not been contacted by the surgery center/hospital by the day before your surgery, call to confirm the date and time of your surgery. 12. Leave-time from work may vary depending on the type of surgery you have.  Appropriate arrangements should be made prior to surgery with your employer. 13. Prescriptions will be provided immediately following surgery by your doctor.  Fill these as soon as possible after surgery and take the medication as directed. Pain medications will not be refilled on weekends and must be approved by the doctor. 14. Remove nail polish on the operative foot and avoid getting pedicures prior to surgery. 15. Wash the night before surgery.  The night before surgery wash the foot and leg well with water and the antibacterial soap provided. Be sure to pay special attention to beneath the toenails and in between the toes.  Wash for at least three (3) minutes. Rinse thoroughly with water and dry well with a towel.  Perform this wash unless told not to do so by your physician.  Enclosed: 1 Ice pack (please put in freezer the night before surgery)   1 Hibiclens skin cleaner     Pre-op instructions  If you have any questions regarding the instructions, please do not hesitate to call our office.  [place]: [address], [place], [place] [address] -- [phone]  [place]: [address] [place], [place] [address] -- [phone]  [place]: [address]  , [place] [address] -- [phone]  [place]: [address], [address], [place], [place] [address] -- [phone]  Website:  [email or web address] 

## 2017-03-21 ENCOUNTER — Ambulatory Visit: Payer: Medicare Other | Admitting: Gastroenterology

## 2017-03-21 ENCOUNTER — Telehealth: Payer: Self-pay | Admitting: Gastroenterology

## 2017-03-21 ENCOUNTER — Encounter: Payer: Self-pay | Admitting: Internal Medicine

## 2017-03-21 NOTE — Telephone Encounter (Signed)
Patient was a no show and letter sent  °

## 2017-03-23 ENCOUNTER — Ambulatory Visit (HOSPITAL_COMMUNITY): Payer: Medicare Other

## 2017-04-05 ENCOUNTER — Other Ambulatory Visit: Payer: Self-pay | Admitting: Podiatry

## 2017-04-05 MED ORDER — OXYCODONE-ACETAMINOPHEN 10-325 MG PO TABS: 1.0000 | ORAL_TABLET | ORAL | 0 refills | Status: DC | PRN

## 2017-04-05 MED ORDER — CEPHALEXIN 500 MG PO CAPS: 500.0000 mg | ORAL_CAPSULE | Freq: Three times a day (TID) | ORAL | 0 refills | Status: DC

## 2017-04-05 MED ORDER — PROMETHAZINE HCL 25 MG PO TABS: 25.0000 mg | ORAL_TABLET | Freq: Three times a day (TID) | ORAL | 0 refills | Status: DC | PRN

## 2017-04-06 DIAGNOSIS — F5109 Other insomnia not due to a substance or known physiological condition: Secondary | ICD-10-CM | POA: Diagnosis not present

## 2017-04-06 DIAGNOSIS — R7303 Prediabetes: Secondary | ICD-10-CM | POA: Diagnosis not present

## 2017-04-08 ENCOUNTER — Encounter: Payer: Self-pay | Admitting: Podiatry

## 2017-04-08 DIAGNOSIS — M25571 Pain in right ankle and joints of right foot: Secondary | ICD-10-CM | POA: Diagnosis not present

## 2017-04-08 DIAGNOSIS — M722 Plantar fascial fibromatosis: Secondary | ICD-10-CM | POA: Diagnosis not present

## 2017-04-11 ENCOUNTER — Encounter: Payer: Self-pay | Admitting: *Deleted

## 2017-04-11 ENCOUNTER — Telehealth: Payer: Self-pay | Admitting: *Deleted

## 2017-04-11 NOTE — Telephone Encounter (Signed)
POST OP CALL  Called pt - no answer, left message on voicemail to give [place] a call back if she had any problems or concerns since her surgery

## 2017-04-14 ENCOUNTER — Encounter: Payer: Self-pay | Admitting: Podiatry

## 2017-04-14 ENCOUNTER — Ambulatory Visit: Payer: Medicare Other | Admitting: Podiatry

## 2017-04-14 DIAGNOSIS — M722 Plantar fascial fibromatosis: Secondary | ICD-10-CM

## 2017-04-14 NOTE — Telephone Encounter (Signed)
This encounter was created in error - please disregard.

## 2017-04-14 NOTE — Progress Notes (Signed)
She presents today 1 week status post endoscopic plantar fasciotomy right heel.  She denies fever chills nausea vomiting muscle aches and pain continues to wear her cam walker at all times.  Objective: Vital signs are stable alert and oriented x3 presents in a Cam walker dry sterile dressing intact was removed demonstrates no erythema edema cellulitis drainage or odor sutures are intact margins well coapted no signs of infection.  Assessment: Well-healing surgical foot endoscopic fasciotomy times 1 week right.  Plan: Redressed today dressed a compressive dressing continue use of the Cam walker follow-up with me in 1 week for suture removal.

## 2017-04-21 ENCOUNTER — Ambulatory Visit (INDEPENDENT_AMBULATORY_CARE_PROVIDER_SITE_OTHER): Payer: Medicare Other | Admitting: Podiatry

## 2017-04-21 DIAGNOSIS — M722 Plantar fascial fibromatosis: Secondary | ICD-10-CM | POA: Diagnosis not present

## 2017-04-21 NOTE — Progress Notes (Signed)
Status post endoscopic plantar fasciotomy right foot.  She states that she is doing very well.  Date of surgery was 1130.  Vital signs are stable alert and oriented times 3 sutures are intact  Assessment well-healing surgical foot.  Plan: We will follow-up with her in a couple of weeks.  Continue other conservative therapies.  Continue use of the walker and night splint.

## 2017-05-12 ENCOUNTER — Encounter: Payer: Self-pay | Admitting: Podiatry

## 2017-05-12 ENCOUNTER — Ambulatory Visit (INDEPENDENT_AMBULATORY_CARE_PROVIDER_SITE_OTHER): Payer: Medicare Other | Admitting: Podiatry

## 2017-05-12 DIAGNOSIS — M722 Plantar fascial fibromatosis: Secondary | ICD-10-CM

## 2017-05-14 NOTE — Progress Notes (Signed)
She presents today status post endoscopic plantar fasciotomy date of surgery 04/08/2017.  She states that the foot tingles occasionally particularly on the top at times she says [place] to get a little generalized ankle soreness at times but otherwise seems to be doing pretty well.  Objective: Vital signs are stable she is alert and oriented x3 much decrease in pain on palpation no erythema cellulitis drainage or odor no signs of infection.  Nice palpable release of the medial band of the plantar fascia of the right heel.  Assessment: Well-healing surgical foot.  We will allow her to get back to her regular routine activities and I will follow-up with her in 1 month for her final visit.

## 2017-05-17 ENCOUNTER — Other Ambulatory Visit (HOSPITAL_COMMUNITY): Payer: Self-pay | Admitting: Family Medicine

## 2017-05-17 DIAGNOSIS — Z1231 Encounter for screening mammogram for malignant neoplasm of breast: Secondary | ICD-10-CM

## 2017-05-23 ENCOUNTER — Ambulatory Visit (HOSPITAL_COMMUNITY): Payer: Medicare Other | Admitting: Oncology

## 2017-05-30 ENCOUNTER — Other Ambulatory Visit: Payer: Self-pay

## 2017-05-30 DIAGNOSIS — Z8619 Personal history of other infectious and parasitic diseases: Secondary | ICD-10-CM

## 2017-06-10 ENCOUNTER — Ambulatory Visit (HOSPITAL_COMMUNITY): Payer: Medicare Other | Admitting: Oncology

## 2017-06-23 ENCOUNTER — Telehealth: Payer: Self-pay | Admitting: Podiatry

## 2017-06-23 NOTE — Telephone Encounter (Signed)
I told pt to go back in to the cam boot to stabilize structures and decrease pain, and transferred to schedulers for an appt next week.

## 2017-06-23 NOTE — Telephone Encounter (Signed)
I had foot surgery on my right foot for plantar fasciitis back on 08 April 2017. Yesterday I was and I just heard something pop in that foot and it caused a lot of pain. I'm in discomfort and I wanted to know if that is something I should be concerned about. If you could please call and let me know. My number is [phone]. Thank you.

## 2017-06-27 ENCOUNTER — Ambulatory Visit (HOSPITAL_COMMUNITY): Payer: Medicare Other

## 2017-06-30 ENCOUNTER — Encounter: Payer: Self-pay | Admitting: Podiatry

## 2017-06-30 ENCOUNTER — Ambulatory Visit (INDEPENDENT_AMBULATORY_CARE_PROVIDER_SITE_OTHER): Payer: Medicare Other | Admitting: Podiatry

## 2017-06-30 ENCOUNTER — Ambulatory Visit (INDEPENDENT_AMBULATORY_CARE_PROVIDER_SITE_OTHER): Payer: Medicare Other

## 2017-06-30 DIAGNOSIS — S93691A Other sprain of right foot, initial encounter: Secondary | ICD-10-CM | POA: Diagnosis not present

## 2017-07-03 NOTE — Progress Notes (Signed)
She presents today she is status post endoscopic plantar fasciotomy April 08, 2017.  She states that she was walking along in the yard and heard a pop in her felt pain in her heel.  She states that it has improved a little bit since the injury.  Objective: Vital signs are stable alert and oriented x3.  Pulses are palpable.  She has pain on palpation medial longitudinal arch I am unable to palpate any plantar fascia.  There is no tension of the plantar fascia at all.  More than likely she ruptured the entire plantar fascia.  Radiographs taken today do not demonstrate any type of acute osseous findings.  Assessment: Plantar fascial rupture.  Plan: Encouraged her to get back into her Cam walker and stay in this for the next 2-3 weeks I will follow-up with her at that time.

## 2017-07-04 DIAGNOSIS — R509 Fever, unspecified: Secondary | ICD-10-CM | POA: Diagnosis not present

## 2017-07-08 ENCOUNTER — Ambulatory Visit (HOSPITAL_COMMUNITY)
Admission: RE | Admit: 2017-07-08 | Discharge: 2017-07-08 | Disposition: A | Discharge status: Home / Self Care | Payer: Medicare Other | Source: Ambulatory Visit | Attending: Family Medicine | Admitting: Family Medicine

## 2017-07-08 ENCOUNTER — Encounter (HOSPITAL_COMMUNITY): Payer: Self-pay

## 2017-07-08 DIAGNOSIS — Z1231 Encounter for screening mammogram for malignant neoplasm of breast: Secondary | ICD-10-CM | POA: Diagnosis not present

## 2017-07-13 DIAGNOSIS — R110 Nausea: Secondary | ICD-10-CM | POA: Diagnosis not present

## 2017-07-13 DIAGNOSIS — K5900 Constipation, unspecified: Secondary | ICD-10-CM | POA: Diagnosis not present

## 2017-07-13 DIAGNOSIS — K219 Gastro-esophageal reflux disease without esophagitis: Secondary | ICD-10-CM | POA: Diagnosis not present

## 2017-07-13 DIAGNOSIS — Z8371 Family history of colonic polyps: Secondary | ICD-10-CM | POA: Diagnosis not present

## 2017-07-13 DIAGNOSIS — Z85038 Personal history of other malignant neoplasm of large intestine: Secondary | ICD-10-CM | POA: Diagnosis not present

## 2017-07-15 DIAGNOSIS — Z1389 Encounter for screening for other disorder: Secondary | ICD-10-CM | POA: Diagnosis not present

## 2017-07-15 DIAGNOSIS — E119 Type 2 diabetes mellitus without complications: Secondary | ICD-10-CM | POA: Diagnosis not present

## 2017-07-15 DIAGNOSIS — I10 Essential (primary) hypertension: Secondary | ICD-10-CM | POA: Diagnosis not present

## 2017-07-15 DIAGNOSIS — R7309 Other abnormal glucose: Secondary | ICD-10-CM | POA: Diagnosis not present

## 2017-07-29 DIAGNOSIS — Z86010 Personal history of colonic polyps: Secondary | ICD-10-CM | POA: Diagnosis not present

## 2017-07-29 DIAGNOSIS — D122 Benign neoplasm of ascending colon: Secondary | ICD-10-CM | POA: Diagnosis not present

## 2017-07-29 DIAGNOSIS — K635 Polyp of colon: Secondary | ICD-10-CM | POA: Diagnosis not present

## 2017-07-29 DIAGNOSIS — K621 Rectal polyp: Secondary | ICD-10-CM | POA: Diagnosis not present

## 2017-07-29 DIAGNOSIS — Z800 Family history of malignant neoplasm of digestive organs: Secondary | ICD-10-CM | POA: Diagnosis not present

## 2017-07-29 DIAGNOSIS — D123 Benign neoplasm of transverse colon: Secondary | ICD-10-CM | POA: Diagnosis not present

## 2017-07-29 DIAGNOSIS — Z882 Allergy status to sulfonamides status: Secondary | ICD-10-CM | POA: Diagnosis not present

## 2017-07-29 DIAGNOSIS — D124 Benign neoplasm of descending colon: Secondary | ICD-10-CM | POA: Diagnosis not present

## 2017-07-29 DIAGNOSIS — Z885 Allergy status to narcotic agent status: Secondary | ICD-10-CM | POA: Diagnosis not present

## 2017-07-29 DIAGNOSIS — K219 Gastro-esophageal reflux disease without esophagitis: Secondary | ICD-10-CM | POA: Diagnosis not present

## 2017-08-12 DIAGNOSIS — K5900 Constipation, unspecified: Secondary | ICD-10-CM | POA: Diagnosis not present

## 2017-08-12 DIAGNOSIS — Z86010 Personal history of colonic polyps: Secondary | ICD-10-CM | POA: Diagnosis not present

## 2017-08-22 DIAGNOSIS — E7800 Pure hypercholesterolemia, unspecified: Secondary | ICD-10-CM | POA: Diagnosis not present

## 2017-08-22 DIAGNOSIS — S066X0A Traumatic subarachnoid hemorrhage without loss of consciousness, initial encounter: Secondary | ICD-10-CM | POA: Diagnosis not present

## 2017-08-22 DIAGNOSIS — D72829 Elevated white blood cell count, unspecified: Secondary | ICD-10-CM | POA: Diagnosis not present

## 2017-08-22 DIAGNOSIS — Z8679 Personal history of other diseases of the circulatory system: Secondary | ICD-10-CM | POA: Diagnosis not present

## 2017-08-22 DIAGNOSIS — Z9889 Other specified postprocedural states: Secondary | ICD-10-CM | POA: Diagnosis not present

## 2017-08-22 DIAGNOSIS — I6031 Nontraumatic subarachnoid hemorrhage from right posterior communicating artery: Secondary | ICD-10-CM | POA: Diagnosis not present

## 2017-08-22 DIAGNOSIS — R110 Nausea: Secondary | ICD-10-CM | POA: Diagnosis not present

## 2017-08-22 DIAGNOSIS — I10 Essential (primary) hypertension: Secondary | ICD-10-CM | POA: Diagnosis not present

## 2017-08-22 DIAGNOSIS — D649 Anemia, unspecified: Secondary | ICD-10-CM | POA: Diagnosis not present

## 2017-08-22 DIAGNOSIS — I609 Nontraumatic subarachnoid hemorrhage, unspecified: Secondary | ICD-10-CM | POA: Diagnosis not present

## 2017-08-22 DIAGNOSIS — R51 Headache: Secondary | ICD-10-CM | POA: Diagnosis not present

## 2017-08-22 DIAGNOSIS — Z9989 Dependence on other enabling machines and devices: Secondary | ICD-10-CM | POA: Diagnosis not present

## 2017-08-22 DIAGNOSIS — Z882 Allergy status to sulfonamides status: Secondary | ICD-10-CM | POA: Diagnosis not present

## 2017-08-22 DIAGNOSIS — E876 Hypokalemia: Secondary | ICD-10-CM | POA: Diagnosis not present

## 2017-08-22 DIAGNOSIS — Z48811 Encounter for surgical aftercare following surgery on the nervous system: Secondary | ICD-10-CM | POA: Diagnosis not present

## 2017-08-22 DIAGNOSIS — R112 Nausea with vomiting, unspecified: Secondary | ICD-10-CM | POA: Diagnosis not present

## 2017-08-22 DIAGNOSIS — G4489 Other headache syndrome: Secondary | ICD-10-CM | POA: Diagnosis not present

## 2017-08-22 DIAGNOSIS — I67848 Other cerebrovascular vasospasm and vasoconstriction: Secondary | ICD-10-CM | POA: Diagnosis not present

## 2017-08-22 DIAGNOSIS — K219 Gastro-esophageal reflux disease without esophagitis: Secondary | ICD-10-CM | POA: Diagnosis not present

## 2017-08-22 DIAGNOSIS — Z95828 Presence of other vascular implants and grafts: Secondary | ICD-10-CM | POA: Diagnosis not present

## 2017-08-22 DIAGNOSIS — I671 Cerebral aneurysm, nonruptured: Secondary | ICD-10-CM | POA: Diagnosis not present

## 2017-09-22 DIAGNOSIS — I671 Cerebral aneurysm, nonruptured: Secondary | ICD-10-CM | POA: Diagnosis not present

## 2017-09-22 DIAGNOSIS — Z1389 Encounter for screening for other disorder: Secondary | ICD-10-CM | POA: Diagnosis not present

## 2017-09-22 DIAGNOSIS — R51 Headache: Secondary | ICD-10-CM | POA: Diagnosis not present

## 2017-10-13 DIAGNOSIS — I607 Nontraumatic subarachnoid hemorrhage from unspecified intracranial artery: Secondary | ICD-10-CM | POA: Diagnosis not present

## 2017-10-25 DIAGNOSIS — H2513 Age-related nuclear cataract, bilateral: Secondary | ICD-10-CM | POA: Diagnosis not present

## 2017-11-28 DIAGNOSIS — H5340 Unspecified visual field defects: Secondary | ICD-10-CM | POA: Diagnosis not present

## 2017-12-01 DIAGNOSIS — Z1389 Encounter for screening for other disorder: Secondary | ICD-10-CM | POA: Diagnosis not present

## 2017-12-01 DIAGNOSIS — I10 Essential (primary) hypertension: Secondary | ICD-10-CM | POA: Diagnosis not present

## 2017-12-01 DIAGNOSIS — R7309 Other abnormal glucose: Secondary | ICD-10-CM | POA: Diagnosis not present

## 2017-12-01 DIAGNOSIS — I607 Nontraumatic subarachnoid hemorrhage from unspecified intracranial artery: Secondary | ICD-10-CM | POA: Diagnosis not present

## 2017-12-28 DIAGNOSIS — H5203 Hypermetropia, bilateral: Secondary | ICD-10-CM | POA: Diagnosis not present

## 2017-12-28 DIAGNOSIS — H5213 Myopia, bilateral: Secondary | ICD-10-CM | POA: Diagnosis not present

## 2018-01-05 DIAGNOSIS — E782 Mixed hyperlipidemia: Secondary | ICD-10-CM | POA: Diagnosis not present

## 2018-01-05 DIAGNOSIS — I10 Essential (primary) hypertension: Secondary | ICD-10-CM | POA: Diagnosis not present

## 2018-01-05 DIAGNOSIS — K219 Gastro-esophageal reflux disease without esophagitis: Secondary | ICD-10-CM | POA: Diagnosis not present

## 2018-01-07 DIAGNOSIS — K0530 Chronic periodontitis, unspecified: Secondary | ICD-10-CM | POA: Diagnosis not present

## 2018-01-30 DIAGNOSIS — Z09 Encounter for follow-up examination after completed treatment for conditions other than malignant neoplasm: Secondary | ICD-10-CM | POA: Diagnosis not present

## 2018-01-30 DIAGNOSIS — Z87891 Personal history of nicotine dependence: Secondary | ICD-10-CM | POA: Diagnosis not present

## 2018-01-30 DIAGNOSIS — Z8679 Personal history of other diseases of the circulatory system: Secondary | ICD-10-CM | POA: Diagnosis not present

## 2018-01-30 DIAGNOSIS — I10 Essential (primary) hypertension: Secondary | ICD-10-CM | POA: Diagnosis not present

## 2018-02-07 DIAGNOSIS — Z8679 Personal history of other diseases of the circulatory system: Secondary | ICD-10-CM | POA: Diagnosis not present

## 2018-02-07 DIAGNOSIS — I671 Cerebral aneurysm, nonruptured: Secondary | ICD-10-CM | POA: Insufficient documentation

## 2018-02-07 DIAGNOSIS — Z87891 Personal history of nicotine dependence: Secondary | ICD-10-CM | POA: Diagnosis not present

## 2018-02-07 DIAGNOSIS — I10 Essential (primary) hypertension: Secondary | ICD-10-CM | POA: Diagnosis not present

## 2018-02-07 DIAGNOSIS — Z95828 Presence of other vascular implants and grafts: Secondary | ICD-10-CM | POA: Diagnosis not present

## 2018-02-07 DIAGNOSIS — Z09 Encounter for follow-up examination after completed treatment for conditions other than malignant neoplasm: Secondary | ICD-10-CM | POA: Diagnosis not present

## 2018-02-17 DIAGNOSIS — Z1389 Encounter for screening for other disorder: Secondary | ICD-10-CM | POA: Diagnosis not present

## 2018-03-09 DIAGNOSIS — Z0001 Encounter for general adult medical examination with abnormal findings: Secondary | ICD-10-CM | POA: Diagnosis not present

## 2018-03-09 DIAGNOSIS — Z0000 Encounter for general adult medical examination without abnormal findings: Secondary | ICD-10-CM | POA: Diagnosis not present

## 2018-03-09 DIAGNOSIS — K219 Gastro-esophageal reflux disease without esophagitis: Secondary | ICD-10-CM | POA: Diagnosis not present

## 2018-03-09 DIAGNOSIS — Z1389 Encounter for screening for other disorder: Secondary | ICD-10-CM | POA: Diagnosis not present

## 2018-03-09 DIAGNOSIS — E119 Type 2 diabetes mellitus without complications: Secondary | ICD-10-CM | POA: Diagnosis not present

## 2018-03-14 DIAGNOSIS — R42 Dizziness and giddiness: Secondary | ICD-10-CM | POA: Diagnosis not present

## 2018-03-14 DIAGNOSIS — I10 Essential (primary) hypertension: Secondary | ICD-10-CM | POA: Diagnosis not present

## 2018-03-14 DIAGNOSIS — Z882 Allergy status to sulfonamides status: Secondary | ICD-10-CM | POA: Diagnosis not present

## 2018-03-15 DIAGNOSIS — R42 Dizziness and giddiness: Secondary | ICD-10-CM | POA: Diagnosis not present

## 2018-04-03 DIAGNOSIS — H6692 Otitis media, unspecified, left ear: Secondary | ICD-10-CM | POA: Diagnosis not present

## 2018-06-01 DIAGNOSIS — R21 Rash and other nonspecific skin eruption: Secondary | ICD-10-CM | POA: Diagnosis not present

## 2018-06-08 DIAGNOSIS — I10 Essential (primary) hypertension: Secondary | ICD-10-CM | POA: Diagnosis not present

## 2018-06-08 DIAGNOSIS — J029 Acute pharyngitis, unspecified: Secondary | ICD-10-CM | POA: Diagnosis not present

## 2018-06-08 DIAGNOSIS — L503 Dermatographic urticaria: Secondary | ICD-10-CM | POA: Diagnosis not present

## 2018-06-08 DIAGNOSIS — Z1389 Encounter for screening for other disorder: Secondary | ICD-10-CM | POA: Diagnosis not present

## 2018-06-19 ENCOUNTER — Ambulatory Visit: Payer: Medicare Other | Admitting: Nurse Practitioner

## 2018-06-19 ENCOUNTER — Encounter: Payer: Self-pay | Admitting: Nurse Practitioner

## 2018-06-19 VITALS — BP 125/74 | HR 64 | Temp 97.4°F | Ht 59.0 in | Wt 153.8 lb

## 2018-06-19 DIAGNOSIS — K5900 Constipation, unspecified: Secondary | ICD-10-CM

## 2018-06-19 DIAGNOSIS — K219 Gastro-esophageal reflux disease without esophagitis: Secondary | ICD-10-CM | POA: Diagnosis not present

## 2018-06-19 DIAGNOSIS — Z8619 Personal history of other infectious and parasitic diseases: Secondary | ICD-10-CM | POA: Diagnosis not present

## 2018-06-19 MED ORDER — OMEPRAZOLE 40 MG PO CPDR: 40.0000 mg | DELAYED_RELEASE_CAPSULE | Freq: Every day | ORAL | 3 refills | Status: DC

## 2018-06-19 NOTE — Patient Instructions (Signed)
Your health issues we discussed today were:   Previous positive hepatitis C RNA: 1. I will check your hepatitis C RNA to ensure it is not detectable 2. Further recommendations to follow  Heartburn (GERD): 1. Stop taking pantoprazole (Protonix). 2. I have sent in omeprazole (Prilosec) 40 mg.  Take this once a day on an empty stomach. 3. Call us in 3 to 4 weeks and let us know if it is helping your heartburn any better.  Further medication recommendations can be made at that time.  Constipation: 1. Stop taking MiraLAX for now 2. I am giving you samples of Linzess 72 mcg.  Take this once a day, on an empty stomach 3. Call us in 1 to 2 weeks and let us know if it is helping her constipation.  Further medication recommendations can be made at that time.  Overall I recommend:  1. Follow-up in 4 months 2. Call us if you have any questions or concerns.  At [facility] we value your feedback. You may receive a survey about your visit today. Please share your experience as we strive to create trusting relationships with our patients to provide genuine, compassionate, quality care.  We appreciate your understanding and patience as we review any laboratory studies, imaging, and other diagnostic tests that are ordered as we care for you. Our office policy is 5 business days for review of these results, and any emergent or urgent results are addressed in a timely manner for your best interest. If you do not hear from our office in 1 week, please contact us.   We also encourage the use of MyChart, which contains your medical information for your review as well. If you are not enrolled in this feature, an access code is on this after visit summary for your convenience. Thank you for allowing [place] to be involved in your care.  It was great to see you today!  I hope you have a great day!!

## 2018-06-19 NOTE — Assessment & Plan Note (Signed)
Noted chronic GERD symptoms.  She has been on pantoprazole for "a number of years."  She started having worsening reflux in the past 6 weeks.  She has never tried any other PPI.  At this point I have her stop Protonix and start omeprazole 40 mg once daily.  She is to call us with a progress report in 3 to 4 weeks and let us know if it is helping.  Follow-up in 4 months.  Further medication titration can be accomplished over the phone based on her clinical response.

## 2018-06-19 NOTE — Assessment & Plan Note (Signed)
History of hepatitis C with remote positive RNA and subsequent undetectable RNA.  Query spontaneous clearance.  Recommended previously that she undergo a few spot checks for hepatitis C RNA.  This is been a couple years now.  At this point I will check an HCV RNA to confirm undetectable status.  Follow-up in 4 months.

## 2018-06-19 NOTE — Assessment & Plan Note (Signed)
The patient notes a longstanding, chronic history of constipation.  No overt abdominal pain.  Consistent most likely with CIC although IBS-C cannot be excluded.  Colonoscopy recently completed in March 2019 in [place], [place] which the patient provided a copy of the report and was marked for scanning into our system.  At this point she takes MiraLAX which works somewhat but not effectively.  I will have her hold MiraLAX and start Linzess 72 mcg.  I will provide samples for 1 to 2 weeks and request a progress report in 1 to 2 weeks.  We will request colonoscopy surgical pathology for our records.  Follow-up in 4 months.

## 2018-06-19 NOTE — Progress Notes (Signed)
Primary Care Physician:  Scherrie Bateman Primary Gastroenterologist:  Dr. Gala Romney  Chief Complaint  Patient presents with  . Gastroesophageal Reflux    HPI:   Sabrina Beasley is a 58 y.o. female who presents for follow-up on reflux.  The patient was last seen in our office 07/07/2016 for history of hepatitis C.  At that time it was noted she was overdue for colonoscopy.  History of hepatitis C positive antibody and undetectable RNA.  In October 2010 she was jaundiced with a bilirubin of 9.9 and direct bilirubin 6.9, alk phos 218, AST 1720, ALT 1898.  HIV negative.  Previous records from Dr. Windy Fast in 2010 suspected drug-induced hepatitis from simvastatin and fenofibrate rather than acute hepatitis C.  Previous EGD 2014 with mild chronic gastritis and esophagitis, no H. pylori.  Colonoscopy in September 2013 found colon polyps, multiple 4 to 6 mm polyps which were a mix of hyperplastic and adenomatous.  Hepatitis C RNA rechecked at [facility] which was low and detectable at 43 and genotype could not be obtained due to this.  Weakly positive ANA, negative smooth muscle antibody, copper 1.44, ceruloplasmin 35.  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  Recommended follow-up labs and abdominal ultrasound.  Labs were completed which found normal WBC count, normal kidney and liver function.  Hepatitis C RNA not detected, immune to hepatitis B likely due to previous vaccination.  Apparent spontaneous clearance of hepatitis C by the patient's immune system.  Recommended recheck HCVRNA quantitative every 3 to 6 months over the course of the year.  Recommended she follow through with her ultrasound.  Recommended need for colonoscopy but deferred until after ultrasound.  It does not appear that ultrasound was completed.  The  patient canceled a follow-up visit and no showed a second follow-up visit.  The patient provided a recent colonoscopy report dated 07/29/2017 by a [facility] in [place], [place].  Findings included multiple colon polyps ranging from 3 to 6 mm.  There appears to have been a total of 6 polyps.  Inadequate prep with thick liquid stool on the right colon and right colonic views were inadequate.  Recommended colonoscopy in 3 to 5 years pending biopsy report.  No surgical pathology report was provided.  Today she states she's doing ok overall. Has chronic GERD. Over the past 6 weeks has gotten progressively worse. She has been on Protonix for "many years." Symptoms include minimal epigastric pain, esophageal/throat burning, bitter taste. 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 No major residual deficits. Has frequent constipation with rare/intermittent scant toilet tissue hematochezia. Takes MiraLAX which works somewhat, but not great. Has had constipation for many years. Is wanting better constipation treatment. Denies ongoing abdominal pain, N/V, melena, fever, chills, unintentional weight loss. Denies chest pain, dyspnea, dizziness, lightheadedness, syncope, near syncope. Denies any other upper or lower GI symptoms.  Past Medical History:  Diagnosis Date  . Anxiety   . Bleeding in brain due to brain aneurysm ([place])   . Bulging of cervical intervertebral disc    C4-5 &  C6-7  . Constipation   . DDD (degenerative disc disease), cervical    stenosis  . Depression   . Family history of premature CAD   . GERD (gastroesophageal reflux disease)   . Gout   . History of endometriosis   .  History of kidney stones     right side w associated n/v  . History of ovarian cyst   . History of rheumatic fever   . Hx of hepatitis C dx 04-16-2009 liver bx   mild portal/ periportal fibrosis-- lower serum level detection positive antibody virus RNA  . Hyperlipidemia   . OA  (osteoarthritis)   . Osteoporosis   . PONV (postoperative nausea and vomiting)    pt states scopalomine patches help  . Pre-diabetes   . Right ureteral stone     Past Surgical History:  Procedure Laterality Date  . CARDIOVASCULAR STRESS TEST  06/19/2015  dr Bronson Ing   normal nuclear study, low risk w/ overall LVSF normal and wall motion normal , stress ef 57%  . CARPAL TUNNEL RELEASE Right x2  last one  . CARPAL TUNNEL RELEASE Left 02/23/2013   Procedure: CARPAL TUNNEL RELEASE;  Surgeon: Carole Civil, MD;  Location: [facility];  Service: Orthopedics;  Laterality: Left;  . CHOLECYSTECTOMY  1999  . COLONOSCOPY  01/2012   Dr. Posey Pronto: tortous colon, multiple colon polyps, 2 cauterized in ascending colon, one transverse colon adenomatous, sigmoid polyps (6) hyperplastic.   Marland Kitchen CYSTOSCOPY W/ RETROGRADES Right 05/07/2016   Procedure: CYSTOSCOPY WITH RETROGRADE PYELOGRAM;  Surgeon: Festus Aloe, MD;  Location[facility];  Service: Urology;  Laterality: Right;  . DX LAPAROSCOPY/  D & C HYSTEROSCOPY  2001  . ESOPHAGOGASTRODUODENOSCOPY  05/2012   Dr. Posey Pronto: esophagitis, gastritis, no hpylori  . LAPAROSCOPIC SALPINGO OOPHERECTOMY Right 08/24/2006  . RECTOCELE REPAIR N/A 01/13/2016   Procedure: POSTERIOR VAGINAL REPAIR (RECTOCELE);  Surgeon: Jonnie Kind, MD;  Location: [facility];  Service: Gynecology;  Laterality: N/A;  . TOTAL ABDOMINAL HYSTERECTOMY Left 05/22/2002   w/  Left Salpingo Oopherectomy  . TRANSTHORACIC ECHOCARDIOGRAM  06/19/2015   ef 60-65%/  trivial MR and PR  . TUBAL LIGATION  1989    Current Outpatient Medications  Medication Sig Dispense Refill  . butalbital-acetaminophen-caffeine (FIORICET, ESGIC) 50-325-40 MG tablet Take 1 tablet by mouth as needed.    . clonazePAM (KLONOPIN) 0.5 MG tablet Take 0.5 tablets by mouth as needed.    . DULoxetine (CYMBALTA) 60 MG capsule TK ONE C PO  D  1  . fenofibrate (TRICOR) 145 MG tablet Take 145 mg by mouth daily.      . hydrochlorothiazide (MICROZIDE) 12.5 MG capsule Take 12.5 mg by mouth daily.    . hydrOXYzine (VISTARIL) 25 MG capsule Take 1 capsule by mouth at bedtime.    Marland Kitchen levocetirizine (XYZAL) 5 MG tablet Take 5 mg by mouth every evening.    Marland Kitchen lisinopril (PRINIVIL,ZESTRIL) 40 MG tablet Take 1 tablet by mouth daily.    . mirtazapine (REMERON) 15 MG tablet     . pantoprazole (PROTONIX) 40 MG tablet Take 1 tablet by mouth daily.    . polyethylene glycol powder (GLYCOLAX/MIRALAX) powder daily.     . QUEtiapine (SEROQUEL) 50 MG tablet Take 50 mg by mouth at bedtime.    . simvastatin (ZOCOR) 40 MG tablet Take 40 mg by mouth at bedtime.      . traZODone (DESYREL) 150 MG tablet Take 150 mg by mouth at bedtime.      Marland Kitchen VITAMIN D PO Take by mouth daily.     No current facility-administered medications for this visit.     Allergies as of 06/19/2018 - Review Complete 06/19/2018  Allergen Reaction Noted  . Nitrofurantoin monohyd macro Nausea And Vomiting 11/03/2010  .  Sulfa antibiotics Rash 06/12/2015    Family History  Problem Relation Age of Onset  . Hypertension Mother   . Hyperlipidemia Mother   . Heart disease Father   . Heart disease Brother        passed age 73 from heart valve complication of some sort  . Colon polyps Brother   . Colon polyps Brother        deceased age 48 from "unknown"  . Colon cancer Neg Hx     Social History   Socioeconomic History  . Marital status: Married    Spouse name: Not on file  . Number of children: Not on file  . Years of education: Not on file  . Highest education level: Not on file  Occupational History  . Not on file  Social Needs  . Financial resource strain: Not on file  . Food insecurity:    Worry: Not on file    Inability: Not on file  . Transportation needs:    Medical: Not on file    Non-medical: Not on file  Tobacco Use  . Smoking status: Former Smoker    Packs/day: 0.50    Years: 10.00    Pack years: 5.00    Types: Cigarettes     Start date: 05/10/1978  . Smokeless tobacco: Never Used  Substance and Sexual Activity  . Alcohol use: No    Alcohol/week: 0.0 standard drinks  . Drug use: No  . Sexual activity: Not on file  Lifestyle  . Physical activity:    Days per week: Not on file    Minutes per session: Not on file  . Stress: Not on file  Relationships  . Social connections:    Talks on phone: Not on file    Gets together: Not on file    Attends religious service: Not on file    Active member of club or organization: Not on file    Attends meetings of clubs or organizations: Not on file    Relationship status: Not on file  . Intimate partner violence:    Fear of current or ex partner: Not on file    Emotionally abused: Not on file    Physically abused: Not on file    Forced sexual activity: Not on file  Other Topics Concern  . Not on file  Social History Narrative  . Not on file    Review of Systems: General: Negative for anorexia, weight loss, fever, chills, fatigue, weakness. ENT: Negative for hoarseness, difficulty swallowing. CV: Negative for chest pain, angina, palpitations, peripheral edema.  Respiratory: Negative for dyspnea at rest, cough, sputum, wheezing.  GI: See history of present illness. Endo: Negative for unusual weight change.  Heme: Negative for bruising or bleeding. Allergy: Negative for rash or hives.    Physical Exam: BP 125/74   Pulse 64   Temp (!) 97.4 F (36.3 C) (Oral)   Ht '4\' 11"'$  (1.499 m)   Wt 153 lb 12.8 oz (69.8 kg)   BMI 31.06 kg/m  General:   Alert and oriented. Pleasant and cooperative. Well-nourished and well-developed.  Eyes:  Without icterus, sclera clear and conjunctiva pink.  Ears:  Normal auditory acuity. Cardiovascular:  S1, S2 present without murmurs appreciated. Extremities without clubbing or edema. Respiratory:  Clear to auscultation bilaterally. No wheezes, rales, or rhonchi. No distress.  Gastrointestinal:  +BS, soft, non-tender and  non-distended. No HSM noted. No guarding or rebound. No masses appreciated.  Rectal:  Deferred  Musculoskalatal:  Symmetrical  without gross deformities. Neurologic:  Alert and oriented x4;  grossly normal neurologically. Psych:  Alert and cooperative. Normal mood and affect. Heme/Lymph/Immune: No excessive bruising noted.    06/19/2018 12:06 PM   Disclaimer: This note was dictated with voice recognition software. Similar sounding words can inadvertently be transcribed and may not be corrected upon review.

## 2018-06-20 NOTE — Progress Notes (Signed)
CC'D TO PCP °

## 2018-06-22 ENCOUNTER — Telehealth: Payer: Self-pay | Admitting: Internal Medicine

## 2018-06-22 NOTE — Telephone Encounter (Signed)
Pt was calling to see if her lab results were available. [phone]

## 2018-06-22 NOTE — Telephone Encounter (Signed)
Spoke with pt, she [facility]. Labs haven't resulted. Will call lab to check on results.

## 2018-06-23 ENCOUNTER — Telehealth: Payer: Self-pay | Admitting: Internal Medicine

## 2018-06-23 LAB — HCV RNA, QUANT REAL-TIME PCR W/REFLEX
HCV RNA, PCR, QN (Log): <1.18 LogIU/mL
HCV RNA, PCR, QN: <15 IU/mL

## 2018-06-23 NOTE — Telephone Encounter (Signed)
[phone]  Patient called inquiring about her labwork

## 2018-06-26 NOTE — Telephone Encounter (Signed)
Pt is aware that this lab test had to be sent out and it takes longer to receive.

## 2018-06-28 ENCOUNTER — Telehealth: Payer: Self-pay | Admitting: Internal Medicine

## 2018-06-28 DIAGNOSIS — K5900 Constipation, unspecified: Secondary | ICD-10-CM

## 2018-06-28 DIAGNOSIS — K219 Gastro-esophageal reflux disease without esophagitis: Secondary | ICD-10-CM

## 2018-06-28 NOTE — Telephone Encounter (Signed)
Pt said she had labs done a week ago Monday and was calling to see if results were available. Please call [phone]

## 2018-06-28 NOTE — Telephone Encounter (Signed)
Pt is inquiring about lab results 

## 2018-06-29 ENCOUNTER — Other Ambulatory Visit (HOSPITAL_COMMUNITY): Payer: Self-pay | Admitting: Family Medicine

## 2018-06-29 DIAGNOSIS — Z1231 Encounter for screening mammogram for malignant neoplasm of breast: Secondary | ICD-10-CM

## 2018-06-30 NOTE — Telephone Encounter (Signed)
Results sent to MyChart. See result note for details.

## 2018-07-03 NOTE — Telephone Encounter (Signed)
Pt notified of results. Pt also wanted to mention that the Omperazole 40 mg isn't helping her. She is still having bad reflux. She is taking 40 mg before her first meal and continues to belch and have fluid come back up. Linzess 72 mcg is taking in the mornings as well and pt has severe diarrhea with it.

## 2018-07-04 ENCOUNTER — Telehealth: Payer: Self-pay | Admitting: Internal Medicine

## 2018-07-04 NOTE — Telephone Encounter (Signed)
EG pt doesn't live in [place] and wants to know if you can call her in a 30 day supple of Dexilant to try. She also said she can't take Amitiza. She tried it years ago and it causes diarrhea as well. Miralax doesn't help pt if taken alone. Pt hasn't tried Movantik .

## 2018-07-04 NOTE — Telephone Encounter (Signed)
See other phone note for documentation.  

## 2018-07-04 NOTE — Telephone Encounter (Signed)
Have her come by the office for samples of Dexilant 60 mg daily. Call with progress report in 1-2 weeks.  Lets also have her stop Linzess and pick up samples of Amitiza 8 mcg twice daily (on a FULL stomach). Call with progress report in 1-2 weeks.

## 2018-07-04 NOTE — Telephone Encounter (Signed)
Pt called again to follow up from yesterday's question about her medication. I told her that AM was with another patient and I read to her what EG had addressed about stopping Linzess and to try dexilant and amitiza samples first and then call in 1-2 weeks for a report. Please advise if we have the samples for her to pick up. She lives in [place] and didn't want to waste a trip if we didn't have any samples. Please advise [phone]

## 2018-07-07 ENCOUNTER — Telehealth: Payer: Self-pay | Admitting: Internal Medicine

## 2018-07-07 MED ORDER — DEXLANSOPRAZOLE 60 MG PO CPDR: 60.0000 mg | DELAYED_RELEASE_CAPSULE | Freq: Every day | ORAL | 0 refills | Status: DC

## 2018-07-07 NOTE — Telephone Encounter (Signed)
Gerrard

## 2018-07-07 NOTE — Addendum Note (Signed)
Addended by: Gordy Levan, Edlyn Rosenburg A on: 07/07/2018 03:13 PM   Modules accepted: Orders

## 2018-07-07 NOTE — Telephone Encounter (Addendum)
Rx sent.  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 Has she tried Trulance or Motegrity?  Call with 1 month progress report on Dexilant effectiveness.

## 2018-07-10 NOTE — Telephone Encounter (Signed)
Spoke with pt. She is aware Dexilant was sent to her pharmacy. Pt will call back with a progress report in 1 month. Pt hasn't tried Trulance or Motegrity and is ok with trying one of them.

## 2018-07-12 ENCOUNTER — Ambulatory Visit (HOSPITAL_COMMUNITY): Payer: Medicare Other

## 2018-07-12 NOTE — Telephone Encounter (Signed)
Note sent to nurse. 

## 2018-07-13 MED ORDER — PLECANATIDE 3 MG PO TABS: 3.0000 mg | ORAL_TABLET | Freq: Every day | ORAL | 3 refills | Status: DC

## 2018-07-13 NOTE — Telephone Encounter (Signed)
Rx sent to pharmacy   

## 2018-07-13 NOTE — Telephone Encounter (Signed)
Sabrina Beasley, Pt is aware that medication was sent to her pharmacy.

## 2018-07-13 NOTE — Telephone Encounter (Signed)
EG pt would like RX Trulance sent to her pharmacy. She said her household has the flu and she can't come to the office for samples.

## 2018-07-13 NOTE — Telephone Encounter (Signed)
Lets leave 1 week (1 box) of Trulance at front desk for her to pick up. Call with progress report in 1-2 weeks and let us know if it has helped.

## 2018-07-13 NOTE — Addendum Note (Signed)
Addended by: Gordy Levan, Aviraj Kentner A on: 07/13/2018 04:12 PM   Modules accepted: Orders

## 2018-07-14 NOTE — Telephone Encounter (Signed)
Pt left several VM stating her pharmacy didn't have her RX. Trulance needed a PA. PA was submitted with urgent review on [email or web address]. PA for Trulance has been approved through 05/10/2019. Pt was notified of approval. Approval letter was scanned.

## 2018-07-19 ENCOUNTER — Encounter (HOSPITAL_COMMUNITY): Payer: Self-pay

## 2018-07-19 ENCOUNTER — Other Ambulatory Visit: Payer: Self-pay

## 2018-07-19 ENCOUNTER — Ambulatory Visit (HOSPITAL_COMMUNITY)
Admission: RE | Admit: 2018-07-19 | Discharge: 2018-07-19 | Disposition: A | Discharge status: Home / Self Care | Payer: Medicare Other | Source: Ambulatory Visit | Attending: Family Medicine | Admitting: Family Medicine

## 2018-07-19 DIAGNOSIS — Z1231 Encounter for screening mammogram for malignant neoplasm of breast: Secondary | ICD-10-CM | POA: Insufficient documentation

## 2018-07-28 ENCOUNTER — Other Ambulatory Visit: Payer: Self-pay | Admitting: Nurse Practitioner

## 2018-07-28 DIAGNOSIS — K219 Gastro-esophageal reflux disease without esophagitis: Secondary | ICD-10-CM

## 2018-08-29 DIAGNOSIS — I619 Nontraumatic intracerebral hemorrhage, unspecified: Secondary | ICD-10-CM | POA: Diagnosis not present

## 2018-08-29 DIAGNOSIS — I10 Essential (primary) hypertension: Secondary | ICD-10-CM | POA: Diagnosis not present

## 2018-08-29 DIAGNOSIS — E785 Hyperlipidemia, unspecified: Secondary | ICD-10-CM | POA: Diagnosis not present

## 2018-08-29 DIAGNOSIS — K219 Gastro-esophageal reflux disease without esophagitis: Secondary | ICD-10-CM | POA: Diagnosis not present

## 2018-10-19 ENCOUNTER — Ambulatory Visit: Payer: Medicare Other | Admitting: Nurse Practitioner

## 2018-10-19 DIAGNOSIS — K219 Gastro-esophageal reflux disease without esophagitis: Secondary | ICD-10-CM | POA: Diagnosis not present

## 2018-10-19 DIAGNOSIS — E785 Hyperlipidemia, unspecified: Secondary | ICD-10-CM | POA: Diagnosis not present

## 2018-10-19 DIAGNOSIS — I10 Essential (primary) hypertension: Secondary | ICD-10-CM | POA: Diagnosis not present

## 2018-11-20 ENCOUNTER — Other Ambulatory Visit: Payer: Self-pay | Admitting: Neurosurgery

## 2018-11-20 DIAGNOSIS — I607 Nontraumatic subarachnoid hemorrhage from unspecified intracranial artery: Secondary | ICD-10-CM

## 2019-02-10 ENCOUNTER — Other Ambulatory Visit: Payer: Self-pay

## 2019-02-10 ENCOUNTER — Ambulatory Visit
Admission: RE | Admit: 2019-02-10 | Discharge: 2019-02-10 | Disposition: A | Discharge status: Home / Self Care | Payer: Medicare Other | Source: Ambulatory Visit | Attending: Neurosurgery | Admitting: Neurosurgery

## 2019-02-10 DIAGNOSIS — I607 Nontraumatic subarachnoid hemorrhage from unspecified intracranial artery: Secondary | ICD-10-CM

## 2019-04-17 ENCOUNTER — Ambulatory Visit: Payer: 59 | Admitting: Podiatry

## 2019-04-20 ENCOUNTER — Ambulatory Visit: Payer: 59 | Admitting: Podiatry

## 2019-05-01 ENCOUNTER — Encounter: Payer: Self-pay | Admitting: Podiatry

## 2019-05-01 ENCOUNTER — Other Ambulatory Visit: Payer: Self-pay

## 2019-05-01 ENCOUNTER — Ambulatory Visit (INDEPENDENT_AMBULATORY_CARE_PROVIDER_SITE_OTHER): Payer: 59 | Admitting: Podiatry

## 2019-05-01 ENCOUNTER — Ambulatory Visit (INDEPENDENT_AMBULATORY_CARE_PROVIDER_SITE_OTHER): Payer: 59

## 2019-05-01 DIAGNOSIS — M722 Plantar fascial fibromatosis: Secondary | ICD-10-CM

## 2019-05-01 DIAGNOSIS — I10 Essential (primary) hypertension: Secondary | ICD-10-CM | POA: Insufficient documentation

## 2019-05-01 DIAGNOSIS — F419 Anxiety disorder, unspecified: Secondary | ICD-10-CM | POA: Insufficient documentation

## 2019-05-01 MED ORDER — METHYLPREDNISOLONE 4 MG PO TBPK: ORAL_TABLET | ORAL | 0 refills | Status: DC

## 2019-05-01 MED ORDER — MELOXICAM 15 MG PO TABS: 15.0000 mg | ORAL_TABLET | Freq: Every day | ORAL | 3 refills | Status: DC

## 2019-05-01 NOTE — Patient Instructions (Signed)

## 2019-05-01 NOTE — Progress Notes (Signed)
She presents today for a follow-up of her heel pain bilaterally.  She states is been aching for the past few months I think my heel spurs are back.  She has had an endoscopic fasciotomy on the right foot but states that it seems to be in a different place.  She is having some anterior ankle pain and swelling.  She is also taking ibuprofen.  Objective: Vital signs are stable she is alert and oriented x3 she has pain on palpation medial calcaneal tubercle of the bilateral heels.  Assessment: Plantar fasciitis bilateral.  Plan: Started her on methylprednisolone to be followed by meloxicam.  Injected her bilateral heels today 20 mg Kenalog 5 mg of Marcaine and placed her in plantar fascial brace.  I will follow-up with her in 1 month.

## 2019-05-08 NOTE — Telephone Encounter (Signed)
New PA form was submitted through [email or web address] for Trulance. Waiting on an approval or denial.

## 2019-06-05 ENCOUNTER — Other Ambulatory Visit: Payer: Self-pay

## 2019-06-05 ENCOUNTER — Ambulatory Visit (INDEPENDENT_AMBULATORY_CARE_PROVIDER_SITE_OTHER): Payer: 59 | Admitting: Podiatry

## 2019-06-05 DIAGNOSIS — S93691D Other sprain of right foot, subsequent encounter: Secondary | ICD-10-CM

## 2019-06-05 DIAGNOSIS — S93492D Sprain of other ligament of left ankle, subsequent encounter: Secondary | ICD-10-CM
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
## 2019-06-06 ENCOUNTER — Telehealth: Payer: Self-pay | Admitting: *Deleted

## 2019-06-06 DIAGNOSIS — S93691A Other sprain of right foot, initial encounter: Secondary | ICD-10-CM

## 2019-06-06 DIAGNOSIS — M722 Plantar fascial fibromatosis: Secondary | ICD-10-CM

## 2019-06-06 DIAGNOSIS — S93691D Other sprain of right foot, subsequent encounter: Secondary | ICD-10-CM

## 2019-06-06 DIAGNOSIS — S93491D Sprain of other ligament of right ankle, subsequent encounter: Secondary | ICD-10-CM

## 2019-06-06 DIAGNOSIS — S93492D Sprain of other ligament of left ankle, subsequent encounter: Secondary | ICD-10-CM

## 2019-06-06 NOTE — Telephone Encounter (Signed)
-----   Message from Rip Harbour, [facility] sent at 06/05/2019 12:00 PM EST ----- Regarding: MRI MRI - ankle bilateral - evaluate plantar fascial tear right and ATFL tear with history of trauma left - surgical consideration

## 2019-06-06 NOTE — Telephone Encounter (Signed)
[place] is not required for the requested services  MRI ankle bilateral -   Dx: Rupture plantar fascial tear right [id number]        Anterior talofibular ligament tear [id number]  Decision ID: [id number]

## 2019-06-06 NOTE — Telephone Encounter (Signed)
Orders to A. Prevette, CMA for pre-cert, faxed to [place].

## 2019-06-06 NOTE — Telephone Encounter (Signed)
[facility] - MEDICAID NOTIFICATION STATES, "THIS [place] MEDICAID MEMBER DOES NOT REQUIRE PRIOR AUTHORIZATION FOR OUTPATIENT RADIOLOGY THROUGH [facility] OR [place] [facility] AT THIS TIME." 

## 2019-06-06 NOTE — Telephone Encounter (Signed)
Faxed orders with authorization determination to [place].

## 2019-06-16 ENCOUNTER — Ambulatory Visit
Admission: RE | Admit: 2019-06-16 | Discharge: 2019-06-16 | Disposition: A | Discharge status: Home / Self Care | Payer: 59 | Source: Ambulatory Visit | Attending: Podiatry | Admitting: Podiatry

## 2019-06-16 ENCOUNTER — Other Ambulatory Visit: Payer: Self-pay

## 2019-06-16 DIAGNOSIS — M722 Plantar fascial fibromatosis: Secondary | ICD-10-CM

## 2019-06-16 DIAGNOSIS — S93491D Sprain of other ligament of right ankle, subsequent encounter: Secondary | ICD-10-CM

## 2019-06-16 DIAGNOSIS — S93691A Other sprain of right foot, initial encounter: Secondary | ICD-10-CM

## 2019-06-16 DIAGNOSIS — S93691D Other sprain of right foot, subsequent encounter: Secondary | ICD-10-CM

## 2019-07-03 ENCOUNTER — Telehealth: Payer: Self-pay | Admitting: *Deleted

## 2019-07-03 NOTE — Telephone Encounter (Signed)
Left message informing pt Dr. Milinda Pointer was sending the MRI disc copy to a radiology specialist for more information for treatment planning, to schedule with our Dr. Amalia Hailey for 10 to 14 days from today, and to call with concerns. Mailed copy of MRI disc to [facility].

## 2019-07-03 NOTE — Telephone Encounter (Signed)
Left message requesting pt call to discuss results and referral to one of our physician for review and possible consultation.

## 2019-07-03 NOTE — Telephone Encounter (Signed)
-----   Message from Garrel Ridgel, [place] sent at 06/19/2019  7:34 AM EST ----- Have her in to see Dr. Anabel Halon he thinks it needs surgery.

## 2019-07-03 NOTE — Telephone Encounter (Signed)
Pt called and I informed that Dr. Milinda Pointer had reviewed the MRI and felt she would benefit from being evaluated by Dr. Amalia Hailey one of our ankle specialist. Pt states she would like to keep her appt with Dr. Milinda Pointer to discuss the heel spurs that are worsening. I agreed and Dr. Milinda Pointer will see her Thursday.

## 2019-07-03 NOTE — Telephone Encounter (Signed)
-----   Message from Garrel Ridgel, [place] sent at 06/19/2019  7:29 AM EST ----- Requst over read please.

## 2019-07-05 ENCOUNTER — Other Ambulatory Visit: Payer: Self-pay

## 2019-07-05 ENCOUNTER — Ambulatory Visit (INDEPENDENT_AMBULATORY_CARE_PROVIDER_SITE_OTHER): Payer: 59 | Admitting: Podiatry

## 2019-07-05 ENCOUNTER — Encounter: Payer: Self-pay | Admitting: Podiatry

## 2019-07-05 DIAGNOSIS — M722 Plantar fascial fibromatosis: Secondary | ICD-10-CM | POA: Diagnosis not present

## 2019-07-05 DIAGNOSIS — M958 Other specified acquired deformities of musculoskeletal system: Secondary | ICD-10-CM

## 2019-07-05 NOTE — Progress Notes (Signed)
She presents today for follow-up of her MRI of the bilateral foot.  And left ankle.  She states that this left ankle is incredibly painful and every time is take a step on it or twisted is exquisitely tender.  She states that her fascia is still painful as well even the one who performed a fasciotomy several years ago is painful nail.  Objective: Vital signs are stable alert and oriented x3 severe pain to palpation medial calcaneal tubercle she also has pain on palpation at the ankle joint medially.  MRI does state that there is a fairly large osteochondral defect measuring 1.2 cm at its largest by 0.9 cm with subchondral flattening of the bone and a divot.  It does not demonstrate active plantar fasciitis though patient is very symptomatic for this.  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  Chronic intractable plantar fasciitis bilateral.  Plan: Discussed etiology pathology conservative or surgical therapies with her.  I encouraged her to follow-up with Dr. Amalia Hailey for possible cleanup of her ankle and microfracturing of the OCD.  She would also need to complete for plantar fascial releases bilaterally since she has already had a partial on the right I think a complete would be best for her.  She will follow up with Dr. Amalia Hailey and I will write him a staff message to inform him of our plans.

## 2019-07-16 ENCOUNTER — Ambulatory Visit (INDEPENDENT_AMBULATORY_CARE_PROVIDER_SITE_OTHER): Payer: 59

## 2019-07-16 ENCOUNTER — Ambulatory Visit (INDEPENDENT_AMBULATORY_CARE_PROVIDER_SITE_OTHER): Payer: 59 | Admitting: Podiatry

## 2019-07-16 ENCOUNTER — Other Ambulatory Visit: Payer: Self-pay

## 2019-07-16 DIAGNOSIS — M958 Other specified acquired deformities of musculoskeletal system: Secondary | ICD-10-CM

## 2019-07-16 DIAGNOSIS — M722 Plantar fascial fibromatosis: Secondary | ICD-10-CM | POA: Diagnosis not present

## 2019-07-16 NOTE — Patient Instructions (Signed)
Pre-Operative Instructions  Congratulations, you have decided to take an important step towards improving your quality of life.  You can be assured that the doctors and staff at [facility] will be with you every step of the way.  Here are some important things you should know:  1. Plan to be at the surgery center/hospital at least 1 (one) hour prior to your scheduled time, unless otherwise directed by the surgical center/hospital staff.  You must have a responsible adult accompany you, remain during the surgery and drive you home.  Make sure you have directions to the surgical center/hospital to ensure you arrive on time. 2. If you are having surgery at [facility] or [facility], you will need a copy of your medical history and physical form from your family physician within one month prior to the date of surgery. We will give you a form for your primary physician to complete.  3. We make every effort to accommodate the date you request for surgery.  However, there are times where surgery dates or times have to be moved.  We will contact you as soon as possible if a change in schedule is required.   4. No aspirin/ibuprofen for one week before surgery.  If you are on aspirin, any non-steroidal anti-inflammatory medications (Mobic, Aleve, Ibuprofen) should not be taken seven (7) days prior to your surgery.  You make take Tylenol for pain prior to surgery.  5. Medications - If you are taking daily heart and blood pressure medications, seizure, reflux, allergy, asthma, anxiety, pain or diabetes medications, make sure you notify the surgery center/hospital before the day of surgery so they can tell you which medications you should take or avoid the day of surgery. 6. No food or drink after midnight the night before surgery unless directed otherwise by surgical center/hospital staff. 7. No alcoholic beverages 24-hours prior to surgery.  No smoking 24-hours prior or 24-hours after  surgery. 8. Wear loose pants or shorts. They should be loose enough to fit over bandages, boots, and casts. 9. Don't wear slip-on shoes. Sneakers are preferred. 10. Bring your boot with you to the surgery center/hospital.  Also bring crutches or a walker if your physician has prescribed it for you.  If you do not have this equipment, it will be provided for you after surgery. 11. If you have not been contacted by the surgery center/hospital by the day before your surgery, call to confirm the date and time of your surgery. 12. Leave-time from work may vary depending on the type of surgery you have.  Appropriate arrangements should be made prior to surgery with your employer. 13. Prescriptions will be provided immediately following surgery by your doctor.  Fill these as soon as possible after surgery and take the medication as directed. Pain medications will not be refilled on weekends and must be approved by the doctor. 14. Remove nail polish on the operative foot and avoid getting pedicures prior to surgery. 15. Wash the night before surgery.  The night before surgery wash the foot and leg well with water and the antibacterial soap provided. Be sure to pay special attention to beneath the toenails and in between the toes.  Wash for at least three (3) minutes. Rinse thoroughly with water and dry well with a towel.  Perform this wash unless told not to do so by your physician.  Enclosed: 1 Ice pack (please put in freezer the night before surgery)   1 Hibiclens skin cleaner     Pre-op instructions  If you have any questions regarding the instructions, please do not hesitate to call our office.  [place]: [address], [place], [place] [address] -- [phone]  [place]: [address] [place], [place] [address] -- [phone]  [place]: [address], [place],  [address] -- [phone]   Website: [email or web address] 

## 2019-08-09 ENCOUNTER — Encounter (HOSPITAL_COMMUNITY): Payer: 59

## 2019-08-16 ENCOUNTER — Telehealth: Payer: Self-pay

## 2019-08-16 NOTE — Telephone Encounter (Addendum)
DOS 08/30/19  FASCIECTOMY B/L - 28060 HEEL SPUR RESECTION B/L - 28119 ANKLE ARTHROSCOPY LT - 29898 TALUS LT - 28100 OSTEOTOMY TIBIAL LT - 27705  [employer] MEDICARE EFFECTIVE DATE - 05/11/19  PLAN DEDUCTIBLE - $198.00 W/ $0.00 REMAINING OUT OF POCKET - NO LIMIT COPAY $0.00  COINSURANCE - 20%   Notification or Prior Authorization is not required for the requested services  This [employer] Medicare Advantage members plan does not currently require a prior authorization for these services. If you have general questions about the prior authorization requirements, please call us at [phone] or visit [email or web address] > Clinician Resources > Advance and Admission Notification Requirements. The number above acknowledges your notification. Please write this number down for future reference. Notification is not a guarantee of coverage or payment.  Decision ID [id number]

## 2019-08-20 NOTE — Progress Notes (Signed)
HPI: 59 y.o. female presenting today as a referral from Dr. Milinda Pointer for evaluation of chronic bilateral plantar fasciitis as well as left ankle pain.  MRI was ordered on 06/17/2019 for the left ankle to determine the extent of the osteochondral lesion.  Patient referred to me for surgical consult and to discuss the details and postoperative recovery regarding surgery.  Patient has tried multiple conservative modalities including anti-inflammatory injections and shoe gear modifications without any significant relief.  No new complaints at this time  Past Medical History:  Diagnosis Date  . Anxiety   . Bleeding in brain due to brain aneurysm ([place])   . Bulging of cervical intervertebral disc    C4-5 &  C6-7  . Constipation   . DDD (degenerative disc disease), cervical    stenosis  . Depression   . Family history of premature CAD   . GERD (gastroesophageal reflux disease)   . Gout   . History of endometriosis   . History of kidney stones     right side w associated n/v  . History of ovarian cyst   . History of rheumatic fever   . Hx of hepatitis C dx 04-16-2009 liver bx   mild portal/ periportal fibrosis-- lower serum level detection positive antibody virus RNA  . Hyperlipidemia   . OA (osteoarthritis)   . Osteoporosis   . PONV (postoperative nausea and vomiting)    pt states scopalomine patches help  . Pre-diabetes   . Right ureteral stone      Physical Exam: General: The patient is alert and oriented x3 in no acute distress.  Dermatology: Skin is warm, dry and supple bilateral lower extremities. Negative for open lesions or macerations.  Vascular: Palpable pedal pulses bilaterally. No edema or erythema noted. Capillary refill within normal limits.  Neurological: Epicritic and protective threshold grossly intact bilaterally.   Musculoskeletal Exam: Pain on palpation noted to the medial plantar tubercle of the bilateral heels consistent with plantar fasciitis.  Range of motion  within normal limits to all pedal and ankle joints bilateral. Muscle strength 5/5 in all groups bilateral.   MRI impression 06/17/2019 left ankle:  Large osteochondral lesion of the medial talar dome with mild flattening of the subchondral bone plate, thinning and irregularity of overlying hyaline cartilage and subchondral cyst formation worrisome for fragment instability.  Negative for tendon or ligament tear. Negative for plantar fasciitis with plantar calcaneal spur noted.  Mild degenerative change at the articulation of the middle and medial cuneiforms.    MRI impression 06/16/2019 right ankle: Plantar calcaneal spur without signal abnormality or rupture of the plantar fascia. The exam is otherwise unremarkable.  Assessment: 1.  Chronic plantar fasciitis bilateral 2.  Osteochondral lesion left ankle 3.  H/o plantar fasciitis surgery 2018 right foot   Plan of Care:  1. Patient evaluated.  MRI reviewed.  2.  Today we discussed different surgical options regarding the patient's symptoms.  Conservative modalities have been unsuccessful in providing any sort of satisfactory alleviation of symptoms for the patient.  All possible complications and details the procedure were explained.  No guarantees were expressed or implied.  All patient questions were answered. 3.  Authorization for surgery was initiated today.  Surgery will consist of open plantar fasciotomy bilateral.  Possible plantar heel spur resection bilateral.  Ankle arthroscopy with debridement and osteochondral drilling left ankle.  I did explain to the patient that if insurance does approve osteochondral stem cell graft or allograft this would be the better  outcome.  Due to the placement of the osteochondral lesion open repair of the lesion will provide the patient with the best surgical outcome and be considered medically necessary for repair.  This would also include a tibial osteotomy of the left ankle. 4.  Return to clinic 1  week postop      Edrick Kins, DPM [facility]  Dr. Edrick Kins, DPM    [address], [place] [address]                Office [phone]  Fax [phone]

## 2019-08-30 ENCOUNTER — Other Ambulatory Visit: Payer: Self-pay | Admitting: Podiatry

## 2019-08-30 ENCOUNTER — Telehealth: Payer: Self-pay | Admitting: Podiatry

## 2019-08-30 DIAGNOSIS — S93691D Other sprain of right foot, subsequent encounter: Secondary | ICD-10-CM

## 2019-08-30 DIAGNOSIS — M958 Other specified acquired deformities of musculoskeletal system: Secondary | ICD-10-CM

## 2019-08-30 DIAGNOSIS — M722 Plantar fascial fibromatosis: Secondary | ICD-10-CM | POA: Diagnosis not present

## 2019-08-30 MED ORDER — OXYCODONE-ACETAMINOPHEN 5-325 MG PO TABS: 1.0000 | ORAL_TABLET | Freq: Four times a day (QID) | ORAL | 0 refills | Status: DC | PRN

## 2019-08-30 NOTE — Addendum Note (Signed)
Addended by: Harriett Sine D on: 08/30/2019 03:10 PM   Modules accepted: Orders

## 2019-08-30 NOTE — Progress Notes (Signed)
PRN postop 

## 2019-08-30 NOTE — Telephone Encounter (Addendum)
I spoke with Bennett asked if pt was a veteran and I told her pt's insurance was [employer] and [employer]. Rip Harbour states fax to [employer] [phone]. Orders for knee scooter, clinicals and demographics faxed to [facility].

## 2019-08-30 NOTE — Telephone Encounter (Signed)
Pt called after having surgery on both feet this morning. Says she can put pressure on one foot but not the other. Would like a knee scooter and would like to get that from [facility] in [place] and their phone number is [phone].

## 2019-08-31 ENCOUNTER — Telehealth: Payer: Self-pay | Admitting: Podiatry

## 2019-08-31 ENCOUNTER — Telehealth: Payer: Self-pay | Admitting: *Deleted

## 2019-08-31 NOTE — Telephone Encounter (Signed)
Pt states her foot has bleed through.

## 2019-08-31 NOTE — Telephone Encounter (Signed)
Left message for Dr. Amalia Hailey to call concerning pt's dressing.

## 2019-08-31 NOTE — Telephone Encounter (Addendum)
I spoke with pt and she states she took 1.5 Ibuprofen. I told pt with the fever beginning so close to surgery it may not be related to something else and she should contact her PCP and I would inform Dr. Amalia Hailey for further instructions.

## 2019-08-31 NOTE — Telephone Encounter (Signed)
Patient had surgery yesterday by Dr. Amalia Hailey and has a fever of 102.1.  No other symptoms.  No drainage.  Pt would like a call back.

## 2019-09-03 ENCOUNTER — Telehealth: Payer: Self-pay | Admitting: Podiatry

## 2019-09-03 NOTE — Telephone Encounter (Signed)
Pt. Has been running a low grade fever since surgery.

## 2019-09-05 ENCOUNTER — Other Ambulatory Visit: Payer: Self-pay

## 2019-09-05 ENCOUNTER — Ambulatory Visit (INDEPENDENT_AMBULATORY_CARE_PROVIDER_SITE_OTHER): Payer: 59

## 2019-09-05 ENCOUNTER — Ambulatory Visit (INDEPENDENT_AMBULATORY_CARE_PROVIDER_SITE_OTHER): Payer: 59 | Admitting: Podiatry

## 2019-09-05 ENCOUNTER — Other Ambulatory Visit: Payer: Self-pay | Admitting: Podiatry

## 2019-09-05 DIAGNOSIS — S93492D Sprain of other ligament of left ankle, subsequent encounter: Secondary | ICD-10-CM

## 2019-09-05 DIAGNOSIS — S93691D Other sprain of right foot, subsequent encounter: Secondary | ICD-10-CM

## 2019-09-05 DIAGNOSIS — Z9889 Other specified postprocedural states: Secondary | ICD-10-CM

## 2019-09-07 NOTE — Progress Notes (Signed)
   Subjective:  Patient presents today status post OCD lesion repair with tibial osteotomy left and open plantar fasciotomy bilateral. DOS: 08/30/2019. She reports throbbing pain, left worse than right. She has been taking the pain medication, icing and elevating the feet for treatment. There are no worsening factors noted. Patient is here for further evaluation and treatment.    Past Medical History:  Diagnosis Date  . Anxiety   . Bleeding in brain due to brain aneurysm ([place])   . Bulging of cervical intervertebral disc    C4-5 &  C6-7  . Constipation   . DDD (degenerative disc disease), cervical    stenosis  . Depression   . Family history of premature CAD   . GERD (gastroesophageal reflux disease)   . Gout   . History of endometriosis   . History of kidney stones     right side w associated n/v  . History of ovarian cyst   . History of rheumatic fever   . Hx of hepatitis C dx 04-16-2009 liver bx   mild portal/ periportal fibrosis-- lower serum level detection positive antibody virus RNA  . Hyperlipidemia   . OA (osteoarthritis)   . Osteoporosis   . PONV (postoperative nausea and vomiting)    pt states scopalomine patches help  . Pre-diabetes   . Right ureteral stone       Objective/Physical Exam Neurovascular status intact.  Skin incisions appear to be well coapted with sutures and staples intact. No sign of infectious process noted. No dehiscence. No active bleeding noted. Moderate edema noted to the surgical extremity.  Radiographic Exam:  Orthopedic hardware and osteotomies sites appear to be stable with routine healing.  Assessment: 1. s/p OCD lesion repair with tibial osteotomy left and open plantar fasciotomy bilateral. DOS: 08/30/2019.    Plan of Care:  1. Patient was evaluated. X-rays reviewed 2. Dressing changed.  3. Continue nonweightbearing on LLE.  4. Refill prescription for Percocet 5/325 mg provided to patient.  5. Return to clinic in one week.     Edrick Kins, DPM [facility]  Dr. Edrick Kins, [place]                                        [place], [place] [address]                Office [phone]  Fax [phone]

## 2019-09-12 ENCOUNTER — Ambulatory Visit (INDEPENDENT_AMBULATORY_CARE_PROVIDER_SITE_OTHER): Payer: 59 | Admitting: Podiatry

## 2019-09-12 ENCOUNTER — Other Ambulatory Visit: Payer: Self-pay

## 2019-09-12 VITALS — Temp 97.2°F

## 2019-09-12 DIAGNOSIS — Z9889 Other specified postprocedural states: Secondary | ICD-10-CM

## 2019-09-12 DIAGNOSIS — M958 Other specified acquired deformities of musculoskeletal system: Secondary | ICD-10-CM

## 2019-09-12 DIAGNOSIS — S93691D Other sprain of right foot, subsequent encounter: Secondary | ICD-10-CM

## 2019-09-12 DIAGNOSIS — S93492D Sprain of other ligament of left ankle, subsequent encounter: Secondary | ICD-10-CM

## 2019-09-12 MED ORDER — OXYCODONE-ACETAMINOPHEN 5-325 MG PO TABS: 1.0000 | ORAL_TABLET | Freq: Four times a day (QID) | ORAL | 0 refills | Status: DC | PRN

## 2019-09-18 NOTE — Progress Notes (Signed)
   Subjective:  Patient presents today status post OCD lesion repair with tibial osteotomy left and open plantar fasciotomy bilateral. DOS: 08/30/2019. She states the left foot is doing well and she is not having any pain even without pain medication. She reports some intermittent shooting pain in the right foot that is sometimes severe. She has been using the CAM boot and post op shoe but states the boot puts pressure on the incision site which is uncomfortable. Patient is here for further evaluation and treatment.    Past Medical History:  Diagnosis Date  . Anxiety   . Bleeding in brain due to brain aneurysm ([place])   . Bulging of cervical intervertebral disc    C4-5 &  C6-7  . Constipation   . DDD (degenerative disc disease), cervical    stenosis  . Depression   . Family history of premature CAD   . GERD (gastroesophageal reflux disease)   . Gout   . History of endometriosis   . History of kidney stones     right side w associated n/v  . History of ovarian cyst   . History of rheumatic fever   . Hx of hepatitis C dx 04-16-2009 liver bx   mild portal/ periportal fibrosis-- lower serum level detection positive antibody virus RNA  . Hyperlipidemia   . OA (osteoarthritis)   . Osteoporosis   . PONV (postoperative nausea and vomiting)    pt states scopalomine patches help  . Pre-diabetes   . Right ureteral stone       Objective/Physical Exam Neurovascular status intact.  Skin incisions appear to be well coapted with sutures and staples intact. No sign of infectious process noted. No dehiscence. No active bleeding noted. Moderate edema noted to the surgical extremity.  Assessment: 1. s/p OCD lesion repair with tibial osteotomy left and open plantar fasciotomy bilateral. DOS: 08/30/2019.    Plan of Care:  1. Patient was evaluated. 2. Sutures and staples removed.  3. Continue nonweightbearing in CAM boot on the left lower extremity.  4. Discontinue using post op shoe on right  foot. Recommended good shoe gear.  5. Refill prescription for Percocet 5/325 mg provided to patient.  6. Return to clinic in 2 weeks to begin weightbearing in CAM boot left.    Edrick Kins, DPM [facility]  Dr. Edrick Kins, [place]                                        [place], [place] [address]                Office [phone]  Fax [phone]

## 2019-09-26 ENCOUNTER — Ambulatory Visit (INDEPENDENT_AMBULATORY_CARE_PROVIDER_SITE_OTHER): Payer: 59 | Admitting: Podiatry

## 2019-09-26 ENCOUNTER — Ambulatory Visit (INDEPENDENT_AMBULATORY_CARE_PROVIDER_SITE_OTHER): Payer: 59

## 2019-09-26 ENCOUNTER — Other Ambulatory Visit: Payer: Self-pay

## 2019-09-26 DIAGNOSIS — S93691D Other sprain of right foot, subsequent encounter: Secondary | ICD-10-CM | POA: Diagnosis not present

## 2019-09-26 DIAGNOSIS — S93492D Sprain of other ligament of left ankle, subsequent encounter: Secondary | ICD-10-CM

## 2019-09-26 DIAGNOSIS — Z9889 Other specified postprocedural states: Secondary | ICD-10-CM

## 2019-09-29 NOTE — Progress Notes (Signed)
   Subjective:  Patient presents today status post OCD lesion repair with tibial osteotomy left and open plantar fasciotomy bilateral. DOS: 08/30/2019. She reports some mild intermittent sharp pain that occurs only at night. She reports a decrease in swelling. She has been using the CAM boot as directed. There are no modifying factors noted. Patient is here for further evaluation and treatment.    Past Medical History:  Diagnosis Date  . Anxiety   . Bleeding in brain due to brain aneurysm ([place])   . Bulging of cervical intervertebral disc    C4-5 &  C6-7  . Constipation   . DDD (degenerative disc disease), cervical    stenosis  . Depression   . Family history of premature CAD   . GERD (gastroesophageal reflux disease)   . Gout   . History of endometriosis   . History of kidney stones     right side w associated n/v  . History of ovarian cyst   . History of rheumatic fever   . Hx of hepatitis C dx 04-16-2009 liver bx   mild portal/ periportal fibrosis-- lower serum level detection positive antibody virus RNA  . Hyperlipidemia   . OA (osteoarthritis)   . Osteoporosis   . PONV (postoperative nausea and vomiting)    pt states scopalomine patches help  . Pre-diabetes   . Right ureteral stone       Objective/Physical Exam Neurovascular status intact.  Skin incisions appear to be well coapted. No sign of infectious process noted. No dehiscence. No active bleeding noted. Moderate edema noted to the surgical extremity.  Radiographic Exam:  Orthopedic hardware and osteotomies sites appear to be stable with routine healing.  Assessment: 1. s/p OCD lesion repair with tibial osteotomy left and open plantar fasciotomy bilateral. DOS: 08/30/2019.    Plan of Care:  1. Patient was evaluated. X-Rays reviewed.  2. Begin weightbearing in CAM boot.  3. Begin passive range of motion ankle exercises.  4. Return to clinic in 4 weeks to transition into tennis shoes.    Edrick Kins,  DPM [facility]  Dr. Edrick Kins, [place]                                        [place], [place] [address]                Office [phone]  Fax [phone]

## 2019-10-04 IMAGING — MR MR ANKLE*R* W/O CM
5 series · 40 of 40 positions shown · non-contrast
Comparison: 02/19/2017

CLINICAL DATA: Chronic plantar fasciitis.  Ankle pain.

EXAM:
MRI OF THE RIGHT ANKLE WITHOUT CONTRAST
TECHNIQUE: Multiplanar, multisequence MR imaging of the ankle was performed. No
intravenous contrast was administered.

[Series 3: T2 fat-sat · axial · 3.0mm · 0.56mm/px · z∈[-84,+17]mm · 8 of 27 slices shown (1 of 3)]
[im 1/27]
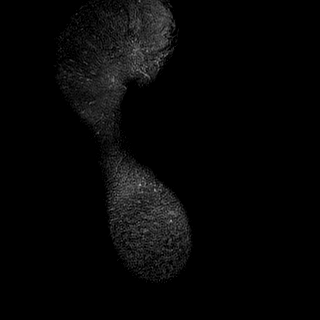
[im 4/27]
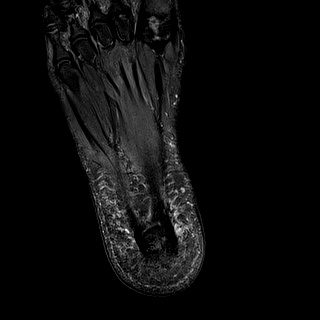
[im 8/27]
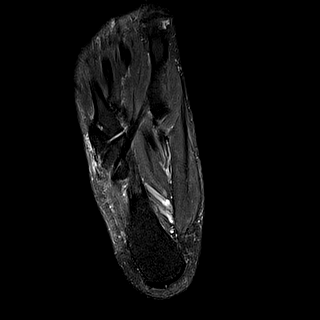
[im 12/27]
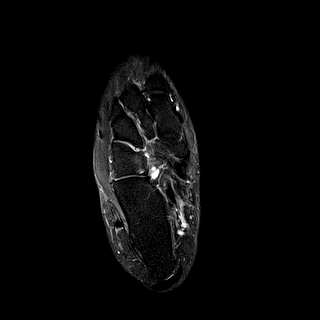
[im 15/27]
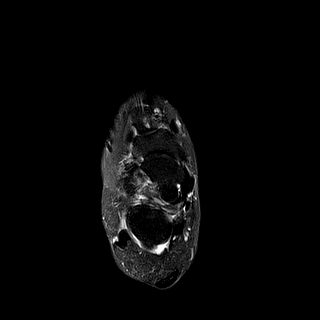
[im 19/27]
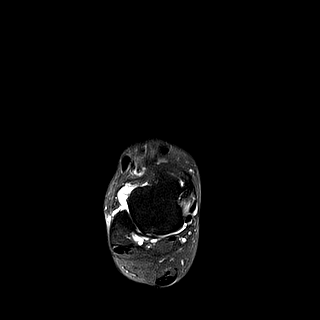
[im 23/27]
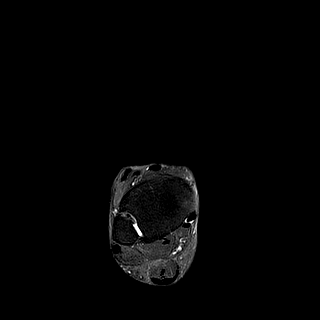
[im 27/27]
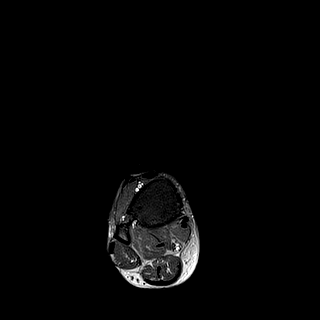

[Series 4: PD fat-sat · axial · 3.0mm · 0.56mm/px · z∈[-84,+17]mm · 9 of 27 slices shown]
[im 1/27]
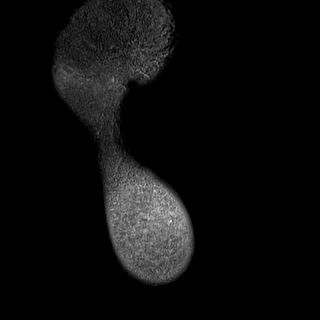
[im 4/27]
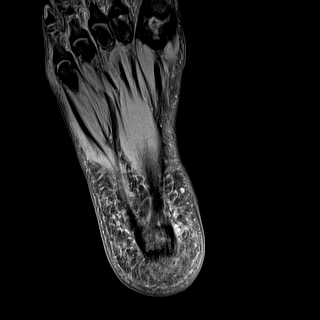
[im 7/27]
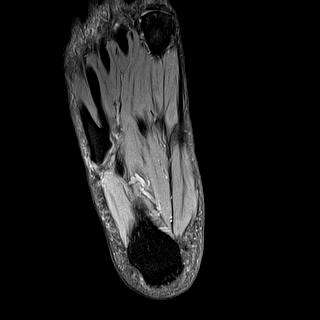
[im 10/27]
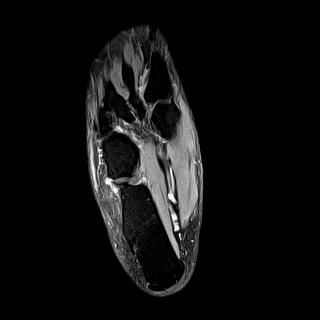
[im 14/27]
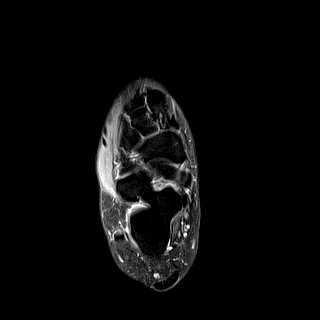
[im 17/27]
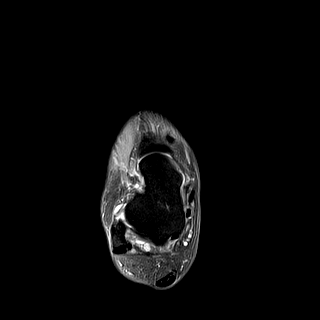
[im 20/27]
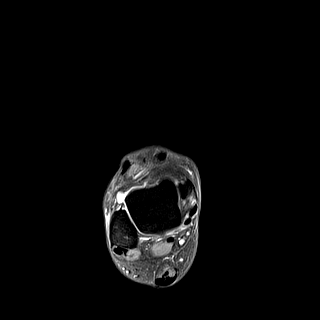
[im 23/27]
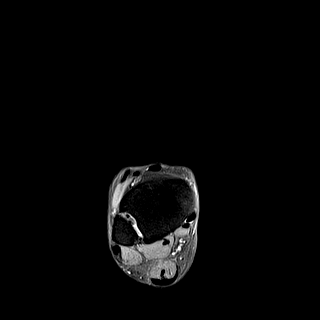
[im 27/27]
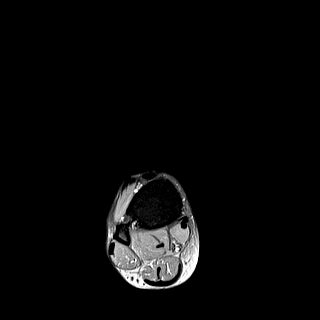

[Series 5: T1 · sagittal · 3.0mm · 0.52mm/px · 7 of 21 slices shown]
[im 1/21]
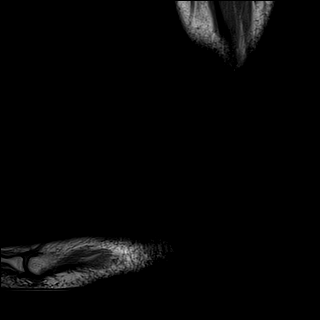
[im 4/21]
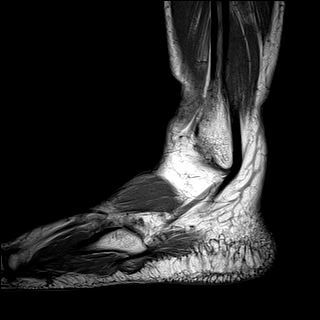
[im 7/21]
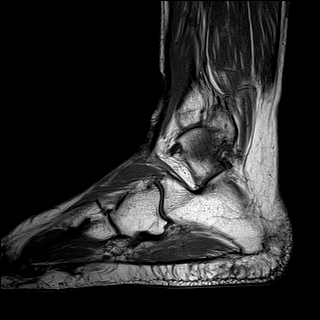
[im 11/21]
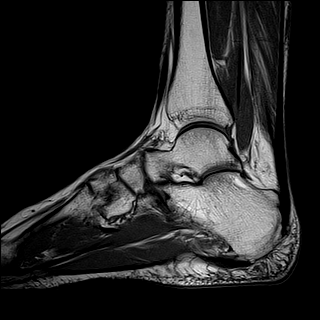
[im 14/21]
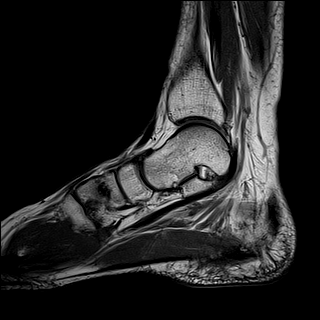
[im 17/21]
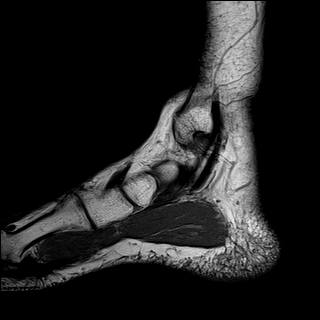
[im 21/21]
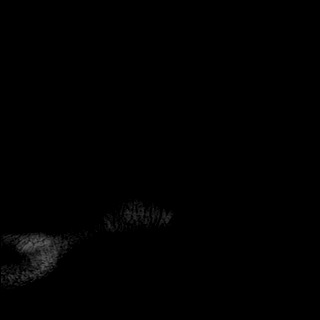

[Series 6: T2 fat-sat · sagittal · 3.0mm · 0.56mm/px · 7 of 21 slices shown (2 of 3)]
[im 1/21]
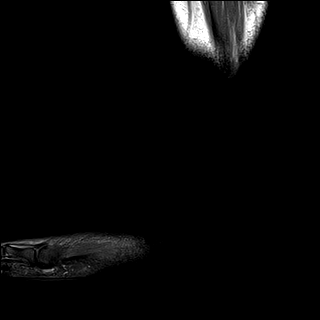
[im 4/21]
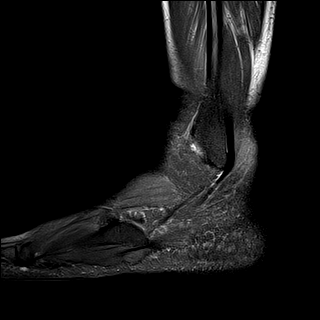
[im 7/21]
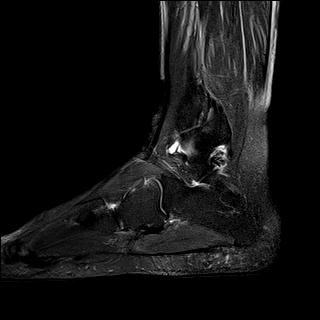
[im 11/21]
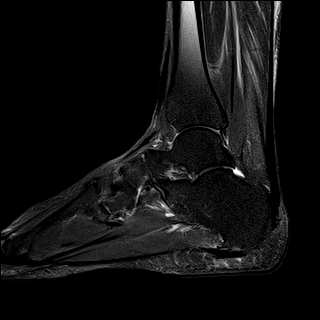
[im 14/21]
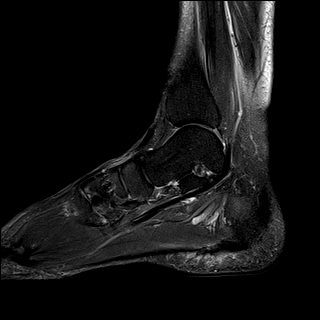
[im 17/21]
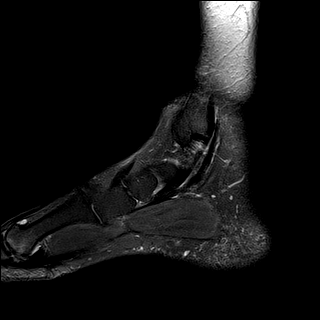
[im 21/21]
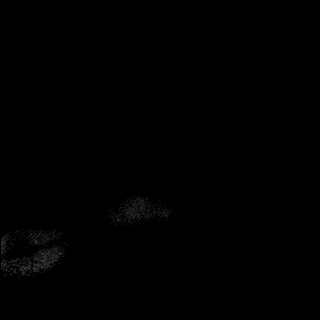

[Series 7: T2 fat-sat · coronal · 3.5mm · 0.56mm/px · 9 of 28 slices shown (3 of 3)]
[im 1/28]
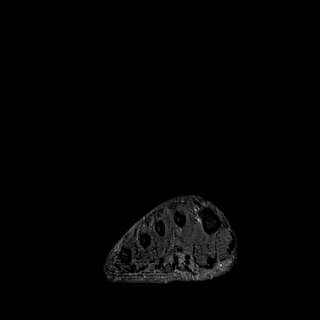
[im 4/28]
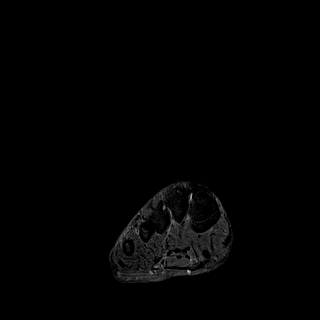
[im 7/28]
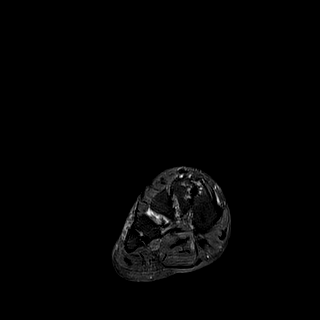
[im 11/28]
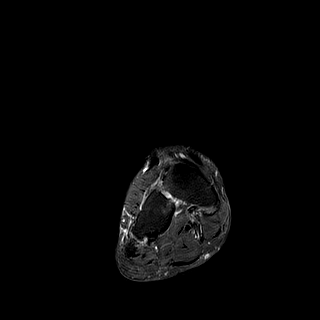
[im 14/28]
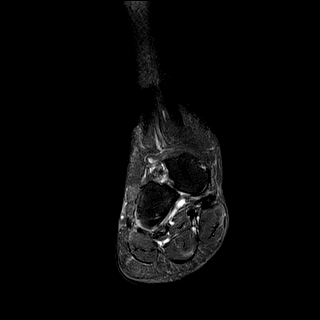
[im 17/28]
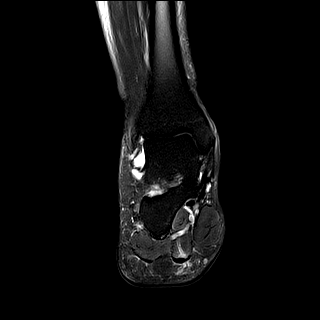
[im 21/28]
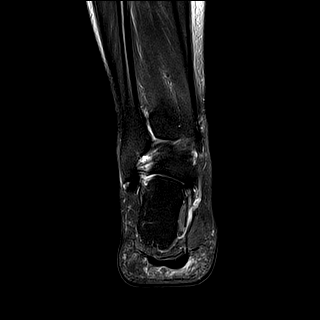
[im 24/28]
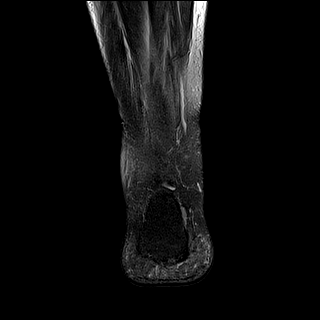
[im 28/28]
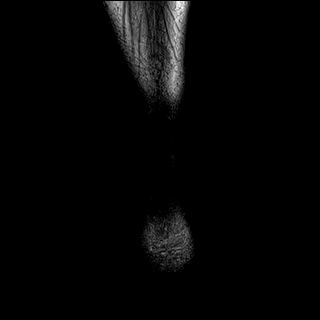

[40 of 40 positions shown; findings below may reference images not displayed]

FINDINGS: TENDONS

Peroneal: Peroneus brevis tendinopathy.

Posteromedial: Tibialis posterior tenosynovitis and distal
tendinopathy.

Anterior: Mild tendinopathy of the extensor digitorum longus on
image [DATE].

Achilles: Unremarkable

Plantar Fascia: Fairly prominent plantar fasciitis with surrounding
edema, abnormal thickening of the medial band up to 8 mm, and
abnormal adjacent edema extending in the flexor digitorum brevis
muscle is shown on image [DATE]. Low-level edema in the adjacent
plantar calcaneal spur. Lesser involvement of the lateral band
plantar fascia.

LIGAMENTS

Lateral: Mildly attenuated anterior talofibular ligament with some
surrounding edema primarily related to joint effusion. Otherwise the
lateral ligamentous complex appears intact.

Medial: Accentuated edema signal along the deep tibiotalar portion
of the deltoid ligament on images 10-11 series a compatible with
sprain. The remainder the deltoid ligament and the spring ligament
appear intact. Lisfranc ligament intact.

CARTILAGE

Ankle Joint: Mild degenerative chondral thinning. Tibiofibular joint
effusion but without a significant talofibular joint effusion.

Subtalar Joints/Sinus Tarsi: Unremarkable

Bones: Small degenerative subcortical cystic lesion in the head of
the first metatarsal seen on image [DATE] with adjacent spurring
degenerative findings at the articulation between the medial and
middle cuneiforms, images 13 through 15 of series 3.

Other: No supplemental non-categorized findings.
IMPRESSION: 1. The dominant finding is the striking plantar fasciitis described
above.
2. Tibialis posterior tenosynovitis and distal tendinopathy,
correlate clinically in assessing for tibialis posterior
dysfunction.
3. Mildly attenuated anterior talofibular ligament, probably from an
old injury. Surrounding fluid is more related to a tibiofibular
joint effusion.
4. Mild peroneus brevis tendinopathy.
5. Extensor digitorum longus tendinopathy anterior to the ankle
joint manifesting as fusiform thickening of the tendon.
6. Suspected sprain of the deep tibiotalar fibers of the deltoid
ligament.
7. Mild degenerative chondral thinning in the tibiotalar joint
without chondral irregularity or focal chondral defect.
8. Degenerative arthropathy at the first MTP joint.

## 2019-10-29 ENCOUNTER — Other Ambulatory Visit: Payer: Self-pay

## 2019-10-29 ENCOUNTER — Ambulatory Visit (INDEPENDENT_AMBULATORY_CARE_PROVIDER_SITE_OTHER): Payer: 59 | Admitting: Podiatry

## 2019-10-29 DIAGNOSIS — Z9889 Other specified postprocedural states: Secondary | ICD-10-CM

## 2019-10-29 DIAGNOSIS — M958 Other specified acquired deformities of musculoskeletal system: Secondary | ICD-10-CM

## 2019-10-29 NOTE — Progress Notes (Signed)
   Subjective:  Patient presents today status post OCD lesion repair with tibial osteotomy left and open plantar fasciotomy bilateral. DOS: 08/30/2019.  Patient states that she is doing much better.  She has no pain except for the fact that the cam boot rubs the incision site to her medial ankle.  She is very satisfied with the surgical outcome so far and has no new complaints at this time.    Past Medical History:  Diagnosis Date  . Anxiety   . Bleeding in brain due to brain aneurysm ([place])   . Bulging of cervical intervertebral disc    C4-5 &  C6-7  . Constipation   . DDD (degenerative disc disease), cervical    stenosis  . Depression   . Family history of premature CAD   . GERD (gastroesophageal reflux disease)   . Gout   . History of endometriosis   . History of kidney stones     right side w associated n/v  . History of ovarian cyst   . History of rheumatic fever   . Hx of hepatitis C dx 04-16-2009 liver bx   mild portal/ periportal fibrosis-- lower serum level detection positive antibody virus RNA  . Hyperlipidemia   . OA (osteoarthritis)   . Osteoporosis   . PONV (postoperative nausea and vomiting)    pt states scopalomine patches help  . Pre-diabetes   . Right ureteral stone       Objective/Physical Exam Neurovascular status intact.  Skin incisions are completely healed.  There is minimal edema noted to the ankle joint.  Negative pain with palpation or range of motion to the ankle joint.  Assessment: 1. s/p OCD lesion repair with tibial osteotomy left and open plantar fasciotomy bilateral. DOS: 08/30/2019.    Plan of Care:  1. Patient was evaluated.  2.  Patient may discontinue the cam boot and transition into good supportive tennis shoes 3.  Continue compression anklet daily as needed edema 4.  Patient may slowly transition into full activity with no restrictions over the next 2-3 weeks 5.  Return to clinic as needed    Edrick Kins, DPM [facility]  Dr. Edrick Kins, [address]                                        [place], [place] [address]                Office [phone]  Fax [phone]

## 2019-12-31 ENCOUNTER — Other Ambulatory Visit: Payer: Self-pay | Admitting: Neurosurgery

## 2019-12-31 DIAGNOSIS — I671 Cerebral aneurysm, nonruptured: Secondary | ICD-10-CM

## 2020-05-14 ENCOUNTER — Other Ambulatory Visit (HOSPITAL_COMMUNITY): Payer: Self-pay | Admitting: Family Medicine

## 2020-05-14 DIAGNOSIS — Z1231 Encounter for screening mammogram for malignant neoplasm of breast: Secondary | ICD-10-CM

## 2020-05-15 ENCOUNTER — Other Ambulatory Visit: Payer: Self-pay

## 2020-05-15 ENCOUNTER — Inpatient Hospital Stay (HOSPITAL_COMMUNITY): Admission: RE | Admit: 2020-05-15 | Payer: 59 | Source: Ambulatory Visit

## 2020-05-15 ENCOUNTER — Ambulatory Visit (HOSPITAL_COMMUNITY)
Admission: RE | Admit: 2020-05-15 | Discharge: 2020-05-15 | Disposition: A | Discharge status: Home / Self Care | Payer: 59 | Source: Ambulatory Visit | Attending: Family Medicine | Admitting: Family Medicine

## 2020-05-15 DIAGNOSIS — Z1231 Encounter for screening mammogram for malignant neoplasm of breast: Secondary | ICD-10-CM

## 2020-05-22 ENCOUNTER — Other Ambulatory Visit (HOSPITAL_COMMUNITY): Payer: Self-pay | Admitting: Family Medicine

## 2020-05-22 DIAGNOSIS — Z13820 Encounter for screening for osteoporosis: Secondary | ICD-10-CM

## 2020-06-05 ENCOUNTER — Ambulatory Visit (HOSPITAL_COMMUNITY)
Admission: RE | Admit: 2020-06-05 | Discharge: 2020-06-05 | Disposition: A | Discharge status: Home / Self Care | Payer: 59 | Source: Ambulatory Visit | Attending: Family Medicine | Admitting: Family Medicine

## 2020-06-05 ENCOUNTER — Other Ambulatory Visit: Payer: Self-pay

## 2020-06-05 DIAGNOSIS — Z13820 Encounter for screening for osteoporosis: Secondary | ICD-10-CM

## 2020-06-05 DIAGNOSIS — M8589 Other specified disorders of bone density and structure, multiple sites: Secondary | ICD-10-CM | POA: Diagnosis not present

## 2020-06-05 DIAGNOSIS — Z780 Asymptomatic menopausal state: Secondary | ICD-10-CM | POA: Insufficient documentation

## 2020-06-17 ENCOUNTER — Encounter: Payer: Self-pay | Admitting: Internal Medicine

## 2020-06-19 NOTE — Progress Notes (Signed)
Urogynecology New Patient Evaluation and Consultation  Referring Provider: Coolidge Breeze, FNP PCP: Quentin Cornwall, MD Date of Service: 06/24/2020  SUBJECTIVE Chief Complaint: New Patient (Initial Visit) (Urgency; pain with holding urine; )  History of Present Illness: Sabrina Beasley is a 60 y.o. White or Caucasian female seen in consultation at the request of Suzzanne Cloud FNP for evaluation of prolapse.    Review of records NP Collins significant for: Has possible prolapse/ cystocele. Has had stress incontinence for many years. She is s/p hysterectomy. Has Type 2 DM with last A1c 5.5%.  Urinary Symptoms: Leaks urine with cough/ sneeze, laughing, with a full bladder and with urgency. UUI >SUI.  Has some pain with holding urine.  Leaks several time(s) per day.  Pad use: none She is bothered by her UI symptoms.  Day time voids 10.  Nocturia: 1 times per night to void. Voiding dysfunction: she empties her bladder well.  does not use a catheter to empty bladder.  When urinating, she feels dribbling after finishing, the need to urinate multiple times in a row and to push on her belly or vagina to empty bladder Drinks: 2 cup coffee, 6 oz lipton tea, 3-16oz water bottles per day  UTIs: 1 UTI's in the last year.   Reports history of kidney or bladder stones  Pelvic Organ Prolapse Symptoms:                  She denies a feeling of a bulge the vaginal area.   Bowel Symptom: Bowel movements: 1 time(s) per day Stool consistency: soft  Straining: no.  Splinting: no.  Incomplete evacuation: yes.  She Denies accidental bowel leakage / fecal incontinence Bowel regimen: fiber Last colonoscopy: Date abt 3 years ago, Results negative  Sexual Function Sexually active: no.   Pelvic Pain Admits to pelvic pain when her bladder becomes full. Feels uncomfortable until she empties.    Past Medical History:  Past Medical History:  Diagnosis Date  . Anxiety   . Bleeding in brain  due to brain aneurysm ([place])   . Bulging of cervical intervertebral disc    C4-5 &  C6-7  . Constipation   . DDD (degenerative disc disease), cervical    stenosis  . Depression   . Family history of premature CAD   . GERD (gastroesophageal reflux disease)   . Gout   . History of endometriosis   . History of kidney stones     right side w associated n/v  . History of ovarian cyst   . History of rheumatic fever   . Hx of hepatitis C dx 04-16-2009 liver bx   mild portal/ periportal fibrosis-- lower serum level detection positive antibody virus RNA  . Hyperlipidemia   . OA (osteoarthritis)   . Osteoporosis   . PONV (postoperative nausea and vomiting)    pt states scopalomine patches help  . Pre-diabetes   . Right ureteral stone      Past Surgical History:   Past Surgical History:  Procedure Laterality Date  . CARDIOVASCULAR STRESS TEST  06/19/2015  dr Bronson Ing   normal nuclear study, low risk w/ overall LVSF normal and wall motion normal , stress ef 57%  . CARPAL TUNNEL RELEASE Right x2  last one  . CARPAL TUNNEL RELEASE Left 02/23/2013   Procedure: CARPAL TUNNEL RELEASE;  Surgeon: Carole Civil, MD;  Location: [facility];  Service: Orthopedics;  Laterality: Left;  . CHOLECYSTECTOMY  1999  . COLONOSCOPY  01/2012   Dr. Posey Pronto: tortous colon, multiple colon polyps, 2 cauterized in ascending colon, one transverse colon adenomatous, sigmoid polyps (6) hyperplastic.   Marland Kitchen CYSTOSCOPY W/ RETROGRADES Right 05/07/2016   Procedure: CYSTOSCOPY WITH RETROGRADE PYELOGRAM;  Surgeon: Festus Aloe, MD;  Location: [facility];  Service: Urology;  Laterality: Right;  . DX LAPAROSCOPY/  D & C HYSTEROSCOPY  2001  . ESOPHAGOGASTRODUODENOSCOPY  05/2012   Dr. Posey Pronto: esophagitis, gastritis, no hpylori  . LAPAROSCOPIC SALPINGO OOPHERECTOMY Right 08/24/2006  . RECTOCELE REPAIR N/A 01/13/2016   Procedure: POSTERIOR VAGINAL REPAIR (RECTOCELE);  Surgeon: Jonnie Kind, MD;  Location: [facility];  Service: Gynecology;  Laterality: N/A;  . TOTAL ABDOMINAL HYSTERECTOMY Left 05/22/2002   w/  Left Salpingo Oopherectomy  . TRANSTHORACIC ECHOCARDIOGRAM  06/19/2015   ef 60-65%/  trivial MR and PR  . TUBAL LIGATION  1989     Past OB/GYN History: OB History  Gravida Para Term Preterm AB Living  3 3 3         SAB IAB Ectopic Multiple Live Births               # Outcome Date GA Lbr Len/2nd Weight Sex Delivery Anes PTL Lv  3 Term           2 Term           1 Term            SVD x3 Menopausal: Yes S/p hysterectomy 2004 for endometriosis   Medications: She has a current medication list which includes the following prescription(s): albuterol, butalbital-acetaminophen-caffeine, cholecalciferol, dexilant, diazepam, duloxetine, hydrochlorothiazide, hydroxyzine, levocetirizine, lisinopril, metformin, mirabegron er, ondansetron, quetiapine, simvastatin, and trazodone.   Allergies: Patient is allergic to nitrofurantoin monohyd macro and sulfa antibiotics.   Social History:  Social History   Tobacco Use  . Smoking status: Former Smoker    Packs/day: 0.50    Years: 10.00    Pack years: 5.00    Types: Cigarettes    Start date: 05/10/1978  . Smokeless tobacco: Never Used  Vaping Use  . Vaping Use: Never used  Substance Use Topics  . Alcohol use: No    Alcohol/week: 0.0 standard drinks  . Drug use: No    Relationship status: long-term partner She lives alone.   She is not employed. Regular exercise: Yes: treadmill walking/ running  Family History:   Family History  Problem Relation Age of Onset  . Hypertension Mother   . Hyperlipidemia Mother   . Heart disease Father   . Heart disease Brother        passed age 56 from heart valve complication of some sort  . Colon polyps Brother   . Colon polyps Brother        deceased age 58 from "unknown"  . Colon cancer Neg Hx      Review of Systems: Review of Systems  Constitutional: Negative for fever, malaise/fatigue and  weight loss.  Respiratory: Negative for cough, shortness of breath and wheezing.   Cardiovascular: Negative for chest pain, palpitations and leg swelling.  Gastrointestinal: Positive for abdominal pain. Negative for blood in stool.  Genitourinary: Negative for dysuria.  Musculoskeletal: Negative for myalgias.  Skin: Negative for rash.  Neurological: Positive for headaches. Negative for dizziness.  Endo/Heme/Allergies: Does not bruise/bleed easily.  Psychiatric/Behavioral: Positive for depression. The patient is nervous/anxious.      OBJECTIVE Physical Exam: Vitals:   06/24/20 1041  BP: 125/82  Pulse: (!) 116  Weight: 159 lb 8  oz (72.3 kg)  Height: 4\' 11"  (1.499 m)    Physical Exam Constitutional:      General: She is not in acute distress. Pulmonary:     Effort: Pulmonary effort is normal.  Abdominal:     General: There is no distension.     Palpations: Abdomen is soft.     Tenderness: There is no abdominal tenderness. There is no rebound.  Musculoskeletal:        General: No swelling. Normal range of motion.  Skin:    General: Skin is warm and dry.     Findings: No rash.  Neurological:     Mental Status: She is alert and oriented to person, place, and time.  Psychiatric:        Mood and Affect: Mood normal.        Behavior: Behavior normal.      GU / Detailed Urogynecologic Evaluation:  Pelvic Exam: Normal external female genitalia; Bartholin's and Skene's glands normal in appearance; urethral meatus normal in appearance, no urethral masses or discharge.   CST: negative  s/p hysterectomy: Speculum exam reveals normal vaginal mucosa with atrophy and normal vaginal cuff.  Adnexa no mass, fullness, tenderness.    Pelvic floor strength I/V  Pelvic floor musculature: Right levator tender, Right obturator tender, Left levator tender, Left obturator tender  POP-Q:   POP-Q  -2                                            Aa   -2                                            Ba  -6                                              C   3                                            Gh  2                                            Pb  7                                            tvl   -2                                            Ap  -2  Bp                                                 D     Rectal Exam:  Normal external rectum  Post-Void Residual (PVR) by Bladder Scan: In order to evaluate bladder emptying, we discussed obtaining a postvoid residual and she agreed to this procedure.  Procedure: The ultrasound unit was placed on the patient's abdomen in the suprapubic region after the patient had voided. A PVR of 22 ml was obtained by bladder scan.  Laboratory Results: POC urine: negative  I visualized the urine specimen, noting the specimen to be clear yellow  ASSESSMENT AND PLAN Ms. Raneri is a 60 y.o. with:  1. Overactive bladder   2. Urinary frequency   3. Levator spasm   4. SUI (stress urinary incontinence, female)     1. OAB We discussed the symptoms of overactive bladder (OAB), which include urinary urgency, urinary frequency, nocturia, with or without urge incontinence.  While we do not know the exact etiology of OAB, several treatment options exist. We discussed management including behavioral therapy (decreasing bladder irritants, urge suppression strategies, timed voids, bladder retraining), physical therapy, medication. For Beta-3 agonist medication, we discussed the potential side effect of elevated blood pressure which is more likely to occur in individuals with uncontrolled hypertension. - She will work on reducing bladder irritants and drinking more water- list provided. Also reviewed bladder retraining and distraction techniques.  - Prescribed Myrbetriq 25mg  daily.   2. Urinary frequency - no sign of UTI on POC urine today  3. Levator spasm The origin of pelvic floor muscle  spasm can be multifactorial, including primary, reactive to a different pain source, trauma, or even part of a centralized pain syndrome.Treatment options include pelvic floor physical therapy, local (vaginal) or oral  muscle relaxants, pelvic muscle trigger point injections or centrally acting pain medications.   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  - Will start with vaginal valium 5mg  tablets weekly at night then as needed after  4. SUI For treatment of stress urinary incontinence,  non-surgical options include expectant management, weight loss, physical therapy, as well as a pessary and surgical options.  - Not as bothersome as UUI symptoms. She was able to demonstrate kegel exercise today; however, advised her to avoid doing them for a few weeks to allow levator muscles to rest and prevent spasm. Will readdress symptoms at next visit.   Follow up 6 weeks.   Jaquita Folds, MD   Medical Decision Making:  - Reviewed/ ordered a clinical laboratory test - Review and summation of prior records

## 2020-06-24 ENCOUNTER — Other Ambulatory Visit: Payer: Self-pay

## 2020-06-24 ENCOUNTER — Encounter: Payer: Self-pay | Admitting: Obstetrics and Gynecology

## 2020-06-24 ENCOUNTER — Ambulatory Visit (INDEPENDENT_AMBULATORY_CARE_PROVIDER_SITE_OTHER): Payer: 59 | Admitting: Obstetrics and Gynecology

## 2020-06-24 VITALS — BP 125/82 | HR 116 | Ht 59.0 in | Wt 159.5 lb

## 2020-06-24 DIAGNOSIS — N393 Stress incontinence (female) (male): Secondary | ICD-10-CM | POA: Diagnosis not present

## 2020-06-24 DIAGNOSIS — N3281 Overactive bladder: Secondary | ICD-10-CM

## 2020-06-24 DIAGNOSIS — M62838 Other muscle spasm: Secondary | ICD-10-CM | POA: Diagnosis not present

## 2020-06-24 DIAGNOSIS — R350 Frequency of micturition: Secondary | ICD-10-CM

## 2020-06-24 LAB — POCT URINALYSIS DIPSTICK
Appearance: NORMAL
Bilirubin, UA: NEGATIVE
Blood, UA: NEGATIVE
Glucose, UA: NEGATIVE
Ketones, UA: NEGATIVE
Leukocytes, UA: NEGATIVE
Nitrite, UA: NEGATIVE
Protein, UA: NEGATIVE
Spec Grav, UA: 1.01 (ref 1.010–1.025)
Urobilinogen, UA: 0.2 E.U./dL
pH, UA: 6 (ref 5.0–8.0)

## 2020-06-24 MED ORDER — DIAZEPAM 5 MG PO TABS: ORAL_TABLET | ORAL | 0 refills | Status: DC

## 2020-06-24 MED ORDER — MIRABEGRON ER 25 MG PO TB24: 25.0000 mg | ORAL_TABLET | Freq: Every day | ORAL | 5 refills | Status: DC

## 2020-06-24 NOTE — Patient Instructions (Addendum)
Today we talked about ways to manage bladder urgency such as altering your diet to avoid irritative beverages and foods (bladder diet) as well as attempting to decrease stress and other exacerbating factors.    The Most Bothersome Foods* The Least Bothersome Foods*  Coffee - Regular & Decaf Tea - caffeinated Carbonated beverages - cola, non-colas, diet & caffeine-free Alcohols - Beer, Red Wine, White Wine, Champagne Fruits - Grapefruit, [place], Orange, [employer] - Cranberry, Grapefruit, Orange, Pineapple Vegetables - Tomato & Tomato Products Flavor Enhancers - Hot peppers, Spicy foods, Chili, Horseradish, Vinegar, Monosodium glutamate (MSG) Artificial Sweeteners - NutraSweet, Sweet 'N Low, Equal (sweetener), Saccharin Ethnic foods - [place], [place], [place] food Express Scripts - low-fat & whole Fruits - Bananas, Blueberries, Honey[place]w melon, Pears, Raisins, Watermelon Vegetables - Broccoli, Brussels Sprouts, [place], Carrots, Cauliflower, [place], Cucumber, Mushrooms, Peas, Radishes, Squash, Zucchini, White potatoes, Sweet potatoes & yams Poultry - Chicken, Eggs, [place], [employer] - Beef, Programmer, multimedia, Lamb Seafood - Shrimp, [place] fish, Salmon Grains - Oat, Rice Snacks - Pretzels, Popcorn  *Lissa Morales et al. Diet and its role in interstitial cystitis/bladder pain syndrome (IC/BPS) and comorbid conditions. [place] 2012 Jan 11.   I will prescribe valium 5 mg pills to place vaginally daily for vaginal muscle spasms. Start at night and take one every night for the next week to see if it improves your symptoms. Once you are improving you can taper off the medication and just use as needed. If the medication makes you drowsy then only use at bedtime and/or we can reduce the dose. Do not use gel to place it, as that will prevent the tablet from dissolving.  Instead place 1-2 drops of water on the table before inserting it in the vagina. If the pills do not dissolve well we can switch to  a special compounded suppository. Let me know how you are doing on the medication and if you have any questions.    We discussed the symptoms of overactive bladder (OAB), which include urinary urgency, urinary frequency, night-time urination, with or without urge incontinence.  We discussed management including behavioral therapy (decreasing bladder irritants by following a bladder diet, urge suppression strategies, timed voids, bladder retraining), physical therapy, medication; and for refractory cases posterior tibial nerve stimulation, sacral neuromodulation, and intravesical botulinum toxin injection.   For Beta-3 agonist medication, we discussed the potential side effect of elevated blood pressure which is more likely to occur in individuals with uncontrolled hypertension. You were given Myrbetriq 25 mg.  It can take a month to start working so give it time, but if you have bothersome side effects call sooner and we can try a different medication.  Call us if you have trouble filling the prescription or if it's not covered by your insurance.

## 2020-07-15 ENCOUNTER — Encounter: Payer: Self-pay | Admitting: Internal Medicine

## 2020-07-15 ENCOUNTER — Ambulatory Visit: Payer: 59 | Admitting: Internal Medicine

## 2020-07-31 NOTE — Progress Notes (Deleted)
[facility] Return Visit  SUBJECTIVE  History of Present Illness: Taima Rada is a 60 y.o. female seen in follow-up for OAB, levator spasm and SUI. Plan at last visit was to reduce bladder irritants, start Myrbetriq 25mg , and to use vaginal valium for levator spasm.     Past Medical History: Patient  has a past medical history of Anxiety, Bleeding in brain due to brain aneurysm ([place]), Bulging of cervical intervertebral disc, Constipation, DDD (degenerative disc disease), cervical, Depression, Family history of premature CAD, GERD (gastroesophageal reflux disease), Gout, History of endometriosis, History of kidney stones, History of ovarian cyst, History of rheumatic fever, hepatitis C (dx 04-16-2009 liver bx), Hyperlipidemia, OA (osteoarthritis), Osteoporosis, PONV (postoperative nausea and vomiting), Pre-diabetes, and Right ureteral stone.   Past Surgical History: She  has a past surgical history that includes Carpal tunnel release (Right, x2  last one); Carpal tunnel release (Left, 02/23/2013); Rectocele repair (N/A, 01/13/2016); Total abdominal hysterectomy (Left, 05/22/2002); Tubal ligation (1989); Cholecystectomy (1999); Laparoscopic salpingo oophorectomy (Right, 08/24/2006); DX LAPAROSCOPY/  D & C HYSTEROSCOPY (2001); transthoracic echocardiogram (06/19/2015); Cardiovascular stress test (06/19/2015  dr Bronson Ing); Cystoscopy w/ retrogrades (Right, 05/07/2016); Esophagogastroduodenoscopy (05/2012); and Colonoscopy (01/2012).   Medications: She has a current medication list which includes the following prescription(s): albuterol, butalbital-acetaminophen-caffeine, cholecalciferol, dexilant, diazepam, duloxetine, hydrochlorothiazide, hydroxyzine, levocetirizine, lisinopril, metformin, mirabegron er, ondansetron, quetiapine, simvastatin, and trazodone.   Allergies: Patient is allergic to nitrofurantoin monohyd macro and sulfa antibiotics.   Social History: Patient  reports that  she has quit smoking. Her smoking use included cigarettes. She started smoking about 42 years ago. She has a 5.00 pack-year smoking history. She has never used smokeless tobacco. She reports that she does not drink alcohol and does not use drugs.      OBJECTIVE     Physical Exam: There were no vitals filed for this visit. Gen: No apparent distress, A&O x 3.  Detailed Urogynecologic Evaluation:  Deferred. Prior exam showed:  No flowsheet data found.     ASSESSMENT AND PLAN    Ms. Stopper is a 60 y.o. with:  No diagnosis found.

## 2020-08-05 ENCOUNTER — Ambulatory Visit: Payer: 59 | Admitting: Obstetrics and Gynecology

## 2020-08-26 ENCOUNTER — Ambulatory Visit (INDEPENDENT_AMBULATORY_CARE_PROVIDER_SITE_OTHER): Payer: 59 | Admitting: Internal Medicine

## 2020-08-26 ENCOUNTER — Other Ambulatory Visit: Payer: Self-pay

## 2020-08-26 ENCOUNTER — Telehealth: Payer: Self-pay

## 2020-08-26 ENCOUNTER — Encounter: Payer: Self-pay | Admitting: Internal Medicine

## 2020-08-26 ENCOUNTER — Encounter: Payer: Self-pay | Admitting: *Deleted

## 2020-08-26 ENCOUNTER — Other Ambulatory Visit: Payer: Self-pay | Admitting: *Deleted

## 2020-08-26 VITALS — BP 123/78 | HR 102 | Temp 96.8°F | Ht 59.0 in | Wt 161.4 lb

## 2020-08-26 DIAGNOSIS — Z8619 Personal history of other infectious and parasitic diseases: Secondary | ICD-10-CM

## 2020-08-26 DIAGNOSIS — K5909 Other constipation: Secondary | ICD-10-CM | POA: Diagnosis not present

## 2020-08-26 DIAGNOSIS — K219 Gastro-esophageal reflux disease without esophagitis: Secondary | ICD-10-CM | POA: Diagnosis not present

## 2020-08-26 NOTE — Patient Instructions (Addendum)
We will obtain a CBC, Chem-12, HCV RNA and INR today to assess your  liver  Ultrasound with elastography of your liver as well to be scheduled  You should have a surveillance colonoscopy given the type and number of polyps removed previously.  However, since your last colonoscopy you suffered a cerebral hemorhage from an aneurysm.    Since it is not an emergency, I will check with your neurosurgeon, Dr. Manya Silvas regarding his recommendations as to the timing of your next colonoscopy.  Try Benefiber 1 teaspoon daily for 3 weeks and then increase to 2 teaspoons thereafter in the hopes this will help your bowel function.  Further recommendations to follow in the very near future.

## 2020-08-26 NOTE — Telephone Encounter (Deleted)
Pt called with a 4 week progress report- He wanted me to let Cyril Mourning know that he has been taking the benefiber and he said he likes the taste of it and it is easier to take than metamucil. He also said for the first 2 weeks, he didn't notice much change in his BM's but the second 2 weeks he noticed a slight change. He has been following the diet and eating less gassy foods. He is not as gassy now and his BM's are more like pudding instead of water.  I advised him that I would send a message to [facility] and let her know and if she wanted [place] to call him back we would. He is aware that she is rounding at the hospital today and I would not be calling back today. He said he is fine with that.

## 2020-08-26 NOTE — Progress Notes (Signed)
Primary Care Physician:  Quentin Cornwall, MD Primary Gastroenterologist:  Dr. Gala Romney  Pre-Procedure History & Physical: HPI:  Sabrina Beasley is a 60 y.o. female here on referral from [facility] for surveillance colonoscopy.  She has had polyps on her last 2 colonoscopies;  she reports having 9 removed in 2013 and apparently had 6 removed in 2019 with follow-up colonoscopy wn 3 to 5-year period recommended.  Aside from having chronic constipation, she really is devoid of any lower GI tract symptoms. 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  Status post coiling.  Followed by a neurosurgeon in [place]. She does have a history of hepatitis C positive antibody with a negative PCR.  She had some mild periportal fibrosis on a prior liver biopsy about 10 years ago.  Also,  she had episode of icteric hepatitis at that time which was felt to be medication related (statin). This nice lady has a history of GERD well controlled on Dexilant 60 mg daily.  No dysphagia.    Past Medical History:  Diagnosis Date  . Anxiety   . Bleeding in brain due to brain aneurysm ([place])   . Bulging of cervical intervertebral disc    C4-5 &  C6-7  . Constipation   . DDD (degenerative disc disease), cervical    stenosis  . Depression   . Family history of premature CAD   . GERD (gastroesophageal reflux disease)   . Gout   . History of endometriosis   . History of kidney stones     right side w associated n/v  . History of ovarian cyst   . History of rheumatic fever   . Hx of hepatitis C dx 04-16-2009 liver bx   mild portal/ periportal fibrosis-- lower serum level detection positive antibody virus RNA  . Hyperlipidemia   . OA (osteoarthritis)   . Osteoporosis   . PONV (postoperative nausea and vomiting)    pt states scopalomine patches help  . Pre-diabetes   . Right ureteral stone      Past Surgical History:  Procedure Laterality Date  . CARDIOVASCULAR STRESS TEST  06/19/2015  dr Bronson Ing   normal nuclear study, low risk w/ overall LVSF normal and wall motion normal , stress ef 57%  . CARPAL TUNNEL RELEASE Right x2  last one  . CARPAL TUNNEL RELEASE Left 02/23/2013   Procedure: CARPAL TUNNEL RELEASE;  Surgeon: Carole Civil, MD;  Location: [facility];  Service: Orthopedics;  Laterality: Left;  . CHOLECYSTECTOMY  1999  . COLONOSCOPY  01/2012   Dr. Posey Pronto: tortous colon, multiple colon polyps, 2 cauterized in ascending colon, one transverse colon adenomatous, sigmoid polyps (6) hyperplastic.   Marland Kitchen CYSTOSCOPY W/ RETROGRADES Right 05/07/2016   Procedure: CYSTOSCOPY WITH RETROGRADE PYELOGRAM;  Surgeon: Festus Aloe, MD;  Location: [facility];  Service: Urology;  Laterality: Right;  . DX LAPAROSCOPY/  D & C HYSTEROSCOPY  2001  . ESOPHAGOGASTRODUODENOSCOPY  05/2012   Dr. Posey Pronto: esophagitis, gastritis, no hpylori  . LAPAROSCOPIC SALPINGO OOPHERECTOMY Right 08/24/2006  . RECTOCELE REPAIR N/A 01/13/2016   Procedure: POSTERIOR VAGINAL REPAIR (RECTOCELE);  Surgeon: Jonnie Kind, MD;  Location: [facility];  Service: Gynecology;  Laterality: N/A;  . TOTAL ABDOMINAL HYSTERECTOMY Left 05/22/2002   w/  Left Salpingo Oopherectomy  . TRANSTHORACIC ECHOCARDIOGRAM  06/19/2015   ef 60-65%/  trivial MR and PR  .  TUBAL LIGATION  1989    Prior to Admission medications   Medication Sig Start Date End Date Taking? Authorizing Provider  albuterol (VENTOLIN HFA) 108 (90 Base) MCG/ACT inhaler SMARTSIG:2 Puff(s) By Mouth Every 4 Hours PRN 06/17/20  Yes [provider]  butalbital-acetaminophen-caffeine (FIORICET, ESGIC) 50-325-40 MG tablet Take 1 tablet by mouth as needed. 11/14/17  Yes [provider]  cholecalciferol (VITAMIN D) 25 MCG (1000 UNIT) tablet Take 1,000 Units by mouth daily. 04/30/19  Yes [provider]  DEXILANT 60 MG capsule Take 1  capsule by mouth once daily 07/28/18  Yes Annitta Needs, NP  diazepam (VALIUM) 5 MG tablet Place 1 tablet vaginally nightly as needed for muscle spasm/ pelvic pain. 06/24/20  Yes Jaquita Folds, MD  DULoxetine (CYMBALTA) 30 MG capsule Take 30 mg by mouth daily. Takes with 60mg =90mg  06/30/20  Yes [provider]  DULoxetine (CYMBALTA) 60 MG capsule Take 60 mg by mouth daily. Takes with 30mg =90mg  06/08/17  Yes [provider]  hydrochlorothiazide (MICROZIDE) 12.5 MG capsule Take 12.5 mg by mouth daily. 06/08/18  Yes [provider]  hydrOXYzine (ATARAX/VISTARIL) 25 MG tablet 1 tablet as needed   Yes [provider]  levocetirizine (XYZAL) 5 MG tablet Take 5 mg by mouth every evening.   Yes [provider]  lisinopril (PRINIVIL,ZESTRIL) 40 MG tablet Take 1 tablet by mouth daily. 04/27/18  Yes [provider]  mirabegron ER (MYRBETRIQ) 25 MG TB24 tablet Take 1 tablet (25 mg total) by mouth daily. 06/24/20  Yes Jaquita Folds, MD  ondansetron (ZOFRAN) 8 MG tablet Take 8 mg by mouth 3 (three) times daily as needed. 06/27/19  Yes [provider]  QUEtiapine (SEROQUEL) 50 MG tablet Take 100 mg by mouth at bedtime. 05/23/18  Yes [provider]  simvastatin (ZOCOR) 40 MG tablet Take 40 mg by mouth at bedtime.   Yes [provider]  traZODone (DESYREL) 150 MG tablet Take 150 mg by mouth at bedtime.   Yes [provider]    Allergies as of 08/26/2020 - Review Complete 08/26/2020  Allergen Reaction Noted  . Nitrofurantoin monohyd macro Nausea And Vomiting 11/03/2010  . Sulfa antibiotics Rash 06/12/2015    Family History  Problem Relation Age of Onset  . Hypertension Mother   . Hyperlipidemia Mother   . Heart disease Father   . Heart disease Brother        passed age 16 from heart valve complication of some sort  . Colon polyps Brother   . Colon polyps Brother        deceased age 60 from "unknown"  .  Colon cancer Neg Hx     Social History   Socioeconomic History  . Marital status: Married    Spouse name: Not on file  . Number of children: Not on file  . Years of education: Not on file  . Highest education level: Not on file  Occupational History  . Not on file  Tobacco Use  . Smoking status: Former Smoker    Packs/day: 0.50    Years: 10.00    Pack years: 5.00    Types: Cigarettes    Start date: 05/10/1978  . Smokeless tobacco: Never Used  Vaping Use  . Vaping Use: Never used  Substance and Sexual Activity  . Alcohol use: No    Alcohol/week: 0.0 standard drinks  . Drug use: No  . Sexual activity: Not Currently    Partners: Male    Birth  control/protection: Surgical  Other Topics Concern  . Not on file  Social History Narrative  . Not on file   Social Determinants of Health   Financial Resource Strain: Not on file  Food Insecurity: Not on file  Transportation Needs: Not on file  Physical Activity: Not on file  Stress: Not on file  Social Connections: Not on file  Intimate Partner Violence: Not on file    Review of Systems: See HPI, otherwise negative ROS  Physical Exam: BP 123/78   Pulse (!) 102   Temp (!) 96.8 F (36 C) (Temporal)   Ht 4\' 11"  (1.499 m)   Wt 161 lb 6.4 oz (73.2 kg)   BMI 32.60 kg/m  General:   Alert,  Well-developed, well-nourished, pleasant and cooperative in NAD Eyes:  Sclera clear, no icterus.   Conjunctiva pink. Neck:  Supple; no masses or thyromegaly. No significant cervical adenopathy. Lungs:  Clear throughout to auscultation.   No wheezes, crackles, or rhonchi. No acute distress. Heart:  Regular rate and rhythm; no murmurs, clicks, rubs,  or gallops. Abdomen: Non-distended, normal bowel sounds.  Soft and nontender without appreciable mass or hepatosplenomegaly.  Pulses:  Normal pulses noted. Extremities:  Without clubbing or edema.   Impression/Plan: Very pleasant 60 year old lady with a history of multiple colonic adenomas  removed over time who has been referred for surveillance colonoscopy.  Her only GI symptom is that of mild constipation.  She has not had any bleeding.  She certainly is a candidate for a surveillance colonoscopy at this time.  She has a notable interim history of a subarachnoid hemorrhage secondary to cerebral aneurysm.  This comorbidity needs to be considered in scheduling her follow-up colonoscopy.  I feel the best practice would be to touch base with her neurosurgeon to get his opinion about proceeding.  She would be due for follow-up MRI in October of this year  She has mild constipation which may improve with simple addition of a fiber supplement.  GERD well controlled on Dexilant  History of hepatitis C positive antibody.  Subsequent PCR negative on at least one occasion elsewhere.  Mild periportal fibrosis on a prior liver biopsy.  Her liver does need to be reassessed as a separate issue  Recommendations: We will obtain a CBC, Chem-12, HCV RNA and INR today to assess her  liver  Hepatic ultrasound with elastography to be scheduled  Surveillance colonoscopy indicated given the type and number of polyps removed previously.  Since it is not an emergency, I will check with patient's neurosurgeon, Dr. Manya Silvas regarding his recommendations as to the timing of your next colonoscopy.  Try Benefiber 1 teaspoon daily for 3 weeks and then increase to 2 teaspoons thereafter in the hopes this will help your bowel function.  Further recommendations to follow in the very near future.   Notice: This dictation was prepared with Dragon dictation along with smaller phrase technology. Any transcriptional errors that result from this process are unintentional and may not be corrected upon review.

## 2020-08-26 NOTE — Telephone Encounter (Signed)
Error Wrong chart

## 2020-09-02 ENCOUNTER — Ambulatory Visit: Payer: 59 | Admitting: Obstetrics and Gynecology

## 2020-09-02 NOTE — Progress Notes (Deleted)
[facility] Return Visit  SUBJECTIVE  History of Present Illness: Sabrina Beasley is a 60 y.o. female seen in follow-up for OAB and levator spasm. Plan at last visit was to start Myrbetriq 25mg  and vaginal valium 5mg  tablets.     Past Medical History: Patient  has a past medical history of Anxiety, Bleeding in brain due to brain aneurysm ([place]), Bulging of cervical intervertebral disc, Constipation, DDD (degenerative disc disease), cervical, Depression, Family history of premature CAD, GERD (gastroesophageal reflux disease), Gout, History of endometriosis, History of kidney stones, History of ovarian cyst, History of rheumatic fever, hepatitis C (dx 04-16-2009 liver bx), Hyperlipidemia, OA (osteoarthritis), Osteoporosis, PONV (postoperative nausea and vomiting), Pre-diabetes, and Right ureteral stone.   Past Surgical History: She  has a past surgical history that includes Carpal tunnel release (Right, x2  last one); Carpal tunnel release (Left, 02/23/2013); Rectocele repair (N/A, 01/13/2016); Total abdominal hysterectomy (Left, 05/22/2002); Tubal ligation (1989); Cholecystectomy (1999); Laparoscopic salpingo oophorectomy (Right, 08/24/2006); DX LAPAROSCOPY/  D & C HYSTEROSCOPY (2001); transthoracic echocardiogram (06/19/2015); Cardiovascular stress test (06/19/2015  dr Bronson Ing); Cystoscopy w/ retrogrades (Right, 05/07/2016); Esophagogastroduodenoscopy (05/2012); and Colonoscopy (01/2012).   Medications: She has a current medication list which includes the following prescription(s): albuterol, butalbital-acetaminophen-caffeine, cholecalciferol, dexilant, diazepam, duloxetine, duloxetine, hydrochlorothiazide, hydroxyzine, levocetirizine, lisinopril, mirabegron er, ondansetron, quetiapine, simvastatin, and trazodone.   Allergies: Patient is allergic to nitrofurantoin monohyd macro and sulfa antibiotics.   Social History: Patient  reports that she has quit smoking. Her smoking use  included cigarettes. She started smoking about 42 years ago. She has a 5.00 pack-year smoking history. She has never used smokeless tobacco. She reports that she does not drink alcohol and does not use drugs.      OBJECTIVE     Physical Exam: There were no vitals filed for this visit. Gen: No apparent distress, A&O x 3.  Detailed Urogynecologic Evaluation:  Deferred. Prior exam showed:  No flowsheet data found.     ASSESSMENT AND PLAN    Ms. Temkin is a 60 y.o. with:  No diagnosis found.

## 2020-09-04 ENCOUNTER — Ambulatory Visit (HOSPITAL_COMMUNITY)
Admission: RE | Admit: 2020-09-04 | Discharge: 2020-09-04 | Disposition: A | Discharge status: Home / Self Care | Payer: 59 | Source: Ambulatory Visit | Attending: Internal Medicine | Admitting: Internal Medicine

## 2020-09-04 DIAGNOSIS — Z8619 Personal history of other infectious and parasitic diseases: Secondary | ICD-10-CM | POA: Diagnosis present

## 2020-09-07 LAB — CBC WITH DIFFERENTIAL/PLATELET
Absolute Monocytes: 264 cells/uL (ref 200–950)
Basophils Absolute: 40 cells/uL (ref 0–200)
Basophils Relative: 0.5 %
Eosinophils Absolute: 288 cells/uL (ref 15–500)
Eosinophils Relative: 3.6 %
HCT: 34 % — ABNORMAL LOW (ref 35.0–45.0)
Hemoglobin: 11.2 g/dL — ABNORMAL LOW (ref 11.7–15.5)
Lymphs Abs: 1952 cells/uL (ref 850–3900)
MCH: 29.7 pg (ref 27.0–33.0)
MCHC: 32.9 g/dL (ref 32.0–36.0)
MCV: 90.2 fL (ref 80.0–100.0)
MPV: 10.7 fL (ref 7.5–12.5)
Monocytes Relative: 3.3 %
Neutro Abs: 5456 cells/uL (ref 1500–7800)
Neutrophils Relative %: 68.2 %
Platelets: 425 10*3/uL — ABNORMAL HIGH (ref 140–400)
RBC: 3.77 10*6/uL — ABNORMAL LOW (ref 3.80–5.10)
RDW: 12.3 % (ref 11.0–15.0)
Total Lymphocyte: 24.4 %
WBC: 8 10*3/uL (ref 3.8–10.8)

## 2020-09-07 LAB — COMPLETE METABOLIC PANEL WITH GFR
AG Ratio: 2 (calc) (ref 1.0–2.5)
ALT: 21 U/L (ref 6–29)
AST: 23 U/L (ref 10–35)
Albumin: 4.4 g/dL (ref 3.6–5.1)
Alkaline phosphatase (APISO): 45 U/L (ref 37–153)
BUN/Creatinine Ratio: 22 (calc) (ref 6–22)
BUN: 26 mg/dL — ABNORMAL HIGH (ref 7–25)
CO2: 29 mmol/L (ref 20–32)
Calcium: 10.5 mg/dL — ABNORMAL HIGH (ref 8.6–10.4)
Chloride: 105 mmol/L (ref 98–110)
Creat: 1.17 mg/dL — ABNORMAL HIGH (ref 0.50–1.05)
GFR, Est African American: 60 mL/min/{1.73_m2} (ref >=60)
GFR, Est Non African American: 52 mL/min/{1.73_m2} — ABNORMAL LOW (ref >=60)
Globulin: 2.2 g/dL (calc) (ref 1.9–3.7)
Glucose, Bld: 168 mg/dL — ABNORMAL HIGH (ref 65–139)
Potassium: 4.5 mmol/L (ref 3.5–5.3)
Sodium: 143 mmol/L (ref 135–146)
Total Bilirubin: 0.3 mg/dL (ref 0.2–1.2)
Total Protein: 6.6 g/dL (ref 6.1–8.1)

## 2020-09-07 LAB — HEPATITIS C RNA QUANTITATIVE
HCV Quantitative Log: <1.18 log IU/mL
HCV RNA, PCR, QN: <15 IU/mL

## 2020-09-07 LAB — PROTIME-INR
INR: 1
Prothrombin Time: 10.3 s (ref 9.0–11.5)

## 2020-10-04 NOTE — Addendum Note (Signed)
Encounter addended by: Annie Paras on: 10/04/2020 1:09 PM  Actions taken: Letter saved

## 2020-10-13 ENCOUNTER — Telehealth: Payer: Self-pay | Admitting: Internal Medicine

## 2020-10-13 NOTE — Telephone Encounter (Signed)
Pt has questions about the letter she received regarding her [place] results. Please call [phone]

## 2020-10-13 NOTE — Telephone Encounter (Signed)
Pt aware that we are going to consult with Dr. Gala Romney tomorrow about the letter she received. Pt voiced understanding.

## 2020-10-14 NOTE — Telephone Encounter (Signed)
Angie, please let the pt know that per Dr.Rourk, the only other finding on her U/S was a POSSIBLE tiny stone in her bile duct and as long as she is not having any pain or other symptoms Dr.Rourk just wants to keep an eye on it.

## 2020-10-15 NOTE — Telephone Encounter (Signed)
Communication noted.  Await to hear from patient's neurosurgeon. Possible finding of a calculus in hepatic duct not clinically significant at this time.

## 2020-10-15 NOTE — Telephone Encounter (Addendum)
Spoke with pt.  She was made aware of Dr. Roseanne Kaufman results and recommendations.  She voiced understanding.  Denies any symptoms or pain related issues.

## 2020-10-21 NOTE — Telephone Encounter (Signed)
Routing to [facility] clinical pool 

## 2020-10-21 NOTE — Telephone Encounter (Signed)
Dr.Rourk, is it ok with you to go ahead and schedule this pt for a tcs? If so, What ASA? Do you want propofol?

## 2020-10-28 ENCOUNTER — Encounter: Payer: Self-pay | Admitting: *Deleted

## 2020-10-28 MED ORDER — PEG 3350-KCL-NA BICARB-NACL 420 G PO SOLR: ORAL | 0 refills | Status: DC

## 2020-10-28 NOTE — Telephone Encounter (Signed)
Called pt. She has been scheduled for 8/18 at 11:15am. Aware will mail prep instructions with pre-op appt and will send Rx to [facility].

## 2020-10-28 NOTE — Telephone Encounter (Signed)
PA approved via [facility]. Auth# [id number], DOS Dec 25, 2020 - Mar 25, 2021

## 2020-10-28 NOTE — Addendum Note (Signed)
Addended by: Cheron Every on: 10/28/2020 03:06 PM   Modules accepted: Orders

## 2020-12-16 NOTE — Patient Instructions (Signed)
Loisteen Mukherjee  12/16/2020     '@PREFPERIOPPHARMACY'$ @   Your procedure is scheduled on  12/25/2020.   Report to [facility] at  0930  A.M.   Call this number if you have problems the morning of surgery:  [phone]   Remember:  Follow the diet and prep instructions given to you by the office.     Use your inhalers before you come and bring your rescue inhaler with you.    Take these medicines the morning of surgery with A SIP OF WATER     fioricet (if needed), dexilant, cymbalta, hydroxyzine, zofran (if needed).     Do not wear jewelry, make-up or nail polish.  Do not wear lotions, powders, or perfumes, or deodorant.  Do not shave 48 hours prior to surgery.  Men may shave face and neck.  Do not bring valuables to the hospital.  [facility] is not responsible for any belongings or valuables.  Contacts, dentures or bridgework may not be worn into surgery.  Leave your suitcase in the car.  After surgery it may be brought to your room.  For patients admitted to the hospital, discharge time will be determined by your treatment team.  Patients discharged the day of surgery will not be allowed to drive home and must have someone with them for 24 hours.    Special instructions:   DO NOT smoke tobacco or vape for 24 hours before your procedure.  Please read over the following fact sheets that you were given. Anesthesia Post-op Instructions and Care and Recovery After Surgery      Colonoscopy, Adult, Care After This sheet gives you information about how to care for yourself after your procedure. Your health care provider may also give you more specific instructions. If you have problems or questions, contact your health careprovider. What can I expect after the procedure? After the procedure, it is common to have: A small amount of blood in your stool for 24 hours after the procedure. Some gas. Mild cramping or bloating of your abdomen. Follow these instructions at  home: Eating and drinking  Drink enough fluid to keep your urine pale yellow. Follow instructions from your health care provider about eating or drinking restrictions. Resume your normal diet as instructed by your health care provider. Avoid heavy or fried foods that are hard to digest.  Activity Rest as told by your health care provider. Avoid sitting for a long time without moving. Get up to take short walks every 1-2 hours. This is important to improve blood flow and breathing. Ask for help if you feel weak or unsteady. Return to your normal activities as told by your health care provider. Ask your health care provider what activities are safe for you. Managing cramping and bloating  Try walking around when you have cramps or feel bloated. Apply heat to your abdomen as told by your health care provider. Use the heat source that your health care provider recommends, such as a moist heat pack or a heating pad. Place a towel between your skin and the heat source. Leave the heat on for 20-30 minutes. Remove the heat if your skin turns bright red. This is especially important if you are unable to feel pain, heat, or cold. You may have a greater risk of getting burned.  General instructions If you were given a sedative during the procedure, it can affect you for several hours. Do not drive or operate machinery until your  health care provider says that it is safe. For the first 24 hours after the procedure: Do not sign important documents. Do not drink alcohol. Do your regular daily activities at a slower pace than normal. Eat soft foods that are easy to digest. Take over-the-counter and prescription medicines only as told by your health care provider. Keep all follow-up visits as told by your health care provider. This is important. Contact a health care provider if: You have blood in your stool 2-3 days after the procedure. Get help right away if you have: More than a small spotting of  blood in your stool. Large blood clots in your stool. Swelling of your abdomen. Nausea or vomiting. A fever. Increasing pain in your abdomen that is not relieved with medicine. Summary After the procedure, it is common to have a small amount of blood in your stool. You may also have mild cramping and bloating of your abdomen. If you were given a sedative during the procedure, it can affect you for several hours. Do not drive or operate machinery until your health care provider says that it is safe. Get help right away if you have a lot of blood in your stool, nausea or vomiting, a fever, or increased pain in your abdomen. This information is not intended to replace advice given to you by your health care provider. Make sure you discuss any questions you have with your healthcare provider. Document Revised: 04/20/2019 Document Reviewed: 11/20/2018 Elsevier Patient Education  [place] After This sheet gives you information about how to care for yourself after your procedure. Your health care provider may also give you more specific instructions. If you have problems or questions, contact your health careprovider. What can I expect after the procedure? After the procedure, it is common to have: Tiredness. Forgetfulness about what happened after the procedure. Impaired judgment for important decisions. Nausea or vomiting. Some difficulty with balance. Follow these instructions at home: For the time period you were told by your health care provider:     Rest as needed. Do not participate in activities where you could fall or become injured. Do not drive or use machinery. Do not drink alcohol. Do not take sleeping pills or medicines that cause drowsiness. Do not make important decisions or sign legal documents. Do not take care of children on your own. Eating and drinking Follow the diet that is recommended by your health care provider. Drink  enough fluid to keep your urine pale yellow. If you vomit: Drink water, juice, or soup when you can drink without vomiting. Make sure you have little or no nausea before eating solid foods. General instructions Have a responsible adult stay with you for the time you are told. It is important to have someone help care for you until you are awake and alert. Take over-the-counter and prescription medicines only as told by your health care provider. If you have sleep apnea, surgery and certain medicines can increase your risk for breathing problems. Follow instructions from your health care provider about wearing your sleep device: Anytime you are sleeping, including during daytime naps. While taking prescription pain medicines, sleeping medicines, or medicines that make you drowsy. Avoid smoking. Keep all follow-up visits as told by your health care provider. This is important. Contact a health care provider if: You keep feeling nauseous or you keep vomiting. You feel light-headed. You are still sleepy or having trouble with balance after 24 hours. You develop a rash. You  have a fever. You have redness or swelling around the IV site. Get help right away if: You have trouble breathing. You have new-onset confusion at home. Summary For several hours after your procedure, you may feel tired. You may also be forgetful and have poor judgment. Have a responsible adult stay with you for the time you are told. It is important to have someone help care for you until you are awake and alert. Rest as told. Do not drive or operate machinery. Do not drink alcohol or take sleeping pills. Get help right away if you have trouble breathing, or if you suddenly become confused. This information is not intended to replace advice given to you by your health care provider. Make sure you discuss any questions you have with your healthcare provider. Document Revised: 01/10/2020 Document Reviewed:  03/29/2019 Elsevier Patient Education  2022 [employer].

## 2020-12-22 ENCOUNTER — Encounter (HOSPITAL_COMMUNITY): Payer: Self-pay

## 2020-12-22 ENCOUNTER — Other Ambulatory Visit: Payer: Self-pay

## 2020-12-22 ENCOUNTER — Encounter (HOSPITAL_COMMUNITY)
Admission: RE | Admit: 2020-12-22 | Discharge: 2020-12-22 | Disposition: A | Discharge status: Home / Self Care | Payer: 59 | Source: Ambulatory Visit | Attending: Internal Medicine | Admitting: Internal Medicine

## 2020-12-22 DIAGNOSIS — Z01818 Encounter for other preprocedural examination: Secondary | ICD-10-CM | POA: Diagnosis present

## 2020-12-22 LAB — COMPREHENSIVE METABOLIC PANEL
ALT: 26 U/L (ref 0–44)
AST: 25 U/L (ref 15–41)
Albumin: 4.3 g/dL (ref 3.5–5.0)
Alkaline Phosphatase: 44 U/L (ref 38–126)
Anion gap: 7 (ref 5–15)
BUN: 18 mg/dL (ref 6–20)
CO2: 24 mmol/L (ref 22–32)
Calcium: 10.5 mg/dL — ABNORMAL HIGH (ref 8.9–10.3)
Chloride: 106 mmol/L (ref 98–111)
Creatinine, Ser: 0.94 mg/dL (ref 0.44–1.00)
GFR, Estimated: >60 mL/min (ref >60)
Glucose, Bld: 90 mg/dL (ref 70–99)
Potassium: 3.8 mmol/L (ref 3.5–5.1)
Sodium: 137 mmol/L (ref 135–145)
Total Bilirubin: 0.3 mg/dL (ref 0.3–1.2)
Total Protein: 7.5 g/dL (ref 6.5–8.1)

## 2020-12-25 ENCOUNTER — Encounter (HOSPITAL_COMMUNITY): Payer: Self-pay | Admitting: Internal Medicine

## 2020-12-25 ENCOUNTER — Encounter (HOSPITAL_COMMUNITY)
Admission: RE | Disposition: A | Discharge status: Home / Self Care | Payer: Self-pay | Source: Home / Self Care | Attending: Internal Medicine

## 2020-12-25 ENCOUNTER — Ambulatory Visit (HOSPITAL_COMMUNITY)
Admission: RE | Admit: 2020-12-25 | Discharge: 2020-12-25 | Disposition: A | Discharge status: Home / Self Care | Payer: 59 | Attending: Internal Medicine | Admitting: Internal Medicine

## 2020-12-25 ENCOUNTER — Ambulatory Visit (HOSPITAL_COMMUNITY): Payer: 59 | Admitting: Anesthesiology

## 2020-12-25 DIAGNOSIS — Z7984 Long term (current) use of oral hypoglycemic drugs: Secondary | ICD-10-CM | POA: Insufficient documentation

## 2020-12-25 DIAGNOSIS — Z86010 Personal history of colonic polyps: Secondary | ICD-10-CM

## 2020-12-25 DIAGNOSIS — D124 Benign neoplasm of descending colon: Secondary | ICD-10-CM | POA: Diagnosis not present

## 2020-12-25 DIAGNOSIS — Z881 Allergy status to other antibiotic agents status: Secondary | ICD-10-CM | POA: Diagnosis not present

## 2020-12-25 DIAGNOSIS — D122 Benign neoplasm of ascending colon: Secondary | ICD-10-CM | POA: Diagnosis not present

## 2020-12-25 DIAGNOSIS — Z79899 Other long term (current) drug therapy: Secondary | ICD-10-CM | POA: Insufficient documentation

## 2020-12-25 DIAGNOSIS — D175 Benign lipomatous neoplasm of intra-abdominal organs: Secondary | ICD-10-CM | POA: Diagnosis not present

## 2020-12-25 DIAGNOSIS — Z1211 Encounter for screening for malignant neoplasm of colon: Secondary | ICD-10-CM | POA: Insufficient documentation

## 2020-12-25 DIAGNOSIS — Z8249 Family history of ischemic heart disease and other diseases of the circulatory system: Secondary | ICD-10-CM | POA: Insufficient documentation

## 2020-12-25 DIAGNOSIS — Z7951 Long term (current) use of inhaled steroids: Secondary | ICD-10-CM | POA: Diagnosis not present

## 2020-12-25 DIAGNOSIS — Z8371 Family history of colonic polyps: Secondary | ICD-10-CM | POA: Insufficient documentation

## 2020-12-25 DIAGNOSIS — Z882 Allergy status to sulfonamides status: Secondary | ICD-10-CM | POA: Insufficient documentation

## 2020-12-25 DIAGNOSIS — Z8349 Family history of other endocrine, nutritional and metabolic diseases: Secondary | ICD-10-CM | POA: Diagnosis not present

## 2020-12-25 DIAGNOSIS — K635 Polyp of colon: Secondary | ICD-10-CM | POA: Diagnosis not present

## 2020-12-25 DIAGNOSIS — Z87891 Personal history of nicotine dependence: Secondary | ICD-10-CM | POA: Insufficient documentation

## 2020-12-25 HISTORY — PX: POLYPECTOMY: SHX5525

## 2020-12-25 HISTORY — PX: COLONOSCOPY WITH PROPOFOL: SHX5780

## 2020-12-25 LAB — GLUCOSE, CAPILLARY: Glucose-Capillary: 94 mg/dL (ref 70–99)

## 2020-12-25 SURGERY — COLONOSCOPY WITH PROPOFOL
Anesthesia: General

## 2020-12-25 MED ORDER — LACTATED RINGERS IV SOLN: INTRAVENOUS | Status: DC

## 2020-12-25 MED ORDER — PROPOFOL 10 MG/ML IV BOLUS
INTRAVENOUS | Status: DC | PRN
  Administered 2020-12-25: 125 ug/kg/min via INTRAVENOUS
  Administered 2020-12-25: 90 mg via INTRAVENOUS

## 2020-12-25 MED ORDER — PHENYLEPHRINE HCL (PRESSORS) 10 MG/ML IV SOLN
INTRAVENOUS | Status: DC | PRN
  Administered 2020-12-25: 80 ug via INTRAVENOUS

## 2020-12-25 MED ORDER — LIDOCAINE HCL (CARDIAC) PF 50 MG/5ML IV SOSY
PREFILLED_SYRINGE | INTRAVENOUS | Status: DC | PRN
  Administered 2020-12-25: 80 mg via INTRAVENOUS

## 2020-12-25 NOTE — Anesthesia Postprocedure Evaluation (Signed)
Anesthesia Post Note  Patient: Sabrina Beasley  Procedure(s) Performed: COLONOSCOPY WITH PROPOFOL POLYPECTOMY  Patient location during evaluation: Phase II Anesthesia Type: General Level of consciousness: awake and alert and oriented Pain management: pain level controlled Vital Signs Assessment: post-procedure vital signs reviewed and stable Respiratory status: spontaneous breathing and respiratory function stable Cardiovascular status: blood pressure returned to baseline and stable Postop Assessment: no apparent nausea or vomiting Anesthetic complications: no   No notable events documented.   Last Vitals:  Vitals:   12/25/20 1139 12/25/20 1151  BP: (!) 94/53 105/64  Pulse: 88   Resp: 18   Temp: 36.8 C   SpO2: 97%     Last Pain:  Vitals:   12/25/20 1139  TempSrc: Oral  PainSc: 0-No pain                 Mariama Saintvil C Keni Wafer

## 2020-12-25 NOTE — Progress Notes (Signed)
Pt's procedure was cancelled because of oxygen saturation remaining low in the low to mid 80's on 3L of oxygen.No complaints of SOB. Pt received a duoneb per Dr. Charna Elizabeth. Pt states she only wears oxygen at bedtime. She was advised to go to ED to evaluate respiratory status. Pt refused, but did agree to f/u with her primary care doctor concerning this. Pt left our facility on 3 liters of oxygen to home. No complaints of shortness of breath.

## 2020-12-25 NOTE — Op Note (Signed)
[facility] Patient Name: Sabrina Beasley Procedure Date: 12/25/2020 10:36 AM MRN: [id number] Date of Birth: 1960-10-11 Attending MD: Norvel Richards , MD CSN: [id number] Age: 60 Admit Type: Outpatient Procedure:                Colonoscopy Indications:              High risk colon cancer surveillance: Personal                            history of colonic polyps Providers:                Norvel Richards, MD, Janeece Riggers, RN, Dereck Leep, Technician Referring MD:              Medicines:                Propofol per Anesthesia Complications:            No immediate complications. Estimated Blood Loss:     Estimated blood loss was minimal. Procedure:                Pre-Anesthesia Assessment:                           - Prior to the procedure, a History and Physical                            was performed, and patient medications and                            allergies were reviewed. The patient's tolerance of                            previous anesthesia was also reviewed. The risks                            and benefits of the procedure and the sedation                            options and risks were discussed with the patient.                            All questions were answered, and informed consent                            was obtained. Prior Anticoagulants: The patient has                            taken no previous anticoagulant or antiplatelet                            agents. ASA Grade Assessment: III - A patient with  severe systemic disease. After reviewing the risks                            and benefits, the patient was deemed in                            satisfactory condition to undergo the procedure.                           After obtaining informed consent, the colonoscope                            was passed under direct vision. Throughout the                            procedure,  the patient's blood pressure, pulse, and                            oxygen saturations were monitored continuously. The                            [phone]) scope was introduced through the                            anus and advanced to the the cecum, identified by                            appendiceal orifice and ileocecal valve. The                            colonoscopy was performed without difficulty. The                            patient tolerated the procedure well. The quality                            of the bowel preparation was adequate. Scope In: 11:14:05 AM Scope Out: 11:33:47 AM Scope Withdrawal Time: 0 hours 14 minutes 39 seconds  Total Procedure Duration: 0 hours 19 minutes 42 seconds  Findings:      The perianal and digital rectal examinations were normal.      Three semi-pedunculated polyps were found in the descending colon and       ascending colon. The polyps were 5 to 8 mm in size. These polyps were       removed with a cold snare. Resection and retrieval were complete.       Estimated blood loss was minimal.      A 10 mm polyp was found in the ascending colon. The polyp was       semi-pedunculated. 2 cm yellowish submucosal nodule just distal to the       ileocecal valve. Positive pillow sign.      The exam was otherwise without abnormality on direct and retroflexion       views. Impression:               - Three 5 to  8 mm polyps in the descending colon                            and in the ascending colon, removed with a cold                            snare. Resected and retrieved.                           - One 10 mm polyp in the ascending colon. Removed                            with hot snare. Ascending colon lipoma.                           - The examination was otherwise normal on direct                            and retroflexion views. Moderate Sedation:      Moderate (conscious) sedation was personally administered by an        anesthesia professional. The following parameters were monitored: oxygen       saturation, heart rate, blood pressure, respiratory rate, EKG, adequacy       of pulmonary ventilation, and response to care. Recommendation:           - Patient has a contact number available for                            emergencies. The signs and symptoms of potential                            delayed complications were discussed with the                            patient. Return to normal activities tomorrow.                            Written discharge instructions were provided to the                            patient.                           - Resume previous diet.                           - Continue present medications.                           - Repeat colonoscopy date to be determined after                            pending pathology results are reviewed for  surveillance.                           - Return to GI office (date not yet determined). Procedure Code(s):        --- Professional ---                           [phone], Colonoscopy, flexible; with removal of                            tumor(s), polyp(s), or other lesion(s) by snare                            technique Diagnosis Code(s):        --- Professional ---                           K63.5, Polyp of colon                           Z86.010, Personal history of colonic polyps CPT copyright 2019 [employer]. All rights reserved. The codes documented in this report are preliminary and upon coder review may  be revised to meet current compliance requirements. Cristopher Estimable. Contina Strain, MD Norvel Richards, MD 12/25/2020 11:43:16 AM This report has been signed electronically. Number of Addenda: 0

## 2020-12-25 NOTE — H&P (Signed)
$'@LOGO'z$ @   Primary Care Physician:  Coolidge Breeze, FNP Primary Gastroenterologist:  Dr. Gala Romney  Pre-Procedure History & Physical: HPI:  Sabrina Beasley is a 60 y.o. female here for surveillance colonoscopy.  History of colonic polyps removed elsewhere.  She is here for surveillance colonoscopy.  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  She has been cleared by her neurosurgeon to have this examination today.  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)    Bulging of cervical intervertebral disc    C4-5 &  C6-7   Constipation    DDD (degenerative disc disease), cervical    stenosis   Depression    Family history of premature CAD    GERD (gastroesophageal reflux disease)    Gout    History of endometriosis    History of kidney stones     right side w associated n/v   History of ovarian cyst    History of rheumatic fever    Hx of hepatitis C dx 04-16-2009 liver bx   mild portal/ periportal fibrosis-- lower serum level detection positive antibody virus RNA   Hyperlipidemia    OA (osteoarthritis)    Osteoporosis    PONV (postoperative nausea and vomiting)    pt states scopalomine patches help   Pre-diabetes    Right ureteral stone     Past Surgical History:  Procedure Laterality Date   CARDIOVASCULAR STRESS TEST  06/19/2015  dr Bronson Ing   normal nuclear study, low risk w/ overall LVSF normal and wall motion normal , stress ef 57%   CARPAL TUNNEL RELEASE Right x2  last one   CARPAL TUNNEL RELEASE Left 02/23/2013   Procedure: CARPAL TUNNEL RELEASE;  Surgeon: Carole Civil, MD;  Location: [facility];  Service: Orthopedics;  Laterality: Left;   CHOLECYSTECTOMY  1999   COLONOSCOPY  01/2012   Dr. Posey Pronto: tortous colon, multiple colon polyps, 2 cauterized in ascending colon, one transverse colon adenomatous, sigmoid polyps (6) hyperplastic.    CYSTOSCOPY W/ RETROGRADES Right 05/07/2016   Procedure:  CYSTOSCOPY WITH RETROGRADE PYELOGRAM;  Surgeon: Festus Aloe, MD;  Location: [facility];  Service: Urology;  Laterality: Right;   DX LAPAROSCOPY/  D & C HYSTEROSCOPY  2001   ESOPHAGOGASTRODUODENOSCOPY  05/2012   Dr. Posey Pronto: esophagitis, gastritis, no hpylori   LAPAROSCOPIC SALPINGO OOPHERECTOMY Right 08/24/2006   ORIF ANKLE FRACTURE Left    RECTOCELE REPAIR N/A 01/13/2016   Procedure: POSTERIOR VAGINAL REPAIR (RECTOCELE);  Surgeon: Jonnie Kind, MD;  Location: [facility];  Service: Gynecology;  Laterality: N/A;   TOTAL ABDOMINAL HYSTERECTOMY Left 05/22/2002   w/  Left Salpingo Oopherectomy   TRANSTHORACIC ECHOCARDIOGRAM  06/19/2015   ef 60-65%/  trivial MR and PR   TUBAL LIGATION  1989    Prior to Admission medications   Medication Sig Start Date End Date Taking? Authorizing Provider  albuterol (VENTOLIN HFA) 108 (90 Base) MCG/ACT inhaler Inhale 1-2 puffs into the lungs every 4 (four) hours as needed for shortness of breath or wheezing. 06/17/20  Yes [provider]  butalbital-acetaminophen-caffeine (FIORICET, ESGIC) 50-325-40 MG tablet Take 1 tablet by mouth at bedtime. 11/14/17  Yes [provider]  Cholecalciferol (VITAMIN D3) 50 MCG (2000 UT) capsule Take 2,000 Units by mouth daily. 07/14/20  Yes [provider]  DEXILANT 60 MG capsule Take 1 capsule by mouth once daily 07/28/18  Yes Annitta Needs, NP  diazepam (VALIUM) 5 MG tablet Place 1 tablet vaginally nightly as needed for muscle spasm/ pelvic pain. 06/24/20  Yes Jaquita Folds, MD  DULoxetine (CYMBALTA) 30 MG capsule Take 30 mg by mouth in the morning. 30 mg + 60 mg=90 mg 06/30/20  Yes [provider]  DULoxetine (CYMBALTA) 60 MG capsule Take 60 mg by mouth in the morning. 60 mg + 30 mg=90 mg 06/08/17  Yes [provider]  fenofibrate (TRICOR) 145 MG tablet Take 145 mg by mouth at bedtime. 10/16/20  Yes [provider]  hydrochlorothiazide (MICROZIDE) 12.5 MG  capsule Take 12.5 mg by mouth in the morning. 06/08/18  Yes [provider]  hydrOXYzine (VISTARIL) 25 MG capsule Take 25 mg by mouth at bedtime.   Yes [provider]  levocetirizine (XYZAL) 5 MG tablet Take 5 mg by mouth every evening.   Yes [provider]  lisinopril (PRINIVIL,ZESTRIL) 40 MG tablet Take 40 mg by mouth in the morning. 04/27/18  Yes [provider]  metFORMIN (GLUCOPHAGE-XR) 500 MG 24 hr tablet Take 500 mg by mouth daily with supper.   Yes [provider]  Multiple Vitamin (MULTIVITAMIN WITH MINERALS) TABS tablet Take 1 tablet by mouth in the morning.   Yes [provider]  ondansetron (ZOFRAN) 8 MG tablet Take 8 mg by mouth 3 (three) times daily as needed for nausea. 06/27/19  Yes [provider]  QUEtiapine (SEROQUEL) 100 MG tablet Take 100 mg by mouth daily with supper.   Yes [provider]  simvastatin (ZOCOR) 40 MG tablet Take 40 mg by mouth at bedtime.   Yes [provider]  traZODone (DESYREL) 150 MG tablet Take 150 mg by mouth at bedtime.   Yes [provider]  mirabegron ER (MYRBETRIQ) 25 MG TB24 tablet Take 1 tablet (25 mg total) by mouth daily. Patient not taking: No sig reported 06/24/20   Jaquita Folds, MD  polyethylene glycol-electrolytes (NULYTELY) 420 g solution As directed 10/28/20   Ferne Ellingwood, Cristopher Estimable, MD  Probiotic TBEC Take 1 capsule by mouth in the morning.    [provider]    Allergies as of 10/28/2020 - Review Complete 08/26/2020  Allergen Reaction Noted   Nitrofurantoin monohyd macro Nausea And Vomiting 11/03/2010   Sulfa antibiotics Rash 06/12/2015    Family History  Problem Relation Age of Onset   Hypertension Mother    Hyperlipidemia Mother    Heart disease Father    Heart disease Brother        passed age 65 from heart valve complication of some sort   Colon polyps Brother    Colon polyps Brother        deceased age 12 from "unknown"    Colon cancer Neg Hx     Social History   Socioeconomic History   Marital status: Divorced    Spouse name: Not on file   Number of children: Not on file   Years of education: Not on file   Highest education level: Not on file  Occupational History   Not on file  Tobacco Use   Smoking status: Former    Packs/day: 0.50    Years: 10.00    Pack years: 5.00    Types: Cigarettes    Start date: 05/10/1978    Quit date: 12/14/2017    Years since quitting: 3.0   Smokeless tobacco: Never  Vaping Use   Vaping Use: Never used  Substance and Sexual Activity   Alcohol use: No  Alcohol/week: 0.0 standard drinks   Drug use: No   Sexual activity: Not Currently    Partners: Male    Birth control/protection: Surgical  Other Topics Concern   Not on file  Social History Narrative   Not on file   Social Determinants of Health   Financial Resource Strain: Not on file  Food Insecurity: Not on file  Transportation Needs: Not on file  Physical Activity: Not on file  Stress: Not on file  Social Connections: Not on file  Intimate Partner Violence: Not on file    Review of Systems: See HPI, otherwise negative ROS  Physical Exam: BP 115/73   Pulse 99   Temp 98.8 F (37.1 C) (Oral)   Resp 16   SpO2 100%  General:   Alert,  Well-developed, well-nourished, pleasant and cooperative in NAD Neck:  Supple; no masses or thyromegaly. No significant cervical adenopathy. Lungs:  Clear throughout to auscultation.   No wheezes, crackles, or rhonchi. No acute distress. Heart:  Regular rate and rhythm; no murmurs, clicks, rubs,  or gallops. Abdomen: Non-distended, normal bowel sounds.  Soft and nontender without appreciable mass or hepatosplenomegaly.  Pulses:  Normal pulses noted. Extremities:  Without clubbing or edema.  Impression/Plan: 60 year old lady history [place]  Here for surveillance colonoscopy.  History of her subarachnoid  hemorrhage previously.  Followed up with her neurosurgeon.  She is cleared for colonoscopy today.  I have offered the patient a surveillance colonoscopy today. The risks, benefits, limitations, alternatives and imponderables have been reviewed with the patient. Questions have been answered. All parties are agreeable.       Notice: This dictation was prepared with Dragon dictation along with smaller phrase technology. Any transcriptional errors that result from this process are unintentional and may not be corrected upon review.

## 2020-12-25 NOTE — Discharge Instructions (Signed)
  Colonoscopy Discharge Instructions  Read the instructions outlined below and refer to this sheet in the next few weeks. These discharge instructions provide you with general information on caring for yourself after you leave the hospital. Your doctor may also give you specific instructions. While your treatment has been planned according to the most current medical practices available, unavoidable complications occasionally occur. If you have any problems or questions after discharge, call Dr. Gala Romney at [phone]. ACTIVITY You may resume your regular activity, but move at a slower pace for the next 24 hours.  Take frequent rest periods for the next 24 hours.  Walking will help get rid of the air and reduce the bloated feeling in your belly (abdomen).  No driving for 24 hours (because of the medicine (anesthesia) used during the test).   Do not sign any important legal documents or operate any machinery for 24 hours (because of the anesthesia used during the test).  NUTRITION Drink plenty of fluids.  You may resume your normal diet as instructed by your doctor.  Begin with a light meal and progress to your normal diet. Heavy or fried foods are harder to digest and may make you feel sick to your stomach (nauseated).  Avoid alcoholic beverages for 24 hours or as instructed.  MEDICATIONS You may resume your normal medications unless your doctor tells you otherwise.  WHAT YOU CAN EXPECT TODAY Some feelings of bloating in the abdomen.  Passage of more gas than usual.  Spotting of blood in your stool or on the toilet paper.  IF YOU HAD POLYPS REMOVED DURING THE COLONOSCOPY: No aspirin products for 7 days or as instructed.  No alcohol for 7 days or as instructed.  Eat a soft diet for the next 24 hours.  FINDING OUT THE RESULTS OF YOUR TEST Not all test results are available during your visit. If your test results are not back during the visit, make an appointment with your caregiver to find out the  results. Do not assume everything is normal if you have not heard from your caregiver or the medical facility. It is important for you to follow up on all of your test results.  SEEK IMMEDIATE MEDICAL ATTENTION IF: You have more than a spotting of blood in your stool.  Your belly is swollen (abdominal distention).  You are nauseated or vomiting.  You have a temperature over 101.  You have abdominal pain or discomfort that is severe or gets worse throughout the day.     4 polyps removed in your colon today  Further recommendations to follow pending review of pathology report  At patient request, called Eddie Dibbles at [phone].  Got voicemail.  Left a message.

## 2020-12-25 NOTE — Anesthesia Preprocedure Evaluation (Signed)
Anesthesia Evaluation  Patient identified by MRN, date of birth, ID band Patient awake    Reviewed: Allergy & Precautions, NPO status , Patient's Chart, lab work & pertinent test results  History of Anesthesia Complications (+) PONV and history of anesthetic complications  Airway Mallampati: II  TM Distance: >3 FB Neck ROM: Full   Comment: Bulging of cervical intervertebral disc  C4-5 &  C6-7  Dental  (+) Dental Advisory Given, Missing Crowns :   Pulmonary Patient abstained from smoking., former smoker,    Pulmonary exam normal breath sounds clear to auscultation       Cardiovascular Exercise Tolerance: Good hypertension, Pt. on medications Normal cardiovascular exam Rhythm:Regular Rate:Normal     Neuro/Psych PSYCHIATRIC DISORDERS Anxiety Depression  Neuromuscular disease (Bulging of cervical intervertebral disc  C4-5 &  C6-7 ) CVA (SAH, aneurysm rupture)    GI/Hepatic GERD  Medicated and Controlled,(+) Hepatitis -, C  Endo/Other  diabetes, Well Controlled, Type 2, Oral Hypoglycemic Agents  Renal/GU      Musculoskeletal  (+) Arthritis  (Bulging of cervical intervertebral disc  C4-5 &  C6-7 ),   Abdominal   Peds  Hematology   Anesthesia Other Findings   Reproductive/Obstetrics                           Anesthesia Physical Anesthesia Plan  ASA: 3  Anesthesia Plan: General   Post-op Pain Management:    Induction: Intravenous  PONV Risk Score and Plan: TIVA  Airway Management Planned: Nasal Cannula and Natural Airway  Additional Equipment:   Intra-op Plan:   Post-operative Plan:   Informed Consent: I have reviewed the patients History and Physical, chart, labs and discussed the procedure including the risks, benefits and alternatives for the proposed anesthesia with the patient or authorized representative who has indicated his/her understanding and acceptance.     Dental  advisory given  Plan Discussed with: CRNA and Surgeon  Anesthesia Plan Comments:        Anesthesia Quick Evaluation

## 2020-12-25 NOTE — Transfer of Care (Signed)
Immediate Anesthesia Transfer of Care Note  Patient: Sabrina Beasley  Procedure(s) Performed: COLONOSCOPY WITH PROPOFOL POLYPECTOMY  Patient Location: Short Stay  Anesthesia Type:General  Level of Consciousness: awake, alert , oriented and patient cooperative  Airway & Oxygen Therapy: Patient Spontanous Breathing  Post-op Assessment: Report given to RN, Post -op Vital signs reviewed and stable and Patient moving all extremities X 4  Post vital signs: Reviewed and stable  Last Vitals:  Vitals Value Taken Time  BP    Temp    Pulse    Resp    SpO2      Last Pain:  Vitals:   12/25/20 0947  TempSrc: Oral  PainSc: 0-No pain      Patients Stated Pain Goal: 8 ([id number])  Complications: No notable events documented.

## 2020-12-26 LAB — SURGICAL PATHOLOGY

## 2020-12-27 ENCOUNTER — Encounter: Payer: Self-pay | Admitting: Internal Medicine

## 2020-12-27 NOTE — Progress Notes (Signed)
done

## 2021-01-05 ENCOUNTER — Encounter (HOSPITAL_COMMUNITY): Payer: Self-pay | Admitting: Internal Medicine

## 2021-03-18 DIAGNOSIS — H903 Sensorineural hearing loss, bilateral: Secondary | ICD-10-CM | POA: Insufficient documentation

## 2021-06-12 ENCOUNTER — Encounter (HOSPITAL_COMMUNITY): Payer: Self-pay

## 2021-06-12 ENCOUNTER — Other Ambulatory Visit: Payer: Self-pay

## 2021-06-12 ENCOUNTER — Telehealth: Payer: Self-pay | Admitting: Internal Medicine

## 2021-06-12 ENCOUNTER — Emergency Department (HOSPITAL_COMMUNITY)
Admission: EM | Admit: 2021-06-12 | Discharge: 2021-06-12 | Disposition: A | Discharge status: Home / Self Care | Payer: 59 | Attending: Emergency Medicine | Admitting: Emergency Medicine

## 2021-06-12 ENCOUNTER — Emergency Department (HOSPITAL_COMMUNITY): Payer: 59

## 2021-06-12 DIAGNOSIS — R1013 Epigastric pain: Secondary | ICD-10-CM | POA: Insufficient documentation

## 2021-06-12 DIAGNOSIS — I10 Essential (primary) hypertension: Secondary | ICD-10-CM | POA: Diagnosis not present

## 2021-06-12 DIAGNOSIS — R079 Chest pain, unspecified: Secondary | ICD-10-CM | POA: Diagnosis not present

## 2021-06-12 DIAGNOSIS — Z79899 Other long term (current) drug therapy: Secondary | ICD-10-CM | POA: Insufficient documentation

## 2021-06-12 LAB — CBC
HCT: 34.8 % — ABNORMAL LOW (ref 36.0–46.0)
Hemoglobin: 12.4 g/dL (ref 12.0–15.0)
MCH: 31.2 pg (ref 26.0–34.0)
MCHC: 35.6 g/dL (ref 30.0–36.0)
MCV: 87.7 fL (ref 80.0–100.0)
Platelets: 453 10*3/uL — ABNORMAL HIGH (ref 150–400)
RBC: 3.97 MIL/uL (ref 3.87–5.11)
RDW: 12.8 % (ref 11.5–15.5)
WBC: 14.6 10*3/uL — ABNORMAL HIGH (ref 4.0–10.5)
nRBC: 0 % (ref 0.0–0.2)

## 2021-06-12 LAB — COMPREHENSIVE METABOLIC PANEL
ALT: 23 U/L (ref 0–44)
AST: 23 U/L (ref 15–41)
Albumin: 3.6 g/dL (ref 3.5–5.0)
Alkaline Phosphatase: 50 U/L (ref 38–126)
Anion gap: 9 (ref 5–15)
BUN: 18 mg/dL (ref 6–20)
CO2: 28 mmol/L (ref 22–32)
Calcium: 9.6 mg/dL (ref 8.9–10.3)
Chloride: 101 mmol/L (ref 98–111)
Creatinine, Ser: 1.05 mg/dL — ABNORMAL HIGH (ref 0.44–1.00)
GFR, Estimated: >60 mL/min (ref >60)
Glucose, Bld: 111 mg/dL — ABNORMAL HIGH (ref 70–99)
Potassium: 3 mmol/L — ABNORMAL LOW (ref 3.5–5.1)
Sodium: 138 mmol/L (ref 135–145)
Total Bilirubin: 0.3 mg/dL (ref 0.3–1.2)
Total Protein: 6.5 g/dL (ref 6.5–8.1)

## 2021-06-12 LAB — LIPASE, BLOOD: Lipase: 57 U/L — ABNORMAL HIGH (ref 11–51)

## 2021-06-12 LAB — TROPONIN I (HIGH SENSITIVITY): Troponin I (High Sensitivity): 4 ng/L (ref <18)

## 2021-06-12 MED ORDER — ALUM & MAG HYDROXIDE-SIMETH 200-200-20 MG/5ML PO SUSP
30.0000 mL | Freq: Once | ORAL | Status: AC
  Administered 2021-06-12: 30 mL via ORAL
  Filled 2021-06-12: qty 30

## 2021-06-12 MED ORDER — IOHEXOL 300 MG/ML  SOLN
100.0000 mL | Freq: Once | INTRAMUSCULAR | Status: AC | PRN
  Administered 2021-06-12: 100 mL via INTRAVENOUS

## 2021-06-12 MED ORDER — OMEPRAZOLE 20 MG PO CPDR: 20.0000 mg | DELAYED_RELEASE_CAPSULE | Freq: Every day | ORAL | 0 refills | Status: DC

## 2021-06-12 MED ORDER — FAMOTIDINE 20 MG PO TABS
20.0000 mg | ORAL_TABLET | Freq: Once | ORAL | Status: AC
  Administered 2021-06-12: 20 mg via ORAL
  Filled 2021-06-12: qty 1

## 2021-06-12 MED ORDER — DICYCLOMINE HCL 20 MG PO TABS: 20.0000 mg | ORAL_TABLET | Freq: Two times a day (BID) | ORAL | 0 refills | Status: DC | PRN

## 2021-06-12 NOTE — Telephone Encounter (Signed)
Patient needs to be evaluated for abdominal pain. She was not reported pain when we last saw her 6 months ago. I would suggest if pain is significant that she go to the ER for evaluation.   You can offer an ov for next week but she should be evaluated by a provider before then if significant pain, shortness of breath, fever, or unable to keep food/liquid down or notes decreased urination.

## 2021-06-12 NOTE — Telephone Encounter (Signed)
Pt called stating that she is having sharp pains from in between her breasts down to the pit of her stomach. Pt is also complaining of left side pain. Pt states that she is having diarrhea almost immediately after she eats as well as nausea, and that this has been going on for the last few days. Pt is currently on dexilant once daily and probiotic once daily.

## 2021-06-12 NOTE — Telephone Encounter (Signed)
Pt was given an appt Mon at 11am

## 2021-06-12 NOTE — Telephone Encounter (Signed)
Pt was made aware and verbalized understanding.  

## 2021-06-12 NOTE — Discharge Instructions (Signed)
Lab work imaging reassuring likely this is viral gastritis, given you an acid pill please take as prescribed also give you Bentyl for stomach spasms recommend a bland diet for the next couple days as you tolerate this you may advance your diet.  Please follow your PCP as needed  Come back to the emergency department if you develop chest pain, shortness of breath, severe abdominal pain, uncontrolled nausea, vomiting, diarrhea.

## 2021-06-12 NOTE — Telephone Encounter (Signed)
[phone] please call patient, she is having abdominal pain

## 2021-06-12 NOTE — ED Triage Notes (Addendum)
CP x1wk that goes to epigastric area. Also pain on the left side. Pt report loose stools x1week.

## 2021-06-12 NOTE — ED Provider Notes (Signed)
[facility] Provider Note   CSN: [id number] Arrival date & time: 06/12/21  1653     History  Chief Complaint  Patient presents with   Chest Pain    Sabrina Beasley is a 61 y.o. female.  HPI  Patient with medical history including hypertension, anxiety, hep C presents with complaints of epigastric tenderness.  Patient states pain started 1 week ago, came on after she was eating, pain remains in her epigastric region but will occasionally feel it going down her stomach into her suprapubic region, pain is intermittent worse after eating or drinking pain is [place], Describes a Pressure-Like Sensation Denies It Radiating to Her Back, denies  Tearing like like Sensation or Stabbing-Like Sensation Has Associated Nausea without Vomiting Laying down Tends to Make the Pain Better Stand Makes the Pain Worse No Associated Constipation or Diarrhea No Recent Alcohol Use or NSAID Use No Stomach Ulcer History, Patient Never Had This in the past, abdominal history includes hysterectomy cholecystectomy tubal ligation and recent colonoscopy were a few polyps were removed.  Patient is having shoulder chest pain or shortness of breath become diaphoretic.   Home Medications Prior to Admission medications   Medication Sig Start Date End Date Taking? Authorizing Provider  dicyclomine (BENTYL) 20 MG tablet Take 1 tablet (20 mg total) by mouth 2 (two) times daily as needed for spasms. 06/12/21  Yes Marcello Fennel, PA-C  omeprazole (PRILOSEC) 20 MG capsule Take 1 capsule (20 mg total) by mouth daily. 06/12/21 07/12/21 Yes Marcello Fennel, PA-C  albuterol (VENTOLIN HFA) 108 (90 Base) MCG/ACT inhaler Inhale 1-2 puffs into the lungs every 4 (four) hours as needed for shortness of breath or wheezing. 06/17/20   [provider]  butalbital-acetaminophen-caffeine (FIORICET, ESGIC) 50-325-40 MG tablet Take 1 tablet by mouth at bedtime. 11/14/17   [provider]  Cholecalciferol  (VITAMIN D3) 50 MCG (2000 UT) capsule Take 2,000 Units by mouth daily. 07/14/20   [provider]  DEXILANT 60 MG capsule Take 1 capsule by mouth once daily 07/28/18   Annitta Needs, NP  diazepam (VALIUM) 5 MG tablet Place 1 tablet vaginally nightly as needed for muscle spasm/ pelvic pain. 06/24/20   Jaquita Folds, MD  DULoxetine (CYMBALTA) 30 MG capsule Take 30 mg by mouth in the morning. 30 mg + 60 mg=90 mg 06/30/20   [provider]  DULoxetine (CYMBALTA) 60 MG capsule Take 60 mg by mouth in the morning. 60 mg + 30 mg=90 mg 06/08/17   [provider]  fenofibrate (TRICOR) 145 MG tablet Take 145 mg by mouth at bedtime. 10/16/20   [provider]  hydrochlorothiazide (MICROZIDE) 12.5 MG capsule Take 12.5 mg by mouth in the morning. 06/08/18   [provider]  hydrOXYzine (VISTARIL) 25 MG capsule Take 25 mg by mouth at bedtime.    [provider]  levocetirizine (XYZAL) 5 MG tablet Take 5 mg by mouth every evening.    [provider]  lisinopril (PRINIVIL,ZESTRIL) 40 MG tablet Take 40 mg by mouth in the morning. 04/27/18   [provider]  metFORMIN (GLUCOPHAGE-XR) 500 MG 24 hr tablet Take 500 mg by mouth daily with supper.    [provider]  mirabegron ER (MYRBETRIQ) 25 MG TB24 tablet Take 1 tablet (25 mg total) by mouth daily. Patient not taking: No sig reported 06/24/20   Jaquita Folds, MD  Multiple Vitamin (MULTIVITAMIN WITH MINERALS) TABS tablet Take 1 tablet by mouth in  the morning.    [provider]  ondansetron (ZOFRAN) 8 MG tablet Take 8 mg by mouth 3 (three) times daily as needed for nausea. 06/27/19   [provider]  polyethylene glycol-electrolytes (NULYTELY) 420 g solution As directed 10/28/20   Rourk, Cristopher Estimable, MD  Probiotic TBEC Take 1 capsule by mouth in the morning.    [provider]  QUEtiapine (SEROQUEL) 100 MG tablet Take 100 mg by mouth daily with supper.     [provider]  simvastatin (ZOCOR) 40 MG tablet Take 40 mg by mouth at bedtime.    [provider]  traZODone (DESYREL) 150 MG tablet Take 150 mg by mouth at bedtime.    [provider]      Allergies    Corticosteroids, Nitrofurantoin monohyd macro, and Sulfa antibiotics    Review of Systems   Review of Systems  Constitutional:  Negative for chills and fever.  Respiratory:  Negative for shortness of breath.   Cardiovascular:  Negative for chest pain.  Gastrointestinal:  Positive for abdominal pain and nausea. Negative for constipation, diarrhea and vomiting.  Neurological:  Negative for headaches.   Physical Exam Updated Vital Signs BP 114/68    Pulse 84    Temp 99 F (37.2 C) (Oral)    Resp 18    SpO2 94%  Physical Exam Vitals and nursing note reviewed.  Constitutional:      General: She is not in acute distress.    Appearance: She is not ill-appearing.  HENT:     Head: Normocephalic and atraumatic.     Nose: No congestion.  Eyes:     Conjunctiva/sclera: Conjunctivae normal.  Cardiovascular:     Rate and Rhythm: Normal rate and regular rhythm.     Pulses: Normal pulses.     Heart sounds: No murmur heard.   No friction rub. No gallop.  Pulmonary:     Effort: No respiratory distress.     Breath sounds: No wheezing, rhonchi or rales.  Abdominal:     Palpations: Abdomen is soft.     Tenderness: There is abdominal tenderness. There is no right CVA tenderness or left CVA tenderness.     Comments: Abdomen nondistended normal bowel sounds dull to percussion, has tenderness in her epigastric region as well as her right upper quadrant no guarding, rebound tenderness, peritoneal sign negative Murphy sign McBurney point no CVA tenderness.  Musculoskeletal:     Right lower leg: No edema.     Left lower leg: No edema.  Skin:    General: Skin is warm and dry.  Neurological:     Mental Status: She is alert.  Psychiatric:        Mood and Affect: Mood  normal.    ED Results / Procedures / Treatments   Labs (all labs ordered are listed, but only abnormal results are displayed) Labs Reviewed  CBC - Abnormal; Notable for the following components:      Result Value   WBC 14.6 (*)    HCT 34.8 (*)    Platelets 453 (*)    All other components within normal limits  LIPASE, BLOOD - Abnormal; Notable for the following components:   Lipase 57 (*)    All other components within normal limits  COMPREHENSIVE METABOLIC PANEL - Abnormal; Notable for the following components:   Potassium 3.0 (*)    Glucose, Bld 111 (*)    Creatinine, Ser 1.05 (*)    All other components within normal limits  TROPONIN I (HIGH SENSITIVITY)    EKG EKG Interpretation  Date/Time:  Friday June 12 2021 17:16:30 EST Ventricular Rate:  117 PR Interval:  146 QRS Duration: 80 QT Interval:  320 QTC Calculation: 446 R Axis:   92 Text Interpretation: Sinus tachycardia Right atrial enlargement Possible Lateral infarct , age undetermined Abnormal ECG When compared with ECG of 22-Dec-2020 09:33, No significant change was found Confirmed by Nanda Quinton [phone]) on 06/12/2021 5:19:03 PM  Radiology DG Chest 2 View  Result Date: 06/12/2021 CLINICAL DATA:  Chest pain. EXAM: CHEST - 2 VIEW COMPARISON:  March 15, 2010 FINDINGS: No consolidation. No visible pleural effusions or pneumothorax. Cardiomediastinal silhouette is within normal limits and similar to prior. Cholecystectomy clips. IMPRESSION: No evidence of acute cardiopulmonary disease. Electronically Signed   By: Margaretha Sheffield M.D.   On: 06/12/2021 17:48   CT ABDOMEN PELVIS W CONTRAST  Result Date: 06/12/2021 CLINICAL DATA:  Epigastric pain. EXAM: CT ABDOMEN AND PELVIS WITH CONTRAST TECHNIQUE: Multidetector CT imaging of the abdomen and pelvis was performed using the standard protocol following bolus administration of intravenous contrast. RADIATION DOSE REDUCTION: This exam was performed according to the  departmental dose-optimization program which includes automated exposure control, adjustment of the mA and/or kV according to patient size and/or use of iterative reconstruction technique. CONTRAST:  1102mL OMNIPAQUE IOHEXOL 300 MG/ML  SOLN COMPARISON:  Abdominal ultrasound 09/04/2020, noncontrast CT 05/03/2016 FINDINGS: Lower chest: Minor subsegmental atelectasis without confluent consolidation or pleural effusion. Heart is normal in size. Hepatobiliary: Diffusely decreased hepatic density typical of steatosis. No focal liver lesion. Clips in the gallbladder fossa postcholecystectomy. No biliary dilatation. Pancreas: No ductal dilatation or inflammation. Spleen: Normal in size without focal abnormality. Adrenals/Urinary Tract: No adrenal nodule. No hydronephrosis or perinephric edema. Homogeneous renal enhancement with symmetric excretion on delayed phase imaging. Slight lobulated renal contours. No evidence of focal renal lesion or stone. Urinary bladder is partially distended without wall thickening. There is excreted IV contrast within the urinary bladder. Stomach/Bowel: Stomach physiologically distended. No small bowel obstruction or inflammation. Occasional fluid-filled small bowel that is not abnormally distended. Low lying cecum in the pelvis. The appendix is normal, coursing into the deep right pelvis, series 2, image 59. Small to moderate volume of colonic stool. The descending and sigmoid colon are redundant. Vascular/Lymphatic: Normal caliber abdominal aorta. Minor aortic atherosclerosis. Patent portal, splenic, and mesenteric veins. No abdominopelvic adenopathy. Reproductive: Hysterectomy.  No adnexal mass. Other: No free air, free fluid, or intra-abdominal fluid collection. No abdominal wall hernia. 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 There  are no acute or suspicious osseous abnormalities. IMPRESSION: 1. No acute abnormality in the abdomen/pelvis. 2. Hepatic steatosis. 3. 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 Aortic Atherosclerosis (ICD10-I70.0). Electronically Signed   By: Keith Rake M.D.   On: 06/12/2021 21:01    Procedures Procedures    Medications Ordered in ED Medications  alum & mag hydroxide-simeth (MAALOX/MYLANTA) 200-200-20 MG/5ML suspension 30 mL (30 mLs Oral Given 06/12/21 1746)  famotidine (PEPCID) tablet 20 mg (20 mg Oral Given 06/12/21 1746)  iohexol (OMNIPAQUE) 300 MG/ML solution 100 mL (100 mLs Intravenous Contrast Given 06/12/21 2032)    ED Course/ Medical Decision Making/ A&P                           Medical Decision  Making Amount and/or Complexity of Data Reviewed Labs: ordered. Radiology: ordered.  Risk OTC drugs. Prescription drug management.   This patient presents to the ED for concern of epigastric pain, this involves an extensive number of treatment options, and is a complaint that carries with it a high risk of complications and morbidity.  The differential diagnosis includes: Cholangitis, ruptured stomach ulcer, dissection    Additional history obtained:  Additional history obtained from husband who was at bedside    Co morbidities that complicate the patient evaluation  N/A  Social Determinants of Health:  N/A    Lab Tests:  I Ordered, and personally interpreted labs.  The pertinent results include: CBC shows leukocytosis of 14.6, CMP shows potassium 3.0 was 101 creatinine 1.05, lipase 57 for troponin is 4   Imaging Studies ordered:  I ordered imaging studies including chest x-ray, CT abdomen pelvis I independently visualized and interpreted imaging which showed chest x-ray unremarkable, CT ab pelvis negative for acute findings I agree with the radiologist interpretation   Cardiac Monitoring:  The patient was  maintained on a cardiac monitor.  I personally viewed and interpreted the cardiac monitored which showed an underlying rhythm of: EKG sinus tach without signs of ischemia   Medicines ordered and prescription drug management:  I ordered medication including GI detail, Pepcid for stomach pain I have reviewed the patients home medicines and have made adjustments as needed   Reevaluation:  Patient given GI cocktail as well as Pepcid she is reassessed states her pain has improved, still slight tenderness in her epigastric region, due to her slightly distended abdomen and significant abdominal history will obtain CT imaging for further evaluation  CT is negative for acute findings patient has no complaints at time vital signs remained stable she is agreeable for discharge   Rule out I have low suspicion for ACS as history is atypical, patient has no cardiac history, EKG  without signs of ischemia, first troponin was 4-second troponin will be deferred as patient has had no chest pain for greater than 12 hours would expect elevation in troponins if ACS was present.  Low suspicion for PE as patient denies pleuritic chest pain, shortness of breath, patient denies leg pain, no pedal edema noted on exam, vital signs reassuring nontachypneic nonhypoxic.  She did tachycardia but this is since resolved this like secondary due to pain.  Low suspicion for AAA or aortic dissection as history is atypical, patient has low risk factors.  Low suspicion for systemic infection as patient is nontoxic-appearing, vital signs reassuring, no obvious source infection noted on exam.  Low suspicion for liver or gallbladder abnormality she has no right upper quadrant tenderness, liver signs alk phos both within normal limits.  Low suspicion for pancreatitis lipase within normal limits, low suspicion for bowel obstruction, abdominal mass, abdominal infection, volvulus, diverticulitis as CT imaging negative for these  findings     Dispostion and problem list  After consideration of the diagnostic results and the patients response to treatment, I feel that the patent would benefit from an acid pill, bowel rest and discharge.  Epigastric pain-likely viral gastroenteritis will provide her with an acid pill, recommend bowel rest follow-up PCP as needed gave strict return precautions.            Final Clinical Impression(s) / ED Diagnoses Final diagnoses:  Epigastric pain    Rx / DC Orders ED Discharge Orders          Ordered    omeprazole (  PRILOSEC) 20 MG capsule  Daily        06/12/21 2128    dicyclomine (BENTYL) 20 MG tablet  2 times daily PRN        06/12/21 2128              Aron Baba 06/12/21 2130    Margette Fast, MD 06/23/21 [phone]

## 2021-06-15 ENCOUNTER — Ambulatory Visit: Payer: 59 | Admitting: Gastroenterology

## 2021-11-04 ENCOUNTER — Encounter (HOSPITAL_COMMUNITY): Payer: Self-pay

## 2021-11-04 ENCOUNTER — Other Ambulatory Visit (HOSPITAL_COMMUNITY): Payer: Self-pay | Admitting: Family Medicine

## 2021-11-04 ENCOUNTER — Inpatient Hospital Stay (HOSPITAL_COMMUNITY): Admission: RE | Admit: 2021-11-04 | Payer: 59 | Source: Ambulatory Visit

## 2021-11-04 DIAGNOSIS — Z1231 Encounter for screening mammogram for malignant neoplasm of breast: Secondary | ICD-10-CM

## 2021-11-08 ENCOUNTER — Other Ambulatory Visit: Payer: Self-pay

## 2021-11-08 ENCOUNTER — Ambulatory Visit
Admission: EM | Admit: 2021-11-08 | Discharge: 2021-11-08 | Disposition: A | Discharge status: Home / Self Care | Payer: 59 | Attending: Family Medicine | Admitting: Family Medicine

## 2021-11-08 ENCOUNTER — Encounter: Payer: Self-pay | Admitting: Emergency Medicine

## 2021-11-08 DIAGNOSIS — U071 COVID-19: Secondary | ICD-10-CM

## 2021-11-08 DIAGNOSIS — J209 Acute bronchitis, unspecified: Secondary | ICD-10-CM

## 2021-11-08 MED ORDER — PROMETHAZINE-DM 6.25-15 MG/5ML PO SYRP: 5.0000 mL | ORAL_SOLUTION | Freq: Four times a day (QID) | ORAL | 0 refills | Status: DC | PRN

## 2021-11-08 MED ORDER — ALBUTEROL SULFATE HFA 108 (90 BASE) MCG/ACT IN AERS: 1.0000 | INHALATION_SPRAY | Freq: Four times a day (QID) | RESPIRATORY_TRACT | 0 refills | Status: DC | PRN

## 2021-11-08 MED ORDER — DEXAMETHASONE SODIUM PHOSPHATE 10 MG/ML IJ SOLN
10.0000 mg | Freq: Once | INTRAMUSCULAR | Status: AC
  Administered 2021-11-08: 10 mg via INTRAMUSCULAR

## 2021-11-08 NOTE — ED Triage Notes (Signed)
Pt reports tested positive for covid on Thursday and states continued cough with intermittent pain to chest and back, nausea, ear pain, headache, and sore throat. Pt reports has zofran at home that has been taking and "hasn't really helped the nausea."

## 2021-11-08 NOTE — ED Provider Notes (Signed)
[facility]    CSN: [id number] Arrival date & time: 11/08/21  1019      History   Chief Complaint Chief Complaint  Patient presents with   Cough    HPI Sabrina Beasley is a 61 y.o. female.   Presenting today with ongoing and worsening cough, chest soreness with coughing, chest tightness, ear pain and pressure, nasal congestion, nausea, headache, sore throat for 5 to 6 days now.  Was diagnosed with COVID 4 days ago.  Denies chest pain, significant shortness of breath, abdominal pain, diarrhea.  Trying Zofran, Tylenol with minimal relief.  No known history of chronic pulmonary disease.    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)    Bulging of cervical intervertebral disc    C4-5 &  C6-7   Constipation    DDD (degenerative disc disease), cervical    stenosis   Depression    Family history of premature CAD    GERD (gastroesophageal reflux disease)    Gout    History of endometriosis    History of kidney stones     right side w associated n/v   History of ovarian cyst    History of rheumatic fever    Hx of hepatitis C dx 04-16-2009 liver bx   mild portal/ periportal fibrosis-- lower serum level detection positive antibody virus RNA   Hyperlipidemia    OA (osteoarthritis)    Osteoporosis    PONV (postoperative nausea and vomiting)    pt states scopalomine patches help   Pre-diabetes    Right ureteral stone     Patient Active Problem List   Diagnosis Date Noted   Anxiety 05/01/2019   Hypertension 05/01/2019   GERD (gastroesophageal reflux disease) 06/19/2018   Constipation 06/19/2018   Cerebral aneurysm 02/07/2018   SAH (subarachnoid hemorrhage) ([place]) 08/22/2017   Thrombocytosis 02/23/2017   Hx of hepatitis C 07/01/2016   Rectocele 12/23/2015   Diabetes mellitus without complication ([place]) 78/24/2353   Chest pain 01/10/2014   Hyperlipidemia 01/10/2014   S/P carpal tunnel release 02/26/2013    CTS (carpal tunnel syndrome) 02/14/2013   Arm numbness 11/06/2010    Past Surgical History:  Procedure Laterality Date   CARDIOVASCULAR STRESS TEST  06/19/2015  dr Bronson Ing   normal nuclear study, low risk w/ overall LVSF normal and wall motion normal , stress ef 57%   CARPAL TUNNEL RELEASE Right x2  last one   CARPAL TUNNEL RELEASE Left 02/23/2013   Procedure: CARPAL TUNNEL RELEASE;  Surgeon: Carole Civil, MD;  Location: [facility];  Service: Orthopedics;  Laterality: Left;   CHOLECYSTECTOMY  1999   COLONOSCOPY  01/2012   Dr. Posey Pronto: tortous colon, multiple colon polyps, 2 cauterized in ascending colon, one transverse colon adenomatous, sigmoid polyps (6) hyperplastic.    COLONOSCOPY WITH PROPOFOL N/A 12/25/2020   Procedure: COLONOSCOPY WITH PROPOFOL;  Surgeon: Daneil Dolin, MD;  Location: [facility];  Service: Endoscopy;  Laterality: N/A;  11:15am   CYSTOSCOPY W/ RETROGRADES Right 05/07/2016   Procedure: CYSTOSCOPY WITH RETROGRADE PYELOGRAM;  Surgeon: Festus Aloe, MD;  Location: [facility];  Service: Urology;  Laterality: Right;   DX LAPAROSCOPY/  D & C HYSTEROSCOPY  2001   ESOPHAGOGASTRODUODENOSCOPY  05/2012   Dr. Posey Pronto: esophagitis, gastritis, no hpylori   LAPAROSCOPIC SALPINGO OOPHERECTOMY Right 08/24/2006   ORIF ANKLE FRACTURE Left    POLYPECTOMY  12/25/2020   Procedure: POLYPECTOMY;  Surgeon: Daneil Dolin, MD;  [facility]: [facility];  Service: Endoscopy;;   RECTOCELE REPAIR N/A 01/13/2016   Procedure: POSTERIOR VAGINAL REPAIR (RECTOCELE);  Surgeon: Jonnie Kind, MD;  Location: [facility];  Service: Gynecology;  Laterality: N/A;   TOTAL ABDOMINAL HYSTERECTOMY Left 05/22/2002   w/  Left Salpingo Oopherectomy   TRANSTHORACIC ECHOCARDIOGRAM  06/19/2015   ef 60-65%/  trivial MR and PR   TUBAL LIGATION  1989    OB History     Gravida  3   Para  3   Term  3   Preterm      AB      Living         SAB      IAB      Ectopic       Multiple      Live Births               Home Medications    Prior to Admission medications   Medication Sig Start Date End Date Taking? Authorizing Provider  albuterol (VENTOLIN HFA) 108 (90 Base) MCG/ACT inhaler Inhale 1-2 puffs into the lungs every 6 (six) hours as needed for wheezing or shortness of breath. 11/08/21  Yes Volney American, PA-C  promethazine-dextromethorphan (PROMETHAZINE-DM) 6.25-15 MG/5ML syrup Take 5 mLs by mouth 4 (four) times daily as needed. 11/08/21  Yes Volney American, PA-C  albuterol (VENTOLIN HFA) 108 (90 Base) MCG/ACT inhaler Inhale 1-2 puffs into the lungs every 4 (four) hours as needed for shortness of breath or wheezing. 06/17/20   [provider]  butalbital-acetaminophen-caffeine (FIORICET, ESGIC) 50-325-40 MG tablet Take 1 tablet by mouth at bedtime. 11/14/17   [provider]  Cholecalciferol (VITAMIN D3) 50 MCG (2000 UT) capsule Take 2,000 Units by mouth daily. 07/14/20   [provider]  DEXILANT 60 MG capsule Take 1 capsule by mouth once daily 07/28/18   Annitta Needs, NP  diazepam (VALIUM) 5 MG tablet Place 1 tablet vaginally nightly as needed for muscle spasm/ pelvic pain. 06/24/20   Jaquita Folds, MD  dicyclomine (BENTYL) 20 MG tablet Take 1 tablet (20 mg total) by mouth 2 (two) times daily as needed for spasms. 06/12/21   Marcello Fennel, PA-C  DULoxetine (CYMBALTA) 30 MG capsule Take 30 mg by mouth in the morning. 30 mg + 60 mg=90 mg 06/30/20   [provider]  DULoxetine (CYMBALTA) 60 MG capsule Take 60 mg by mouth in the morning. 60 mg + 30 mg=90 mg 06/08/17   [provider]  fenofibrate (TRICOR) 145 MG tablet Take 145 mg by mouth at bedtime. 10/16/20   [provider]  hydrochlorothiazide (MICROZIDE) 12.5 MG capsule Take 12.5 mg by mouth in the morning. 06/08/18   [provider]  hydrOXYzine (VISTARIL) 25 MG capsule Take 25 mg by mouth at bedtime.    [provider]  levocetirizine (XYZAL) 5 MG tablet Take 5 mg by mouth every evening.    [provider]  lisinopril (PRINIVIL,ZESTRIL) 40 MG tablet Take 40 mg by mouth in the morning. 04/27/18   [provider]  metFORMIN (GLUCOPHAGE-XR) 500 MG 24 hr tablet Take 500 mg by mouth daily with supper.    [provider]  mirabegron ER (MYRBETRIQ) 25 MG TB24 tablet Take 1 tablet (25 mg total) by mouth daily. Patient not taking: No sig reported 06/24/20   Jaquita Folds, MD  Multiple Vitamin (MULTIVITAMIN WITH MINERALS) TABS tablet Take 1  tablet by mouth in the morning.    [provider]  omeprazole (PRILOSEC) 20 MG capsule Take 1 capsule (20 mg total) by mouth daily. 06/12/21 07/12/21  Marcello Fennel, PA-C  ondansetron (ZOFRAN) 8 MG tablet Take 8 mg by mouth 3 (three) times daily as needed for nausea. 06/27/19   [provider]  polyethylene glycol-electrolytes (NULYTELY) 420 g solution As directed 10/28/20   Rourk, Cristopher Estimable, MD  Probiotic TBEC Take 1 capsule by mouth in the morning.    [provider]  QUEtiapine (SEROQUEL) 100 MG tablet Take 100 mg by mouth daily with supper.    [provider]  simvastatin (ZOCOR) 40 MG tablet Take 40 mg by mouth at bedtime.    [provider]  traZODone (DESYREL) 150 MG tablet Take 150 mg by mouth at bedtime.    [provider]    Family History Family History  Problem Relation Age of Onset   Hypertension Mother    Hyperlipidemia Mother    Heart disease Father    Heart disease Brother        passed age 43 from heart valve complication of some sort   Colon polyps Brother    Colon polyps Brother        deceased age 59 from "unknown"   Colon cancer Neg Hx     Social History Social History   Tobacco Use   Smoking status: Former    Packs/day: 0.50    Years: 10.00    Total pack years: 5.00    Types: Cigarettes    Start date: 05/10/1978    Quit date: 12/14/2017    Years since  quitting: 3.9   Smokeless tobacco: Never  Vaping Use   Vaping Use: Never used  Substance Use Topics   Alcohol use: No    Alcohol/week: 0.0 standard drinks of alcohol   Drug use: No     Allergies   Corticosteroids, Nitrofurantoin monohyd macro, and Sulfa antibiotics   Review of Systems Review of Systems Per HPI  Physical Exam Triage Vital Signs ED Triage Vitals [11/08/21 1043]  Enc Vitals Group     BP 124/73     Pulse Rate (!) 108     Resp 18     Temp 98.9 F (37.2 C)     Temp Source Oral     SpO2 96 %     Weight      Height      Head Circumference      Peak Flow      Pain Score 6     Pain Loc      Pain Edu?      Excl. in [place]?    No data found.  Updated Vital Signs BP 124/73 (BP Location: Right Arm)   Pulse (!) 108   Temp 98.9 F (37.2 C) (Oral)   Resp 18   SpO2 96%   Visual Acuity Right Eye Distance:   Left Eye Distance:   Bilateral Distance:    Right Eye Near:   Left Eye Near:    Bilateral Near:     Physical Exam Vitals and nursing note reviewed.  Constitutional:      Appearance: Normal appearance. She is not ill-appearing.  HENT:     Head: Atraumatic.     Ears:     Comments: Mild middle ear effusions bilaterally    Nose: Rhinorrhea present.     Mouth/Throat:     Pharynx: Posterior oropharyngeal erythema present.  Eyes:     Extraocular Movements: Extraocular movements intact.     Conjunctiva/sclera: Conjunctivae normal.  Cardiovascular:     Rate and Rhythm: Normal rate and regular rhythm.     Heart sounds: Normal heart sounds.  Pulmonary:     Effort: Pulmonary effort is normal.     Breath sounds: Normal breath sounds. No wheezing or rales.  Musculoskeletal:        General: Normal range of motion.     Cervical back: Normal range of motion and neck supple.  Skin:    General: Skin is warm and dry.  Neurological:     Mental Status: She is alert and oriented to person, place, and time.  Psychiatric:        Mood and Affect: Mood normal.         Thought Content: Thought content normal.        Judgment: Judgment normal.    UC Treatments / Results  Labs (all labs ordered are listed, but only abnormal results are displayed) Labs Reviewed - No data to display  EKG   Radiology No results found.  Procedures Procedures (including critical care time)  Medications Ordered in UC Medications  dexamethasone (DECADRON) injection 10 mg (has no administration in time range)    Initial Impression / Assessment and Plan / UC Course  I have reviewed the triage vital signs and the nursing notes.  Pertinent labs & imaging results that were available during my care of the patient were reviewed by me and considered in my medical decision making (see chart for details).     Consistent with bronchitis secondary to COVID-19.  Discussed importance of taking over-the-counter cold and congestion medications, Mucinex, supportive measures and will add IM Decadron, Phenergan DM, albuterol inhaler for further support.  Return for worsening symptoms.  Final Clinical Impressions(s) / UC Diagnoses   Final diagnoses:  COVID-19  Acute bronchitis, unspecified organism   Discharge Instructions   None    ED Prescriptions     Medication Sig Dispense Auth. Provider   promethazine-dextromethorphan (PROMETHAZINE-DM) 6.25-15 MG/5ML syrup Take 5 mLs by mouth 4 (four) times daily as needed. 100 mL Volney American, PA-C   albuterol (VENTOLIN HFA) 108 (90 Base) MCG/ACT inhaler Inhale 1-2 puffs into the lungs every 6 (six) hours as needed for wheezing or shortness of breath. 18 g Volney American, [place]      PDMP not reviewed this encounter.   Volney American, [place] 11/08/21 1106

## 2021-11-11 ENCOUNTER — Ambulatory Visit (HOSPITAL_COMMUNITY): Payer: 59

## 2021-12-21 ENCOUNTER — Other Ambulatory Visit (HOSPITAL_COMMUNITY): Payer: Self-pay | Admitting: Family Medicine

## 2021-12-21 DIAGNOSIS — Z1231 Encounter for screening mammogram for malignant neoplasm of breast: Secondary | ICD-10-CM

## 2021-12-24 ENCOUNTER — Ambulatory Visit (HOSPITAL_COMMUNITY)
Admission: RE | Admit: 2021-12-24 | Discharge: 2021-12-24 | Disposition: A | Discharge status: Home / Self Care | Payer: 59 | Source: Ambulatory Visit | Attending: Family Medicine | Admitting: Family Medicine

## 2021-12-24 DIAGNOSIS — Z1231 Encounter for screening mammogram for malignant neoplasm of breast: Secondary | ICD-10-CM | POA: Insufficient documentation

## 2022-04-13 ENCOUNTER — Encounter: Payer: Self-pay | Admitting: Internal Medicine

## 2022-04-13 ENCOUNTER — Ambulatory Visit (INDEPENDENT_AMBULATORY_CARE_PROVIDER_SITE_OTHER): Payer: 59 | Admitting: Internal Medicine

## 2022-04-13 VITALS — BP 96/68 | HR 89 | Temp 98.2°F | Ht 59.0 in | Wt 159.0 lb

## 2022-04-13 DIAGNOSIS — Z86010 Personal history of colonic polyps: Secondary | ICD-10-CM | POA: Diagnosis not present

## 2022-04-13 DIAGNOSIS — K760 Fatty (change of) liver, not elsewhere classified: Secondary | ICD-10-CM

## 2022-04-13 DIAGNOSIS — K219 Gastro-esophageal reflux disease without esophagitis: Secondary | ICD-10-CM | POA: Diagnosis not present

## 2022-04-13 NOTE — Patient Instructions (Signed)
It was good to see you today!  To simplify your acid reflux treatment regimen I recommend the following:  Stop the Dexilant and omeprazole  Begin pantoprazole 40 mg p.o. 30 minutes before breakfast daily (dispense 30 with 11 refills)  Begin begin Benefiber 1 tablespoon spoon daily x 3 weeks; then increase to 2 tablespoons daily thereafter take the same time every day take every day where the feel you needed or not  For fatty liver, strive for an ideal body weight it would be nice if you could lose 10 pounds in the next 3 months  Aerobic exercise 3 times weekly.  Drinking coffee as you do daily actually has a beneficial effect on the liver.  Colonoscopy planned 2027.  Office visit here in 3 months

## 2022-04-13 NOTE — Progress Notes (Unsigned)
Primary Care Physician:  Coolidge Breeze, FNP Primary Gastroenterologist:  Dr. Gala Romney  Pre-Procedure History & Physical: HPI:  Sabrina Beasley is a 61 y.o. female here for follow-up of GERD and alternating constipation diarrhea.  Developed chest pain and diarrhea recently went to the ED are in [place].  Cardiac etiology ruled out.  Pain relieved with GI cocktail.  She is done well since that time.  She is for some reason taking both Dexilant and omeprazole daily denies dysphagia.  History of colonic and adenomas removed over time colonic adenomas removed last year; due for some surveillance colonoscopy 2025.  Not having any melena or rectal bleeding.  History of positive hepatitis C antibody but negative PCR.  Normal LFTs.  Elastography 3.1 kPa no evidence of portal hypertension on prior imaging.  No thrombocytopenia.  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)    Bulging of cervical intervertebral disc    C4-5 &  C6-7   Constipation    DDD (degenerative disc disease), cervical    stenosis   Depression    Family history of premature CAD    GERD (gastroesophageal reflux disease)    Gout    History of endometriosis    History of kidney stones     right side w associated n/v   History of ovarian cyst    History of rheumatic fever    Hx of hepatitis C dx 04-16-2009 liver bx   mild portal/ periportal fibrosis-- lower serum level detection positive antibody virus RNA   Hyperlipidemia    OA (osteoarthritis)    Osteoporosis    PONV (postoperative nausea and vomiting)    pt states scopalomine patches help   Pre-diabetes    Right ureteral stone     Past Surgical History:  Procedure Laterality Date   CARDIOVASCULAR STRESS TEST  06/19/2015  dr Bronson Ing   normal nuclear study, low risk w/ overall LVSF normal and wall motion normal , stress ef 57%   CARPAL TUNNEL RELEASE Right x2  last one   CARPAL TUNNEL RELEASE Left 02/23/2013    Procedure: CARPAL TUNNEL RELEASE;  Surgeon: Carole Civil, MD;  Location: [facility];  Service: Orthopedics;  Laterality: Left;   CHOLECYSTECTOMY  1999   COLONOSCOPY  01/2012   Dr. Posey Pronto: tortous colon, multiple colon polyps, 2 cauterized in ascending colon, one transverse colon adenomatous, sigmoid polyps (6) hyperplastic.    COLONOSCOPY WITH PROPOFOL N/A 12/25/2020   Procedure: COLONOSCOPY WITH PROPOFOL;  Surgeon: Daneil Dolin, MD;  [facility];  Service: Endoscopy;  Laterality: N/A;  11:15am   CYSTOSCOPY W/ RETROGRADES Right 05/07/2016   Procedure: CYSTOSCOPY WITH RETROGRADE PYELOGRAM;  Surgeon: Festus Aloe, MD;  [facility];  Service: Urology;  Laterality: Right;   DX LAPAROSCOPY/  D & C HYSTEROSCOPY  2001   ESOPHAGOGASTRODUODENOSCOPY  05/2012   Dr. Posey Pronto: esophagitis, gastritis, no hpylori   LAPAROSCOPIC SALPINGO OOPHERECTOMY Right 08/24/2006   ORIF ANKLE FRACTURE Left    POLYPECTOMY  12/25/2020   Procedure: POLYPECTOMY;  Surgeon: Daneil Dolin, MD;  [facility];  Service: Endoscopy;;   RECTOCELE REPAIR N/A 01/13/2016   Procedure: POSTERIOR VAGINAL REPAIR (RECTOCELE);  Surgeon: Jonnie Kind, MD;  Location: [facility];  Service: Gynecology;  Laterality: N/A;   TOTAL ABDOMINAL HYSTERECTOMY Left 05/22/2002   w/  Left Salpingo Oopherectomy   TRANSTHORACIC ECHOCARDIOGRAM  06/19/2015  ef 60-65%/  trivial MR and PR   TUBAL LIGATION  1989    Prior to Admission medications   Medication Sig Start Date End Date Taking? Authorizing Provider  albuterol (VENTOLIN HFA) 108 (90 Base) MCG/ACT inhaler Inhale 1-2 puffs into the lungs every 6 (six) hours as needed for wheezing or shortness of breath. 11/08/21  Yes Volney American, PA-C  butalbital-acetaminophen-caffeine (FIORICET, ESGIC) [phone] MG tablet Take 1 tablet by mouth at bedtime. 11/14/17  Yes [provider]  Cholecalciferol (VITAMIN D3) 50 MCG (2000 UT) capsule Take  2,000 Units by mouth daily. 07/14/20  Yes [provider]  DEXILANT 60 MG capsule Take 1 capsule by mouth once daily 07/28/18  Yes Annitta Needs, NP  dicyclomine (BENTYL) 20 MG tablet Take 1 tablet (20 mg total) by mouth 2 (two) times daily as needed for spasms. 06/12/21  Yes Marcello Fennel, PA-C  DULoxetine (CYMBALTA) 30 MG capsule Take 30 mg by mouth in the morning. 30 mg + 60 mg=90 mg 06/30/20  Yes [provider]  DULoxetine (CYMBALTA) 60 MG capsule Take 60 mg by mouth in the morning. 60 mg + 30 mg=90 mg 06/08/17  Yes [provider]  fenofibrate (TRICOR) 145 MG tablet Take 145 mg by mouth at bedtime. 10/16/20  Yes [provider]  hydrochlorothiazide (MICROZIDE) 12.5 MG capsule Take 12.5 mg by mouth in the morning. 06/08/18  Yes [provider]  hydrOXYzine (VISTARIL) 25 MG capsule Take 25 mg by mouth at bedtime.   Yes [provider]  levocetirizine (XYZAL) 5 MG tablet Take 5 mg by mouth every evening.   Yes [provider]  lisinopril (PRINIVIL,ZESTRIL) 40 MG tablet Take 40 mg by mouth in the morning. 04/27/18  Yes [provider]  Multiple Vitamin (MULTIVITAMIN WITH MINERALS) TABS tablet Take 1 tablet by mouth in the morning.   Yes [provider]  omeprazole (PRILOSEC) 20 MG capsule Take 1 capsule (20 mg total) by mouth daily. 06/12/21 04/13/22 Yes Marcello Fennel, PA-C  Probiotic TBEC Take 1 capsule by mouth in the morning.   Yes [provider]  promethazine-dextromethorphan (PROMETHAZINE-DM) 6.25-15 MG/5ML syrup Take 5 mLs by mouth 4 (four) times daily as needed. 11/08/21  Yes Volney American, PA-C  QUEtiapine (SEROQUEL) 100 MG tablet Take 100 mg by mouth daily with supper.   Yes [provider]  simvastatin (ZOCOR) 40 MG tablet Take 40 mg by mouth at bedtime.   Yes [provider]  traZODone (DESYREL) 150 MG tablet Take 150 mg by mouth at bedtime.   Yes [provider]     Allergies as of 04/13/2022 - Review Complete 04/13/2022  Allergen Reaction Noted   Corticosteroids Other (See Comments) 12/24/2020   Nitrofurantoin monohyd macro Nausea And Vomiting 11/03/2010   Sulfa antibiotics Rash 06/12/2015    Family History  Problem Relation Age of Onset   Hypertension Mother    Hyperlipidemia Mother    Heart disease Father    Heart disease Brother        passed age 62 from heart valve complication of some sort   Colon polyps Brother    Colon polyps Brother        deceased age 83 from "unknown"   Colon cancer Neg Hx     Social History   Socioeconomic History   Marital status: Divorced    Spouse name: Not on file   Number of children: Not on file   Years of education: Not  on file   Highest education level: Not on file  Occupational History   Not on file  Tobacco Use   Smoking status: Former    Packs/day: 0.50    Years: 10.00    Total pack years: 5.00    Types: Cigarettes    Start date: 05/10/1978    Quit date: 12/14/2017    Years since quitting: 4.3    Passive exposure: Past   Smokeless tobacco: Never  Vaping Use   Vaping Use: Never used  Substance and Sexual Activity   Alcohol use: No    Alcohol/week: 0.0 standard drinks of alcohol   Drug use: No   Sexual activity: Not Currently    Partners: Male    Birth control/protection: Surgical  Other Topics Concern   Not on file  Social History Narrative   Not on file   Social Determinants of Health   Financial Resource Strain: Not on file  Food Insecurity: Not on file  Transportation Needs: Not on file  Physical Activity: Not on file  Stress: Not on file  Social Connections: Not on file  Intimate Partner Violence: Not on file    Review of Systems: See HPI, otherwise negative ROS  Physical Exam: BP 96/68 (BP Location: Right Arm, Patient Position: Sitting, Cuff Size: Large)   Pulse 89   Temp 98.2 F (36.8 C) (Oral)   Ht '4\' 11"'$  (1.499 m)   Wt 159 lb (72.1 kg)   BMI 32.11 kg/m   General:   Alert,   well-nourished, pleasant and cooperative in NAD.  Centrally obese. Abdomen: Non-distended, normal bowel sounds.  Soft and nontender without appreciable mass or hepatosplenomegaly.   Impression/Plan: 61 year old lady with GERD on multiple PPIs recent chest pain and diarrhea in the setting of acute illness negative workup in [place].  The symptoms resolved. No significant upper GI tract symptoms at this time.  Does have alternating constipation about 50-50 of the time.  Is up-to-date on colonoscopy. He does have steatosis on cross-sectional imaging.  Low K PA on elastography.  Positive hepatitis C antibody but negative PCR normal LFTs.  History of alcohol use as outlined above.  She likely has benign fatty liver.  Recommendations:  To simplify your acid reflux treatment regimen I recommend the following:  Stop the Dexilant and omeprazole  Begin pantoprazole 40 mg p.o. 30 minutes before breakfast daily (dispense 30 with 11 refills)  Begin begin Benefiber 1 tablespoon spoon daily x 3 weeks; then increase to 2 tablespoons daily thereafter take the same time every day take every day where the feel you needed or not  For fatty liver, strive for an ideal body weight it would be nice if you could lose 10 pounds in the next 3 months  Aerobic exercise 3 times weekly.  Drinking coffee as you do daily actually has a beneficial effect on the liver.  Colonoscopy planned 2027.  Office visit here in 3 months       Notice: This dictation was prepared with Dragon dictation along with smaller phrase technology. Any transcriptional errors that result from this process are unintentional and may not be corrected upon review.

## 2022-04-13 NOTE — Progress Notes (Unsigned)
Primary Care Physician:  Wilmon Pali, FNP Primary Gastroenterologist:  Dr.   Pre-Procedure History & Physical: HPI:  Sabrina Beasley is a 61 y.o. female here for   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)    Bulging of cervical intervertebral disc    C4-5 &  C6-7   Constipation    DDD (degenerative disc disease), cervical    stenosis   Depression    Family history of premature CAD    GERD (gastroesophageal reflux disease)    Gout    History of endometriosis    History of kidney stones     right side w associated n/v   History of ovarian cyst    History of rheumatic fever    Hx of hepatitis C dx 04-16-2009 liver bx   mild portal/ periportal fibrosis-- lower serum level detection positive antibody virus RNA   Hyperlipidemia    OA (osteoarthritis)    Osteoporosis    PONV (postoperative nausea and vomiting)    pt states scopalomine patches help   Pre-diabetes    Right ureteral stone     Past Surgical History:  Procedure Laterality Date   CARDIOVASCULAR STRESS TEST  06/19/2015  dr Purvis Sheffield   normal nuclear study, low risk w/ overall LVSF normal and wall motion normal , stress ef 57%   CARPAL TUNNEL RELEASE Right x2  last one   CARPAL TUNNEL RELEASE Left 02/23/2013   Procedure: CARPAL TUNNEL RELEASE;  Surgeon: Vickki Hearing, MD;  Location: [facility];  Service: Orthopedics;  Laterality: Left;   CHOLECYSTECTOMY  1999   COLONOSCOPY  01/2012   Dr. Allena Katz: tortous colon, multiple colon polyps, 2 cauterized in ascending colon, one transverse colon adenomatous, sigmoid polyps (6) hyperplastic.    COLONOSCOPY WITH PROPOFOL N/A 12/25/2020   Procedure: COLONOSCOPY WITH PROPOFOL;  Surgeon: Corbin Ade, MD;  [facility]: [facility];  Service: Endoscopy;  Laterality: N/A;  11:15am   CYSTOSCOPY W/ RETROGRADES Right 05/07/2016   Procedure: CYSTOSCOPY WITH RETROGRADE PYELOGRAM;  Surgeon: Jerilee Field, MD;  Location:  [facility];  Service: Urology;  Laterality: Right;   DX LAPAROSCOPY/  D & C HYSTEROSCOPY  2001   ESOPHAGOGASTRODUODENOSCOPY  05/2012   Dr. Allena Katz: esophagitis, gastritis, no hpylori   LAPAROSCOPIC SALPINGO OOPHERECTOMY Right 08/24/2006   ORIF ANKLE FRACTURE Left    POLYPECTOMY  12/25/2020   Procedure: POLYPECTOMY;  Surgeon: Corbin Ade, MD;  [facility]: [facility];  Service: Endoscopy;;   RECTOCELE REPAIR N/A 01/13/2016   Procedure: POSTERIOR VAGINAL REPAIR (RECTOCELE);  Surgeon: Tilda Burrow, MD;  Location: [facility];  Service: Gynecology;  Laterality: N/A;   TOTAL ABDOMINAL HYSTERECTOMY Left 05/22/2002   w/  Left Salpingo Oopherectomy   TRANSTHORACIC ECHOCARDIOGRAM  06/19/2015   ef 60-65%/  trivial MR and PR   TUBAL LIGATION  1989    Prior to Admission medications   Medication Sig Start Date End Date Taking? Authorizing Provider  albuterol (VENTOLIN HFA) 108 (90 Base) MCG/ACT inhaler Inhale 1-2 puffs into the lungs every 6 (six) hours as needed for wheezing or shortness of breath. 11/08/21  Yes Particia Nearing, PA-C  butalbital-acetaminophen-caffeine (FIORICET, ESGIC) [phone] MG tablet Take 1 tablet by mouth at bedtime. 11/14/17  Yes [provider]  Cholecalciferol (VITAMIN D3) 50 MCG (2000 UT) capsule Take 2,000 Units by mouth daily. 07/14/20  Yes [provider]  DEXILANT 60  MG capsule Take 1 capsule by mouth once daily 07/28/18  Yes Gelene Mink, NP  dicyclomine (BENTYL) 20 MG tablet Take 1 tablet (20 mg total) by mouth 2 (two) times daily as needed for spasms. 06/12/21  Yes Carroll Sage, PA-C  DULoxetine (CYMBALTA) 30 MG capsule Take 30 mg by mouth in the morning. 30 mg + 60 mg=90 mg 06/30/20  Yes [provider]  DULoxetine (CYMBALTA) 60 MG capsule Take 60 mg by mouth in the morning. 60 mg + 30 mg=90 mg 06/08/17  Yes [provider]  fenofibrate (TRICOR) 145 MG tablet Take 145 mg by mouth at bedtime. 10/16/20  Yes [provider]  hydrochlorothiazide (MICROZIDE) 12.5 MG capsule Take 12.5 mg by mouth in the morning. 06/08/18  Yes [provider]  hydrOXYzine (VISTARIL) 25 MG capsule Take 25 mg by mouth at bedtime.   Yes [provider]  levocetirizine (XYZAL) 5 MG tablet Take 5 mg by mouth every evening.   Yes [provider]  lisinopril (PRINIVIL,ZESTRIL) 40 MG tablet Take 40 mg by mouth in the morning. 04/27/18  Yes [provider]  Multiple Vitamin (MULTIVITAMIN WITH MINERALS) TABS tablet Take 1 tablet by mouth in the morning.   Yes [provider]  omeprazole (PRILOSEC) 20 MG capsule Take 1 capsule (20 mg total) by mouth daily. 06/12/21 04/13/22 Yes Carroll Sage, PA-C  Probiotic TBEC Take 1 capsule by mouth in the morning.   Yes [provider]  promethazine-dextromethorphan (PROMETHAZINE-DM) 6.25-15 MG/5ML syrup Take 5 mLs by mouth 4 (four) times daily as needed. 11/08/21  Yes Particia Nearing, PA-C  QUEtiapine (SEROQUEL) 100 MG tablet Take 100 mg by mouth daily with supper.   Yes [provider]  simvastatin (ZOCOR) 40 MG tablet Take 40 mg by mouth at bedtime.   Yes [provider]  traZODone (DESYREL) 150 MG tablet Take 150 mg by mouth at bedtime.   Yes [provider]    Allergies as of 04/13/2022 - Review Complete 04/13/2022  Allergen Reaction Noted   Corticosteroids Other (See Comments) 12/24/2020   Nitrofurantoin monohyd macro Nausea And Vomiting 11/03/2010   Sulfa antibiotics Rash 06/12/2015    Family History  Problem Relation Age of Onset   Hypertension Mother    Hyperlipidemia Mother    Heart disease Father    Heart disease Brother        passed age 81 from heart valve complication of some sort   Colon polyps Brother    Colon polyps Brother        deceased age 90 from "unknown"   Colon cancer Neg Hx     Social History   Socioeconomic History   Marital status: Divorced    Spouse name: Not on  file   Number of children: Not on file   Years of education: Not on file   Highest education level: Not on file  Occupational History   Not on file  Tobacco Use   Smoking status: Former    Packs/day: 0.50    Years: 10.00    Total pack years: 5.00    Types: Cigarettes    Start date: 05/10/1978    Quit date: 12/14/2017    Years since quitting: 4.3    Passive exposure: Past   Smokeless tobacco: Never  Vaping Use   Vaping Use: Never used  Substance and Sexual Activity   Alcohol use: No    Alcohol/week: 0.0 standard drinks of alcohol   Drug  use: No   Sexual activity: Not Currently    Partners: Male    Birth control/protection: Surgical  Other Topics Concern   Not on file  Social History Narrative   Not on file   Social Determinants of Health   Financial Resource Strain: Not on file  Food Insecurity: Not on file  Transportation Needs: Not on file  Physical Activity: Not on file  Stress: Not on file  Social Connections: Not on file  Intimate Partner Violence: Not on file    Review of Systems: See HPI, otherwise negative ROS  Physical Exam: BP 96/68 (BP Location: Right Arm, Patient Position: Sitting, Cuff Size: Large)   Pulse 89   Temp 98.2 F (36.8 C) (Oral)   Ht 4\' 11"  (1.499 m)   Wt 159 lb (72.1 kg)   BMI 32.11 kg/m  General:   Alert,  Well-developed, well-nourished, pleasant and cooperative in NAD Skin:  Intact without significant lesions or rashes. Eyes:  Sclera clear, no icterus.   Conjunctiva pink. Ears:  Normal auditory acuity. Nose:  No deformity, discharge,  or lesions. Mouth:  No deformity or lesions. Neck:  Supple; no masses or thyromegaly. No significant cervical adenopathy. Lungs:  Clear throughout to auscultation.   No wheezes, crackles, or rhonchi. No acute distress. Heart:  Regular rate and rhythm; no murmurs, clicks, rubs,  or gallops. Abdomen: Non-distended, normal bowel sounds.  Soft and nontender without appreciable mass or hepatosplenomegaly.   Pulses:  Normal pulses noted. Extremities:  Without clubbing or edema.  Impression/Plan:  ***     Notice: This dictation was prepared with Dragon dictation along with smaller phrase technology. Any transcriptional errors that result from this process are unintentional and may not be corrected upon review.

## 2022-04-14 ENCOUNTER — Other Ambulatory Visit: Payer: Self-pay | Admitting: *Deleted

## 2022-04-14 MED ORDER — PANTOPRAZOLE SODIUM 40 MG PO TBEC: 40.0000 mg | DELAYED_RELEASE_TABLET | Freq: Every day | ORAL | 3 refills | Status: AC

## 2022-06-17 ENCOUNTER — Ambulatory Visit: Payer: 59 | Attending: Internal Medicine | Admitting: Internal Medicine

## 2022-06-17 ENCOUNTER — Encounter: Payer: Self-pay | Admitting: Internal Medicine

## 2022-06-17 VITALS — BP 108/74 | HR 83 | Ht 59.0 in | Wt 160.0 lb

## 2022-06-17 DIAGNOSIS — R079 Chest pain, unspecified: Secondary | ICD-10-CM | POA: Diagnosis not present

## 2022-06-17 DIAGNOSIS — I159 Secondary hypertension, unspecified: Secondary | ICD-10-CM

## 2022-06-17 DIAGNOSIS — R0683 Snoring: Secondary | ICD-10-CM | POA: Diagnosis not present

## 2022-06-17 NOTE — Progress Notes (Signed)
Cardiology Office Note  Date: 06/17/2022   ID: Sabrina Beasley, Sabrina Beasley February 15, 1961, MRN [id number]  PCP:  Coolidge Breeze, FNP  Cardiologist:  Chalmers Guest, MD Electrophysiologist:  None   Reason for Office Visit: Evaluation chest pain at the request of Theda Sers, FNP   History of Present Illness: Sabrina Beasley is a 62 y.o. female known to have HTN, HLD, brain aneurysm was referred to cardiology clinic for evaluation of chest pain.  Patient was initially referred to cardiology clinic in 2017 for evaluation chest pain when NM stress test showed no evidence of ischemia and echocardiogram showed normal LVEF, no valvular abnormalities. She was in perfect state of health until 3 months ago when she started to notice substernal chest pains migrating to underneath the left breast, lasting for few minutes, intermittent throughout the day, no relation with rest or exercise and resolves spontaneously. Patient stated this has been occurring almost every day for the last 3 months. Associated with SOB, palpitations. Otherwise denies any syncope, dizziness, lightheadedness. Denies smoking cigarettes. No family history of premature ASCVD but her father and brother passed away in their 66s to 60s due to unclear cause and thinks could be from heart disease.  Patient reported that she snores at nighttime but her insurance would not pay for home sleep study.  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)    Bulging of cervical intervertebral disc    C4-5 &  C6-7   Constipation    DDD (degenerative disc disease), cervical    stenosis   Depression    Family history of premature CAD    GERD (gastroesophageal reflux disease)    Gout    History of endometriosis    History of kidney stones     right side w associated n/v   History of ovarian cyst    History of rheumatic fever    Hx of hepatitis C dx 04-16-2009 liver bx   mild portal/ periportal  fibrosis-- lower serum level detection positive antibody virus RNA   Hyperlipidemia    OA (osteoarthritis)    Osteoporosis    PONV (postoperative nausea and vomiting)    pt states scopalomine patches help   Pre-diabetes    Right ureteral stone     Past Surgical History:  Procedure Laterality Date   CARDIOVASCULAR STRESS TEST  06/19/2015  dr Bronson Ing   normal nuclear study, low risk w/ overall LVSF normal and wall motion normal , stress ef 57%   CARPAL TUNNEL RELEASE Right x2  last one   CARPAL TUNNEL RELEASE Left 02/23/2013   Procedure: CARPAL TUNNEL RELEASE;  Surgeon: Carole Civil, MD;  Location: [facility];  Service: Orthopedics;  Laterality: Left;   CHOLECYSTECTOMY  1999   COLONOSCOPY  01/2012   Dr. Posey Pronto: tortous colon, multiple colon polyps, 2 cauterized in ascending colon, one transverse colon adenomatous, sigmoid polyps (6) hyperplastic.    COLONOSCOPY WITH PROPOFOL N/A 12/25/2020   Procedure: COLONOSCOPY WITH PROPOFOL;  Surgeon: Daneil Dolin, MD;  [facility];  Service: Endoscopy;  Laterality: N/A;  11:15am   CYSTOSCOPY W/ RETROGRADES Right 05/07/2016   Procedure: CYSTOSCOPY WITH RETROGRADE PYELOGRAM;  Surgeon: Festus Aloe, MD;  Location: [facility];  Service: Urology;  Laterality: Right;   DX LAPAROSCOPY/  D & C HYSTEROSCOPY  2001   ESOPHAGOGASTRODUODENOSCOPY  05/2012   Dr. Posey Pronto: esophagitis, gastritis, no hpylori   LAPAROSCOPIC  SALPINGO OOPHERECTOMY Right 08/24/2006   ORIF ANKLE FRACTURE Left    POLYPECTOMY  12/25/2020   Procedure: POLYPECTOMY;  Surgeon: Daneil Dolin, MD;  [facility];  Service: Endoscopy;;   RECTOCELE REPAIR N/A 01/13/2016   Procedure: POSTERIOR VAGINAL REPAIR (RECTOCELE);  Surgeon: Jonnie Kind, MD;  Location: [facility];  Service: Gynecology;  Laterality: N/A;   TOTAL ABDOMINAL HYSTERECTOMY Left 05/22/2002   w/  Left Salpingo Oopherectomy   TRANSTHORACIC ECHOCARDIOGRAM  06/19/2015   ef 60-65%/   trivial MR and PR   TUBAL LIGATION  1989    Current Outpatient Medications  Medication Sig Dispense Refill   albuterol (VENTOLIN HFA) 108 (90 Base) MCG/ACT inhaler Inhale 1-2 puffs into the lungs every 6 (six) hours as needed for wheezing or shortness of breath. 18 g 0   butalbital-acetaminophen-caffeine (FIORICET, ESGIC) 50-325-40 MG tablet Take 1 tablet by mouth at bedtime.     Cholecalciferol (VITAMIN D3) 50 MCG (2000 UT) capsule Take 2,000 Units by mouth daily.     DEXILANT 60 MG capsule Take 1 capsule by mouth once daily 90 capsule 3   dicyclomine (BENTYL) 20 MG tablet Take 1 tablet (20 mg total) by mouth 2 (two) times daily as needed for spasms. 20 tablet 0   DULoxetine (CYMBALTA) 30 MG capsule Take 30 mg by mouth in the morning. 30 mg + 60 mg=90 mg     DULoxetine (CYMBALTA) 60 MG capsule Take 60 mg by mouth in the morning. 60 mg + 30 mg=90 mg  1   fenofibrate (TRICOR) 145 MG tablet Take 145 mg by mouth at bedtime.     hydrochlorothiazide (MICROZIDE) 12.5 MG capsule Take 12.5 mg by mouth in the morning.     hydrOXYzine (VISTARIL) 25 MG capsule Take 25 mg by mouth at bedtime.     levocetirizine (XYZAL) 5 MG tablet Take 5 mg by mouth every evening.     lisinopril (PRINIVIL,ZESTRIL) 40 MG tablet Take 40 mg by mouth in the morning.     Multiple Vitamin (MULTIVITAMIN WITH MINERALS) TABS tablet Take 1 tablet by mouth in the morning.     pantoprazole (PROTONIX) 40 MG tablet Take 1 tablet (40 mg total) by mouth daily. Take one tablet daily 30 minutes before breaksfast 90 tablet 3   Probiotic TBEC Take 1 capsule by mouth in the morning.     promethazine-dextromethorphan (PROMETHAZINE-DM) 6.25-15 MG/5ML syrup Take 5 mLs by mouth 4 (four) times daily as needed. 100 mL 0   QUEtiapine (SEROQUEL) 100 MG tablet Take 100 mg by mouth daily with supper.     simvastatin (ZOCOR) 40 MG tablet Take 40 mg by mouth at bedtime.     traZODone (DESYREL) 150 MG tablet Take 150 mg by mouth at bedtime.     No  current facility-administered medications for this visit.   Allergies:  Corticosteroids, Nitrofurantoin monohyd macro, and Sulfa antibiotics   Social History: The patient  reports that she quit smoking about 4 years ago. Her smoking use included cigarettes. She started smoking about 44 years ago. She has a 5.00 pack-year smoking history. She has been exposed to tobacco smoke. She has never used smokeless tobacco. She reports that she does not drink alcohol and does not use drugs.   Family History: The patient's family history includes Colon polyps in her brother and brother; Heart disease in her brother and father; Hyperlipidemia in her mother; Hypertension in her mother.   ROS:  Please see the history of present illness.  Otherwise, complete review of systems is positive for none.  All other systems are reviewed and negative.   Physical Exam: VS:  BP 108/74   Pulse 83   Ht '4\' 11"'$  (1.499 m)   Wt 160 lb (72.6 kg)   SpO2 98%   BMI 32.32 kg/m , BMI Body mass index is 32.32 kg/m.  Wt Readings from Last 3 Encounters:  06/17/22 160 lb (72.6 kg)  04/13/22 159 lb (72.1 kg)  12/22/20 160 lb (72.6 kg)    General: Patient appears comfortable at rest. HEENT: Conjunctiva and lids normal, oropharynx clear with moist mucosa. Neck: Supple, no elevated JVP or carotid bruits, no thyromegaly. Lungs: Clear to auscultation, nonlabored breathing at rest. Cardiac: Regular rate and rhythm, no S3 or significant systolic murmur, no pericardial rub. Abdomen: Soft, nontender, no hepatomegaly, bowel sounds present, no guarding or rebound. Extremities: No pitting edema, distal pulses 2+. Skin: Warm and dry. Musculoskeletal: No kyphosis. Neuropsychiatric: Alert and oriented x3, affect grossly appropriate.  ECG:  An ECG dated 06/17/2022 was personally reviewed today and demonstrated:  Normal sinus rhythm and no ST-T changes  Recent Labwork: No results found for requested labs within last 365 days.  No results  found for: "CHOL", "TRIG", "HDL", "CHOLHDL", "VLDL", "LDLCALC", "LDLDIRECT"  Other Studies Reviewed Today: NM stress test in 2017 There was no ST segment deviation noted during stress. This is a low risk study. Nuclear stress EF: 57%. Poor exercise tolerance, limiting the ability of the study to detect ischemia. Duke treadmill score indicates a moderate risk of ischemic heart disease.  Echo in 2017 LVEF 60-65% Diastology normal No valvular abnormalities  Assessment and Plan: Patient is a 62 year old F known to have HTN, HLD, brain aneurysm was referred to cardiology clinic for evaluation of chest pain.  # Atypical chest pain -Patient has substernal chest pains migrating to underneath the left breast x 3 months, lasting for few minutes, intermittent throughout the day, no relation with rest or exercise and resolves spontaneously. Obtain Lexiscan. If nuclear stress test is normal, she will benefit from CT coronary calcium scoring.  # HTN, controlled -Continue HCTZ 12.5 mg once daily -Continue lisinopril 40 mg once daily  # Snoring -Patient said her insurance would not cover for home sleep apnea testing and will follow-up with her PCP for lab sleep study.  # HLD -Continue simvastatin 40 mg nightly.  I have spent a total of 45 minutes with patient reviewing chart, EKGs, labs and examining patient as well as establishing an assessment and plan that was discussed with the patient.  > 50% of time was spent in direct patient care.      Medication Adjustments/Labs and Tests Ordered: Current medicines are reviewed at length with the patient today.  Concerns regarding medicines are outlined above.   Tests Ordered: Orders Placed This Encounter  Procedures   EKG 12-Lead   Orders Placed This Encounter  Procedures   NM Myocar Multi W/Spect W/Wall Motion / EF   EKG 12-Lead     Medication Changes: No orders of the defined types were placed in this encounter.   Disposition:  Follow  up  1 year  Signed, Adabelle Griffiths Fidel Levy, MD, 06/17/2022 11:09 AM    [facility] [address] [place], [place] [address]

## 2022-06-17 NOTE — Patient Instructions (Signed)
Medication Instructions:  Your physician recommends that you continue on your current medications as directed. Please refer to the Current Medication list given to you today.   Labwork: None  Testing/Procedures: Your physician has requested that you have a lexiscan myoview. For further information please visit [email or web address]. Please follow instruction sheet, as given.   Follow-Up: Follow up with Dr. Dellia Cloud in 6 months.   Any Other Special Instructions Will Be Listed Below (If Applicable).     If you need a refill on your cardiac medications before your next appointment, please call your pharmacy.

## 2022-06-25 ENCOUNTER — Ambulatory Visit (HOSPITAL_COMMUNITY)
Admission: RE | Admit: 2022-06-25 | Discharge: 2022-06-25 | Disposition: A | Discharge status: Home / Self Care | Payer: 59 | Source: Ambulatory Visit | Attending: Internal Medicine | Admitting: Internal Medicine

## 2022-06-25 ENCOUNTER — Encounter (HOSPITAL_COMMUNITY)
Admission: RE | Admit: 2022-06-25 | Discharge: 2022-06-25 | Disposition: A | Discharge status: Home / Self Care | Payer: 59 | Source: Ambulatory Visit | Attending: Internal Medicine | Admitting: Internal Medicine

## 2022-06-25 ENCOUNTER — Telehealth: Payer: Self-pay | Admitting: Internal Medicine

## 2022-06-25 DIAGNOSIS — R079 Chest pain, unspecified: Secondary | ICD-10-CM | POA: Diagnosis not present

## 2022-06-25 DIAGNOSIS — R9439 Abnormal result of other cardiovascular function study: Secondary | ICD-10-CM

## 2022-06-25 LAB — NM MYOCAR MULTI W/SPECT W/WALL MOTION / EF
LV dias vol: 30 mL (ref 46–106)
LV sys vol: 16 mL
Nuc Stress EF: 48 %
Peak HR: 127 {beats}/min
RATE: 0.4
Rest HR: 87 {beats}/min
Rest Nuclear Isotope Dose: 10.5 mCi
SDS: 0
SRS: 1
SSS: 1
ST Depression (mm): 0 mm
Stress Nuclear Isotope Dose: 30.8 mCi
TID: 0.65

## 2022-06-25 MED ORDER — SODIUM CHLORIDE FLUSH 0.9 % IV SOLN
INTRAVENOUS | Status: AC
  Administered 2022-06-25: 10 mL via INTRAVENOUS
  Filled 2022-06-25: qty 10

## 2022-06-25 MED ORDER — REGADENOSON 0.4 MG/5ML IV SOLN
INTRAVENOUS | Status: AC
  Administered 2022-06-25: 0.4 mg via INTRAVENOUS
  Filled 2022-06-25: qty 5

## 2022-06-25 MED ORDER — TECHNETIUM TC 99M TETROFOSMIN IV KIT
10.5000 | PACK | Freq: Once | INTRAVENOUS | Status: AC | PRN
  Administered 2022-06-25: 10.5 via INTRAVENOUS

## 2022-06-25 MED ORDER — TECHNETIUM TC 99M TETROFOSMIN IV KIT
30.8000 | PACK | Freq: Once | INTRAVENOUS | Status: AC | PRN
  Administered 2022-06-25: 30.8 via INTRAVENOUS

## 2022-06-25 NOTE — Telephone Encounter (Signed)
Pt returning call regarding test results. Please advise ?

## 2022-06-25 NOTE — Telephone Encounter (Signed)
Sabrina Beasley, Sabrina Raven, MD  Christella Scheuermann C, CMA Normal stress test. Nuclear EF is low, 48%. Obtain 2D Echo.     Patient notified of lexiscan results, will have echo on Monday 06/28/22 at 0830 hrs at [facility]

## 2022-06-28 ENCOUNTER — Ambulatory Visit (HOSPITAL_COMMUNITY)
Admission: RE | Admit: 2022-06-28 | Discharge: 2022-06-28 | Disposition: A | Discharge status: Home / Self Care | Payer: 59 | Source: Ambulatory Visit | Attending: Internal Medicine | Admitting: Internal Medicine

## 2022-06-28 DIAGNOSIS — E119 Type 2 diabetes mellitus without complications: Secondary | ICD-10-CM | POA: Insufficient documentation

## 2022-06-28 DIAGNOSIS — R079 Chest pain, unspecified: Secondary | ICD-10-CM

## 2022-06-28 DIAGNOSIS — R9439 Abnormal result of other cardiovascular function study: Secondary | ICD-10-CM | POA: Diagnosis not present

## 2022-06-28 DIAGNOSIS — R9431 Abnormal electrocardiogram [ECG] [EKG]: Secondary | ICD-10-CM | POA: Diagnosis not present

## 2022-06-28 DIAGNOSIS — R0602 Shortness of breath: Secondary | ICD-10-CM | POA: Diagnosis not present

## 2022-06-28 DIAGNOSIS — I119 Hypertensive heart disease without heart failure: Secondary | ICD-10-CM | POA: Insufficient documentation

## 2022-06-28 DIAGNOSIS — E785 Hyperlipidemia, unspecified: Secondary | ICD-10-CM | POA: Insufficient documentation

## 2022-06-28 LAB — ECHOCARDIOGRAM COMPLETE
Area-P 1/2: 5.07 cm2
Calc EF: 67.8 %
S' Lateral: 2.1 cm
Single Plane A2C EF: 67.6 %
Single Plane A4C EF: 67.2 %

## 2022-06-28 NOTE — Progress Notes (Signed)
  Echocardiogram 2D Echocardiogram has been performed.  Bobbye Charleston 06/28/2022, 9:31 AM

## 2022-07-13 ENCOUNTER — Ambulatory Visit: Payer: 59 | Admitting: Internal Medicine

## 2022-12-03 ENCOUNTER — Ambulatory Visit
Admission: EM | Admit: 2022-12-03 | Discharge: 2022-12-03 | Disposition: A | Discharge status: Home / Self Care | Payer: 59

## 2022-12-03 ENCOUNTER — Encounter: Payer: Self-pay | Admitting: Emergency Medicine

## 2022-12-03 DIAGNOSIS — R197 Diarrhea, unspecified: Secondary | ICD-10-CM | POA: Diagnosis not present

## 2022-12-03 DIAGNOSIS — I959 Hypotension, unspecified: Secondary | ICD-10-CM

## 2022-12-03 HISTORY — DX: Essential (primary) hypertension: I10

## 2022-12-03 MED ORDER — DIPHENOXYLATE-ATROPINE 2.5-0.025 MG PO TABS: 1.0000 | ORAL_TABLET | Freq: Four times a day (QID) | ORAL | 0 refills | Status: DC | PRN

## 2022-12-03 NOTE — ED Triage Notes (Signed)
Hx of constipation and took a dulcolax on Sunday.  Took miralax on Monday night.  Woke up Tuesday with diarrhea and has had diarrhea ever since.  Has been taking over the counter diarrhea without relief.  Denies dizziness or nausea and vomiting.

## 2022-12-03 NOTE — Discharge Instructions (Signed)
We will send out labs and stool studies to further evaluate your symptoms.  I have also sent any medication to hopefully help slow down the diarrhea as the Imodium is not helping.  Make sure to drink plenty of plain water and electrolyte solutions.  Eat bland foods and you may also try probiotics, fiber supplements.  I also recommend holding your hydrochlorothiazide until your blood pressures and symptoms improve as they are significantly low today.  Follow-up with your primary care provider early next week for a recheck, go to the emergency department if significantly worsening anytime.

## 2022-12-03 NOTE — ED Provider Notes (Signed)
[facility]    CSN: [id number] Arrival date & time: 12/03/22  1016      History   Chief Complaint No chief complaint on file.   HPI Sabrina Beasley is a 62 y.o. female.   Patient presenting today with 5-day history of copious diarrhea the patient describes as watery.  She states she was constipated over the weekend, took a Dulcolax on Sunday and a dose of MiraLAX on Monday, woke up with the diarrhea on Tuesday.  Denies blood or mucus in stools, nausea, vomiting, fever, chills, body aches, dizziness, new foods or medications, sick contacts, recent travels.  Trying Imodium with no benefit.  No known history of chronic GI issues apart from GERD.    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)    Bulging of cervical intervertebral disc    C4-5 &  C6-7   Constipation    DDD (degenerative disc disease), cervical    stenosis   Depression    Family history of premature CAD    GERD (gastroesophageal reflux disease)    Gout    History of endometriosis    History of kidney stones     right side w associated n/v   History of ovarian cyst    History of rheumatic fever    Hx of hepatitis C dx 04-16-2009 liver bx   mild portal/ periportal fibrosis-- lower serum level detection positive antibody virus RNA   Hyperlipidemia    Hypertension    OA (osteoarthritis)    Osteoporosis    PONV (postoperative nausea and vomiting)    pt states scopalomine patches help   Pre-diabetes    Right ureteral stone     Patient Active Problem List   Diagnosis Date Noted   Snoring 06/17/2022   Anxiety 05/01/2019   Hypertension 05/01/2019   GERD (gastroesophageal reflux disease) 06/19/2018   Constipation 06/19/2018   Cerebral aneurysm 02/07/2018   SAH (subarachnoid hemorrhage) (HCC) 08/22/2017   Thrombocytosis 02/23/2017   Hx of hepatitis C 07/01/2016   Rectocele 12/23/2015   Diabetes mellitus without complication (HCC) 09/03/2015    Chest pain 01/10/2014   Hyperlipidemia 01/10/2014   S/P carpal tunnel release 02/26/2013   CTS (carpal tunnel syndrome) 02/14/2013   Arm numbness 11/06/2010    Past Surgical History:  Procedure Laterality Date   CARDIOVASCULAR STRESS TEST  06/19/2015  dr Purvis Sheffield   normal nuclear study, low risk w/ overall LVSF normal and wall motion normal , stress ef 57%   CARPAL TUNNEL RELEASE Right x2  last one   CARPAL TUNNEL RELEASE Left 02/23/2013   Procedure: CARPAL TUNNEL RELEASE;  Surgeon: Vickki Hearing, MD;  Location: [facility];  Service: Orthopedics;  Laterality: Left;   CHOLECYSTECTOMY  1999   COLONOSCOPY  01/2012   Dr. Allena Katz: tortous colon, multiple colon polyps, 2 cauterized in ascending colon, one transverse colon adenomatous, sigmoid polyps (6) hyperplastic.    COLONOSCOPY WITH PROPOFOL N/A 12/25/2020   Procedure: COLONOSCOPY WITH PROPOFOL;  Surgeon: Corbin Ade, MD;  Location: [facility];  Service: Endoscopy;  Laterality: N/A;  11:15am   CYSTOSCOPY W/ RETROGRADES Right 05/07/2016   Procedure: CYSTOSCOPY WITH RETROGRADE PYELOGRAM;  Surgeon: Jerilee Field, MD;  Location: [facility];  Service: Urology;  Laterality: Right;   DX LAPAROSCOPY/  D & C HYSTEROSCOPY  2001   ESOPHAGOGASTRODUODENOSCOPY  05/2012   Dr. Allena Katz: esophagitis, gastritis, no hpylori  LAPAROSCOPIC SALPINGO OOPHERECTOMY Right 08/24/2006   ORIF ANKLE FRACTURE Left    POLYPECTOMY  12/25/2020   Procedure: POLYPECTOMY;  Surgeon: Corbin Ade, MD;  Location: [facility];  Service: Endoscopy;;   RECTOCELE REPAIR N/A 01/13/2016   Procedure: POSTERIOR VAGINAL REPAIR (RECTOCELE);  Surgeon: Tilda Burrow, MD;  Location: [facility];  Service: Gynecology;  Laterality: N/A;   TOTAL ABDOMINAL HYSTERECTOMY Left 05/22/2002   w/  Left Salpingo Oopherectomy   TRANSTHORACIC ECHOCARDIOGRAM  06/19/2015   ef 60-65%/  trivial MR and PR   TUBAL LIGATION  1989    OB History     Gravida  3   Para  3    Term  3   Preterm      AB      Living         SAB      IAB      Ectopic      Multiple      Live Births               Home Medications    Prior to Admission medications   Medication Sig Start Date End Date Taking? Authorizing Provider  diphenoxylate-atropine (LOMOTIL) 2.5-0.025 MG tablet Take 1 tablet by mouth 4 (four) times daily as needed for diarrhea or loose stools. 12/03/22  Yes Particia Nearing, PA-C  Semaglutide,0.25 or 0.5MG /DOS, (OZEMPIC, 0.25 OR 0.5 MG/DOSE,) 2 MG/1.5ML SOPN Inject into the skin.   Yes [provider]  albuterol (VENTOLIN HFA) 108 (90 Base) MCG/ACT inhaler Inhale 1-2 puffs into the lungs every 6 (six) hours as needed for wheezing or shortness of breath. 11/08/21   Particia Nearing, PA-C  butalbital-acetaminophen-caffeine (FIORICET, ESGIC) [phone] MG tablet Take 1 tablet by mouth at bedtime. 11/14/17   [provider]  Cholecalciferol (VITAMIN D3) 50 MCG (2000 UT) capsule Take 2,000 Units by mouth daily. 07/14/20   [provider]  DEXILANT 60 MG capsule Take 1 capsule by mouth once daily 07/28/18   Gelene Mink, NP  dicyclomine (BENTYL) 20 MG tablet Take 1 tablet (20 mg total) by mouth 2 (two) times daily as needed for spasms. 06/12/21   Carroll Sage, PA-C  DULoxetine (CYMBALTA) 30 MG capsule Take 30 mg by mouth in the morning. 30 mg + 60 mg=90 mg 06/30/20   [provider]  DULoxetine (CYMBALTA) 60 MG capsule Take 60 mg by mouth in the morning. 60 mg + 30 mg=90 mg 06/08/17   [provider]  fenofibrate (TRICOR) 145 MG tablet Take 145 mg by mouth at bedtime. 10/16/20   [provider]  hydrochlorothiazide (MICROZIDE) 12.5 MG capsule Take 12.5 mg by mouth in the morning. 06/08/18   [provider]  hydrOXYzine (VISTARIL) 25 MG capsule Take 25 mg by mouth at bedtime.    [provider]  levocetirizine (XYZAL) 5 MG tablet Take 5 mg by mouth every evening.    [provider]  lisinopril (PRINIVIL,ZESTRIL) 40 MG tablet Take 40 mg by mouth in the morning. 04/27/18   [provider]  Multiple Vitamin (MULTIVITAMIN WITH MINERALS) TABS tablet Take 1 tablet by mouth in the morning.    [provider]  pantoprazole (PROTONIX) 40 MG tablet Take 1 tablet (40 mg total) by mouth daily. Take one tablet daily 30 minutes before breaksfast 04/14/22   Rourk, Gerrit Friends, MD  Probiotic TBEC Take 1 capsule by mouth in the morning.    [provider]  QUEtiapine (  SEROQUEL) 100 MG tablet Take 100 mg by mouth daily with supper.    [provider]  simvastatin (ZOCOR) 40 MG tablet Take 40 mg by mouth at bedtime.    [provider]  traZODone (DESYREL) 150 MG tablet Take 150 mg by mouth at bedtime.    [provider]    Family History Family History  Problem Relation Age of Onset   Hypertension Mother    Hyperlipidemia Mother    Heart disease Father    Heart disease Brother        passed age 81 from heart valve complication of some sort   Colon polyps Brother    Colon polyps Brother        deceased age 60 from "unknown"   Colon cancer Neg Hx     Social History Social History   Tobacco Use   Smoking status: Former    Current packs/day: 0.00    Average packs/day: 0.5 packs/day for 39.6 years (19.8 ttl pk-yrs)    Types: Cigarettes    Start date: 05/10/1978    Quit date: 12/14/2017    Years since quitting: 4.9    Passive exposure: Past   Smokeless tobacco: Never  Vaping Use   Vaping status: Never Used  Substance Use Topics   Alcohol use: No    Alcohol/week: 0.0 standard drinks of alcohol   Drug use: No     Allergies   Corticosteroids, Nitrofurantoin monohyd macro, and Sulfa antibiotics   Review of Systems Review of Systems Per HPI  Physical Exam Triage Vital Signs ED Triage Vitals  Encounter Vitals Group     BP 12/03/22 1056 (!) 79/57     Systolic BP Percentile --      Diastolic BP Percentile  --      Pulse Rate 12/03/22 1053 73     Resp 12/03/22 1053 18     Temp 12/03/22 1053 98.1 F (36.7 C)     Temp Source 12/03/22 1053 Oral     SpO2 12/03/22 1053 97 %     Weight --      Height --      Head Circumference --      Peak Flow --      Pain Score 12/03/22 1058 0     Pain Loc --      Pain Education --      Exclude from Growth Chart --    Orthostatic VS for the past 24 hrs:  BP- Lying Pulse- Lying BP- Sitting Pulse- Sitting BP- Standing at 0 minutes Pulse- Standing at 0 minutes  12/03/22 1104 (!) 86/55 77 (!) 84/56 78 (!) 88/60 85    Updated Vital Signs BP (!) 82/58 (BP Location: Right Arm)   Pulse 73   Temp 98.1 F (36.7 C) (Oral)   Resp 18   SpO2 97%   Visual Acuity Right Eye Distance:   Left Eye Distance:   Bilateral Distance:    Right Eye Near:   Left Eye Near:    Bilateral Near:     Physical Exam Vitals and nursing note reviewed.  Constitutional:      Appearance: Normal appearance. She is not ill-appearing.  HENT:     Head: Atraumatic.     Mouth/Throat:     Mouth: Mucous membranes are moist.  Eyes:     Extraocular Movements: Extraocular movements intact.     Conjunctiva/sclera: Conjunctivae normal.  Cardiovascular:     Rate and Rhythm: Normal rate and regular rhythm.  Heart sounds: Normal heart sounds.  Pulmonary:     Effort: Pulmonary effort is normal.     Breath sounds: Normal breath sounds.  Abdominal:     General: Bowel sounds are normal. There is no distension.     Palpations: Abdomen is soft.     Tenderness: There is no abdominal tenderness. There is no right CVA tenderness, left CVA tenderness or guarding.  Musculoskeletal:        General: Normal range of motion.     Cervical back: Normal range of motion and neck supple.  Skin:    General: Skin is warm and dry.  Neurological:     Mental Status: She is alert and oriented to person, place, and time.  Psychiatric:        Mood and Affect: Mood normal.        Thought Content: Thought  content normal.        Judgment: Judgment normal.      UC Treatments / Results  Labs (all labs ordered are listed, but only abnormal results are displayed) Labs Reviewed  GASTROINTESTINAL PANEL BY PCR, STOOL (REPLACES STOOL CULTURE)  C DIFFICILE QUICK SCREEN W PCR REFLEX    COMPREHENSIVE METABOLIC PANEL    EKG   Radiology No results found.  Procedures Procedures (including critical care time)  Medications Ordered in UC Medications - No data to display  Initial Impression / Assessment and Plan / UC Course  I have reviewed the triage vital signs and the nursing notes.  Pertinent labs & imaging results that were available during my care of the patient were reviewed by me and considered in my medical decision making (see chart for details).     Hypotensive in triage, orthostatics negative and she is not dizzy.  Did discuss in light of her hypotension and persistent diarrhea that she should hold her hydrochlorothiazide at this time until her symptoms resolved and blood pressures regulate.  Follow-up with primary care provider next week regarding this.  Will also check CMP, stool studies for further evaluation and safety check.  Discussed fluids, including electrolyte solutions, bland foods and ED for worsening symptoms.  No red flag findings today. Final Clinical Impressions(s) / UC Diagnoses   Final diagnoses:  Diarrhea, unspecified type  Hypotension, unspecified hypotension type     Discharge Instructions      We will send out labs and stool studies to further evaluate your symptoms.  I have also sent any medication to hopefully help slow down the diarrhea as the Imodium is not helping.  Make sure to drink plenty of plain water and electrolyte solutions.  Eat bland foods and you may also try probiotics, fiber supplements.  I also recommend holding your hydrochlorothiazide until your blood pressures and symptoms improve as they are significantly low today.  Follow-up with  your primary care provider early next week for a recheck, go to the emergency department if significantly worsening anytime.    ED Prescriptions     Medication Sig Dispense Auth. Provider   diphenoxylate-atropine (LOMOTIL) 2.5-0.025 MG tablet Take 1 tablet by mouth 4 (four) times daily as needed for diarrhea or loose stools. 10 tablet Particia Nearing, [place]      I have reviewed the PDMP during this encounter.   Particia Nearing, [place] 12/03/22 1214

## 2022-12-05 ENCOUNTER — Encounter (HOSPITAL_COMMUNITY): Payer: Self-pay

## 2022-12-05 ENCOUNTER — Observation Stay (HOSPITAL_COMMUNITY)
Admission: EM | Admit: 2022-12-05 | Payer: 59 | Source: Home / Self Care | Attending: Emergency Medicine | Admitting: Emergency Medicine

## 2022-12-05 ENCOUNTER — Other Ambulatory Visit: Payer: Self-pay

## 2022-12-05 DIAGNOSIS — Z79899 Other long term (current) drug therapy: Secondary | ICD-10-CM | POA: Insufficient documentation

## 2022-12-05 DIAGNOSIS — Z7985 Long-term (current) use of injectable non-insulin antidiabetic drugs: Secondary | ICD-10-CM | POA: Insufficient documentation

## 2022-12-05 DIAGNOSIS — K219 Gastro-esophageal reflux disease without esophagitis: Secondary | ICD-10-CM | POA: Diagnosis present

## 2022-12-05 DIAGNOSIS — R197 Diarrhea, unspecified: Secondary | ICD-10-CM | POA: Diagnosis present

## 2022-12-05 DIAGNOSIS — Z6830 Body mass index (BMI) 30.0-30.9, adult: Secondary | ICD-10-CM | POA: Insufficient documentation

## 2022-12-05 DIAGNOSIS — N179 Acute kidney failure, unspecified: Secondary | ICD-10-CM | POA: Diagnosis not present

## 2022-12-05 DIAGNOSIS — I10 Essential (primary) hypertension: Secondary | ICD-10-CM | POA: Diagnosis not present

## 2022-12-05 DIAGNOSIS — D649 Anemia, unspecified: Secondary | ICD-10-CM | POA: Insufficient documentation

## 2022-12-05 DIAGNOSIS — R7303 Prediabetes: Secondary | ICD-10-CM | POA: Diagnosis not present

## 2022-12-05 DIAGNOSIS — Z87891 Personal history of nicotine dependence: Secondary | ICD-10-CM | POA: Insufficient documentation

## 2022-12-05 DIAGNOSIS — R112 Nausea with vomiting, unspecified: Secondary | ICD-10-CM | POA: Diagnosis not present

## 2022-12-05 DIAGNOSIS — E669 Obesity, unspecified: Secondary | ICD-10-CM | POA: Diagnosis not present

## 2022-12-05 DIAGNOSIS — K2100 Gastro-esophageal reflux disease with esophagitis, without bleeding: Secondary | ICD-10-CM | POA: Insufficient documentation

## 2022-12-05 DIAGNOSIS — E785 Hyperlipidemia, unspecified: Secondary | ICD-10-CM | POA: Diagnosis not present

## 2022-12-05 DIAGNOSIS — F419 Anxiety disorder, unspecified: Secondary | ICD-10-CM | POA: Diagnosis present

## 2022-12-05 LAB — COMPREHENSIVE METABOLIC PANEL
ALT: 35 U/L (ref 0–44)
AST: 36 U/L (ref 15–41)
Albumin: 4.3 g/dL (ref 3.5–5.0)
Alkaline Phosphatase: 37 U/L — ABNORMAL LOW (ref 38–126)
Anion gap: 8 (ref 5–15)
BUN: 50 mg/dL — ABNORMAL HIGH (ref 6–20)
CO2: 25 mmol/L (ref 22–32)
Calcium: 9.5 mg/dL (ref 8.9–10.3)
Chloride: 99 mmol/L (ref 98–111)
Creatinine, Ser: 2.57 mg/dL — ABNORMAL HIGH (ref 0.44–1.00)
GFR, Estimated: 21 mL/min — ABNORMAL LOW (ref >60)
Glucose, Bld: 132 mg/dL — ABNORMAL HIGH (ref 70–99)
Potassium: 3.7 mmol/L (ref 3.5–5.1)
Sodium: 132 mmol/L — ABNORMAL LOW (ref 135–145)
Total Bilirubin: 1 mg/dL (ref 0.3–1.2)
Total Protein: 7.5 g/dL (ref 6.5–8.1)

## 2022-12-05 LAB — CBC
HCT: 35.3 % — ABNORMAL LOW (ref 36.0–46.0)
Hemoglobin: 12.2 g/dL (ref 12.0–15.0)
MCH: 29.5 pg (ref 26.0–34.0)
MCHC: 34.6 g/dL (ref 30.0–36.0)
MCV: 85.5 fL (ref 80.0–100.0)
Platelets: 474 10*3/uL — ABNORMAL HIGH (ref 150–400)
RBC: 4.13 MIL/uL (ref 3.87–5.11)
RDW: 12.7 % (ref 11.5–15.5)
WBC: 12.9 10*3/uL — ABNORMAL HIGH (ref 4.0–10.5)
nRBC: 0 % (ref 0.0–0.2)

## 2022-12-05 LAB — LIPASE, BLOOD: Lipase: 49 U/L (ref 11–51)

## 2022-12-05 LAB — C DIFFICILE QUICK SCREEN W PCR REFLEX
C Diff antigen: NEGATIVE
C Diff interpretation: NOT DETECTED
C Diff toxin: NEGATIVE

## 2022-12-05 MED ORDER — SODIUM CHLORIDE 0.9 % IV BOLUS
1000.0000 mL | Freq: Once | INTRAVENOUS | Status: AC
  Administered 2022-12-05: 1000 mL via INTRAVENOUS

## 2022-12-05 MED ORDER — SODIUM CHLORIDE 0.9 % IV SOLN: INTRAVENOUS | Status: DC

## 2022-12-05 MED ORDER — HYDROXYZINE PAMOATE 25 MG PO CAPS: 25.0000 mg | ORAL_CAPSULE | Freq: Every evening | ORAL | Status: DC | PRN

## 2022-12-05 MED ORDER — ONDANSETRON HCL 4 MG/2ML IJ SOLN
4.0000 mg | Freq: Once | INTRAMUSCULAR | Status: AC
  Administered 2022-12-05: 4 mg via INTRAVENOUS
  Filled 2022-12-05: qty 2

## 2022-12-05 MED ORDER — ONDANSETRON HCL 4 MG/2ML IJ SOLN: 4.0000 mg | Freq: Four times a day (QID) | INTRAMUSCULAR | Status: DC | PRN

## 2022-12-05 MED ORDER — PANTOPRAZOLE SODIUM 40 MG PO TBEC
40.0000 mg | DELAYED_RELEASE_TABLET | Freq: Every day | ORAL | Status: DC
  Administered 2022-12-05 – 2022-12-06 (×2): 40 mg via ORAL
  Filled 2022-12-05 (×2): qty 1

## 2022-12-05 MED ORDER — ACETAMINOPHEN 325 MG PO TABS: 650.0000 mg | ORAL_TABLET | Freq: Four times a day (QID) | ORAL | Status: DC | PRN

## 2022-12-05 MED ORDER — HEPARIN SODIUM (PORCINE) 5000 UNIT/ML IJ SOLN
5000.0000 [IU] | Freq: Three times a day (TID) | INTRAMUSCULAR | Status: DC
  Administered 2022-12-05 – 2022-12-06 (×3): 5000 [IU] via SUBCUTANEOUS
  Filled 2022-12-05 (×3): qty 1

## 2022-12-05 MED ORDER — LORATADINE 10 MG PO TABS
10.0000 mg | ORAL_TABLET | Freq: Every day | ORAL | Status: DC
  Administered 2022-12-05 – 2022-12-06 (×2): 10 mg via ORAL
  Filled 2022-12-05 (×2): qty 1

## 2022-12-05 MED ORDER — FENOFIBRATE 160 MG PO TABS: 160.0000 mg | ORAL_TABLET | Freq: Every day | ORAL | Status: DC

## 2022-12-05 MED ORDER — RISAQUAD PO CAPS
1.0000 | ORAL_CAPSULE | Freq: Every day | ORAL | Status: DC
  Administered 2022-12-06: 1 via ORAL
  Filled 2022-12-05: qty 1

## 2022-12-05 MED ORDER — LEVOCETIRIZINE DIHYDROCHLORIDE 5 MG PO TABS: 5.0000 mg | ORAL_TABLET | Freq: Every evening | ORAL | Status: DC

## 2022-12-05 MED ORDER — DICYCLOMINE HCL 10 MG PO CAPS
20.0000 mg | ORAL_CAPSULE | Freq: Three times a day (TID) | ORAL | Status: DC
  Administered 2022-12-05 – 2022-12-06 (×6): 20 mg via ORAL
  Filled 2022-12-05 (×6): qty 2

## 2022-12-05 MED ORDER — SIMVASTATIN 20 MG PO TABS
40.0000 mg | ORAL_TABLET | Freq: Every day | ORAL | Status: DC
  Administered 2022-12-05: 40 mg via ORAL
  Filled 2022-12-05: qty 2

## 2022-12-05 MED ORDER — DULOXETINE HCL 60 MG PO CPEP
90.0000 mg | ORAL_CAPSULE | Freq: Every day | ORAL | Status: DC
  Administered 2022-12-06: 90 mg via ORAL
  Filled 2022-12-05: qty 1

## 2022-12-05 MED ORDER — TRAZODONE HCL 50 MG PO TABS
150.0000 mg | ORAL_TABLET | Freq: Every day | ORAL | Status: DC
  Administered 2022-12-05: 150 mg via ORAL
  Filled 2022-12-05: qty 3

## 2022-12-05 MED ORDER — DULOXETINE HCL 30 MG PO CPEP: 30.0000 mg | ORAL_CAPSULE | Freq: Every morning | ORAL | Status: DC

## 2022-12-05 MED ORDER — ALBUTEROL SULFATE (2.5 MG/3ML) 0.083% IN NEBU: 3.0000 mL | INHALATION_SOLUTION | Freq: Four times a day (QID) | RESPIRATORY_TRACT | Status: DC | PRN

## 2022-12-05 MED ORDER — DIPHENOXYLATE-ATROPINE 2.5-0.025 MG PO TABS
1.0000 | ORAL_TABLET | Freq: Four times a day (QID) | ORAL | Status: DC | PRN
  Administered 2022-12-05: 1 via ORAL
  Filled 2022-12-05: qty 1

## 2022-12-05 MED ORDER — ACETAMINOPHEN 650 MG RE SUPP: 650.0000 mg | Freq: Four times a day (QID) | RECTAL | Status: DC | PRN

## 2022-12-05 MED ORDER — DICYCLOMINE HCL 20 MG PO TABS
20.0000 mg | ORAL_TABLET | Freq: Three times a day (TID) | ORAL | Status: DC
  Filled 2022-12-05 (×6): qty 1

## 2022-12-05 MED ORDER — DULOXETINE HCL 60 MG PO CPEP: 60.0000 mg | ORAL_CAPSULE | Freq: Every morning | ORAL | Status: DC

## 2022-12-05 MED ORDER — QUETIAPINE FUMARATE 100 MG PO TABS
100.0000 mg | ORAL_TABLET | Freq: Every day | ORAL | Status: DC
  Administered 2022-12-05: 100 mg via ORAL
  Filled 2022-12-05: qty 1

## 2022-12-05 NOTE — H&P (Signed)
History and Physical    DAVEAH KAPELA OZH:[id number] DOB: April 02, 1961 DOA: 12/05/2022  PCP: Wilmon Pali, FNP   Patient coming from: Home  Chief Complaint: N/V/D  HPI: Sabrina Beasley is a 62 y.o. female with medical history significant for hypertension, dyslipidemia, prediabetes, obesity, anxiety/depression, and GERD who presented to the ED with complaints of some nausea, vomiting, and diarrhea since Tuesday of this week.  She states that she took her Ozempic shot as normally scheduled that day and was also noted to have some constipation about 1 to 2 days previously and therefore she took quite a bit of MiraLAX to assist with her symptoms.  She denies any strange oral intake or sick contacts.  She feels as though her Ozempic is causing a significant amount of symptoms.   ED Course: Vital signs with some tachycardia and leukocytosis of 12,900 noted.  Sodium 132.  Creatinine 2.57 and previously noted to be closer to 1, but this was about a year ago.  Review of Systems: Reviewed as noted above, otherwise negative.  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)    Bulging of cervical intervertebral disc    C4-5 &  C6-7   Constipation    DDD (degenerative disc disease), cervical    stenosis   Depression    Family history of premature CAD    GERD (gastroesophageal reflux disease)    Gout    History of endometriosis    History of kidney stones     right side w associated n/v   History of ovarian cyst    History of rheumatic fever    Hx of hepatitis C dx 04-16-2009 liver bx   mild portal/ periportal fibrosis-- lower serum level detection positive antibody virus RNA   Hyperlipidemia    Hypertension    OA (osteoarthritis)    Osteoporosis    PONV (postoperative nausea and vomiting)    pt states scopalomine patches help   Pre-diabetes    Right ureteral stone     Past Surgical History:  Procedure Laterality Date   CARDIOVASCULAR  STRESS TEST  06/19/2015  dr Purvis Sheffield   normal nuclear study, low risk w/ overall LVSF normal and wall motion normal , stress ef 57%   CARPAL TUNNEL RELEASE Right x2  last one   CARPAL TUNNEL RELEASE Left 02/23/2013   Procedure: CARPAL TUNNEL RELEASE;  Surgeon: Vickki Hearing, MD;  Location: [facility];  Service: Orthopedics;  Laterality: Left;   CHOLECYSTECTOMY  1999   COLONOSCOPY  01/2012   Dr. Allena Katz: tortous colon, multiple colon polyps, 2 cauterized in ascending colon, one transverse colon adenomatous, sigmoid polyps (6) hyperplastic.    COLONOSCOPY WITH PROPOFOL N/A 12/25/2020   Procedure: COLONOSCOPY WITH PROPOFOL;  Surgeon: Corbin Ade, MD;  Location: [facility];  Service: Endoscopy;  Laterality: N/A;  11:15am   CYSTOSCOPY W/ RETROGRADES Right 05/07/2016   Procedure: CYSTOSCOPY WITH RETROGRADE PYELOGRAM;  Surgeon: Jerilee Field, MD;  Location: [facility];  Service: Urology;  Laterality: Right;   DX LAPAROSCOPY/  D & C HYSTEROSCOPY  2001   ESOPHAGOGASTRODUODENOSCOPY  05/2012   Dr. Allena Katz: esophagitis, gastritis, no hpylori   LAPAROSCOPIC SALPINGO OOPHERECTOMY Right 08/24/2006   ORIF ANKLE FRACTURE Left    POLYPECTOMY  12/25/2020   Procedure: POLYPECTOMY;  Surgeon: Corbin Ade, MD;  Location: [facility];  Service: Endoscopy;;   RECTOCELE REPAIR N/A 01/13/2016  Procedure: POSTERIOR VAGINAL REPAIR (RECTOCELE);  Surgeon: Tilda Burrow, MD;  Location: [facility];  Service: Gynecology;  Laterality: N/A;   TOTAL ABDOMINAL HYSTERECTOMY Left 05/22/2002   w/  Left Salpingo Oopherectomy   TRANSTHORACIC ECHOCARDIOGRAM  06/19/2015   ef 60-65%/  trivial MR and PR   TUBAL LIGATION  1989     reports that she quit smoking about 4 years ago. Her smoking use included cigarettes. She started smoking about 44 years ago. She has a 19.8 pack-year smoking history. She has been exposed to tobacco smoke. She has never used smokeless tobacco. She reports that she does not drink  alcohol and does not use drugs.  Allergies  Allergen Reactions   Corticosteroids Other (See Comments)    With steroids (mood changes (crankiness), shakiness)   Nitrofurantoin Monohyd Macro Nausea And Vomiting    Also gets shaky   Sulfa Antibiotics Rash    Family History  Problem Relation Age of Onset   Hypertension Mother    Hyperlipidemia Mother    Heart disease Father    Heart disease Brother        passed age 69 from heart valve complication of some sort   Colon polyps Brother    Colon polyps Brother        deceased age 53 from "unknown"   Colon cancer Neg Hx     Prior to Admission medications   Medication Sig Start Date End Date Taking? Authorizing Provider  albuterol (VENTOLIN HFA) 108 (90 Base) MCG/ACT inhaler Inhale 1-2 puffs into the lungs every 6 (six) hours as needed for wheezing or shortness of breath. 11/08/21   Particia Nearing, PA-C  butalbital-acetaminophen-caffeine (FIORICET, ESGIC) [phone] MG tablet Take 1 tablet by mouth at bedtime. 11/14/17   [provider]  Cholecalciferol (VITAMIN D3) 50 MCG (2000 UT) capsule Take 2,000 Units by mouth daily. 07/14/20   [provider]  DEXILANT 60 MG capsule Take 1 capsule by mouth once daily 07/28/18   Gelene Mink, NP  dicyclomine (BENTYL) 20 MG tablet Take 1 tablet (20 mg total) by mouth 2 (two) times daily as needed for spasms. 06/12/21   Carroll Sage, PA-C  diphenoxylate-atropine (LOMOTIL) 2.5-0.025 MG tablet Take 1 tablet by mouth 4 (four) times daily as needed for diarrhea or loose stools. 12/03/22   Particia Nearing, PA-C  DULoxetine (CYMBALTA) 30 MG capsule Take 30 mg by mouth in the morning. 30 mg + 60 mg=90 mg 06/30/20   [provider]  DULoxetine (CYMBALTA) 60 MG capsule Take 60 mg by mouth in the morning. 60 mg + 30 mg=90 mg 06/08/17   [provider]  fenofibrate (TRICOR) 145 MG tablet Take 145 mg by mouth at bedtime. 10/16/20   [provider]   hydrochlorothiazide (MICROZIDE) 12.5 MG capsule Take 12.5 mg by mouth in the morning. 06/08/18   [provider]  hydrOXYzine (VISTARIL) 25 MG capsule Take 25 mg by mouth at bedtime.    [provider]  levocetirizine (XYZAL) 5 MG tablet Take 5 mg by mouth every evening.    [provider]  lisinopril (PRINIVIL,ZESTRIL) 40 MG tablet Take 40 mg by mouth in the morning. 04/27/18   [provider]  Multiple Vitamin (MULTIVITAMIN WITH MINERALS) TABS tablet Take 1 tablet by mouth in the morning.    [provider]  pantoprazole (PROTONIX) 40 MG tablet Take 1 tablet (40 mg total) by mouth daily. Take one tablet daily 30 minutes before breaksfast 04/14/22  Rourk, Gerrit Friends, MD  Probiotic TBEC Take 1 capsule by mouth in the morning.    [provider]  QUEtiapine (SEROQUEL) 100 MG tablet Take 100 mg by mouth daily with supper.    [provider]  Semaglutide,0.25 or 0.5MG /DOS, (OZEMPIC, 0.25 OR 0.5 MG/DOSE,) 2 MG/1.5ML SOPN Inject into the skin.    [provider]  simvastatin (ZOCOR) 40 MG tablet Take 40 mg by mouth at bedtime.    [provider]  traZODone (DESYREL) 150 MG tablet Take 150 mg by mouth at bedtime.    [provider]    Physical Exam: Vitals:   12/05/22 0631 12/05/22 0808 12/05/22 0842 12/05/22 0849  BP:  94/64 100/65   Pulse:  99 100   Resp:   20   Temp:  98.1 F (36.7 C) 98.6 F (37 C)   TempSrc:  Oral Oral   SpO2:  97% 99%   Weight: 65.8 kg   67.9 kg  Height: 4\' 11"  (1.499 m)   4\' 11"  (1.499 m)    Constitutional: NAD, calm, comfortable Vitals:   12/05/22 0631 12/05/22 0808 12/05/22 0842 12/05/22 0849  BP:  94/64 100/65   Pulse:  99 100   Resp:   20   Temp:  98.1 F (36.7 C) 98.6 F (37 C)   TempSrc:  Oral Oral   SpO2:  97% 99%   Weight: 65.8 kg   67.9 kg  Height: 4\' 11"  (1.499 m)   4\' 11"  (1.499 m)   Eyes: lids and conjunctivae normal Neck: normal, supple Respiratory: clear  to auscultation bilaterally. Normal respiratory effort. No accessory muscle use.  Cardiovascular: Regular rate and rhythm, no murmurs. Abdomen: no tenderness, no distention. Bowel sounds positive.  Musculoskeletal:  No edema. Skin: no rashes, lesions, ulcers.  Psychiatric: Flat affect  Labs on Admission: I have personally reviewed following labs and imaging studies  CBC: Recent Labs  Lab 12/05/22 0710  WBC 12.9*  HGB 12.2  HCT 35.3*  MCV 85.5  PLT 474*   Basic Metabolic Panel: Recent Labs  Lab 12/03/22 1135 12/05/22 0710  NA 140 132*  K 5.2 3.7  CL 101 99  CO2 22 25  GLUCOSE 103* 132*  BUN 46* 50*  CREATININE 2.39* 2.57*  CALCIUM 10.8* 9.5   GFR: Estimated Creatinine Clearance: 19.8 mL/min (A) (by C-G formula based on SCr of 2.57 mg/dL (H)). Liver Function Tests: Recent Labs  Lab 12/03/22 1135 12/05/22 0710  AST 41* 36  ALT 40* 35  ALKPHOS 44 37*  BILITOT 0.4 1.0  PROT 7.0 7.5  ALBUMIN 4.5 4.3   Recent Labs  Lab 12/05/22 0710  LIPASE 49   No results for input(s): "AMMONIA" in the last 168 hours. Coagulation Profile: No results for input(s): "INR", "PROTIME" in the last 168 hours. Cardiac Enzymes: No results for input(s): "CKTOTAL", "CKMB", "CKMBINDEX", "TROPONINI" in the last 168 hours. BNP (last 3 results) No results for input(s): "PROBNP" in the last 8760 hours. HbA1C: No results for input(s): "HGBA1C" in the last 72 hours. CBG: No results for input(s): "GLUCAP" in the last 168 hours. Lipid Profile: No results for input(s): "CHOL", "HDL", "LDLCALC", "TRIG", "CHOLHDL", "LDLDIRECT" in the last 72 hours. Thyroid Function Tests: No results for input(s): "TSH", "T4TOTAL", "FREET4", "T3FREE", "THYROIDAB" in the last 72 hours. Anemia Panel: No results for input(s): "VITAMINB12", "FOLATE", "FERRITIN", "TIBC", "IRON", "RETICCTPCT" in the last 72 hours. Urine analysis:    Component Value Date/Time   COLORURINE YELLOW 05/03/2016 1845  APPEARANCEUR  CLEAR 05/03/2016 1845   LABSPEC 1.017 05/03/2016 1845   PHURINE 5.0 05/03/2016 1845   GLUCOSEU NEGATIVE 05/03/2016 1845   HGBUR LARGE (A) 05/03/2016 1845   BILIRUBINUR neg 06/24/2020 1048   KETONESUR NEGATIVE 05/03/2016 1845   PROTEINUR Negative 06/24/2020 1048   PROTEINUR NEGATIVE 05/03/2016 1845   UROBILINOGEN 0.2 06/24/2020 1048   UROBILINOGEN 0.2 12/30/2013 1343   NITRITE neg 06/24/2020 1048   NITRITE NEGATIVE 05/03/2016 1845   LEUKOCYTESUR Negative 06/24/2020 1048    Radiological Exams on Admission: No results found.   Assessment/Plan Principal Problem:   AKI (acute kidney injury) (HCC) Active Problems:   Hyperlipidemia   GERD (gastroesophageal reflux disease)   Anxiety   Hypertension    AKI-prerenal Continue IV fluid and monitor labs Hold HCTZ and lisinopril  N/V/D Possibly related to gastroenteritis Start clear liquid diet and advance as tolerated Check stool sample for pathogen panel and C. Difficile Zofran as needed for nausea or vomiting Bentyl for cramping pain  Hypertension Hold nephrotoxic agents Hydralazine as needed  Dyslipidemia Continue statin and fenofibrate  GERD PPI  Anxiety/depression Continue home medications  Obesity Currently on Ozempic, likely hold further use on discharge  DVT prophylaxis: Heparin Code Status: Full Family Communication: Husband at bedside 7/28 Disposition Plan:Admit for AKI Consults called:None Admission status: Obs, MedSurg  Severity of Illness: The appropriate patient status for this patient is OBSERVATION. Observation status is judged to be reasonable and necessary in order to provide the required intensity of service to ensure the patient's safety. The patient's presenting symptoms, physical exam findings, and initial radiographic and laboratory data in the context of their medical condition is felt to place them at decreased risk for further clinical deterioration. Furthermore, it is anticipated that the  patient will be medically stable for discharge from the hospital within 2 midnights of admission.    Cary Lothrop D Sherryll Burger DO [facility]  If 7PM-7AM, please contact night-coverage [email or web address]  12/05/2022, 10:30 AM

## 2022-12-05 NOTE — ED Provider Notes (Signed)
[facility] AT [facility] Provider Note   CSN: [id number] Arrival date & time: 12/05/22  9528     History {Add pertinent medical, surgical, social history, OB history to HPI:1} Chief Complaint  Patient presents with   Diarrhea    Sabrina Beasley is a 62 y.o. female.  Patient has a history of prediabetes and hypertension.  She also takes Ozempic.  Patient has been having diarrhea since Tuesday and vomiting today.  No abdominal pain   Diarrhea      Home Medications Prior to Admission medications   Medication Sig Start Date End Date Taking? Authorizing Provider  albuterol (VENTOLIN HFA) 108 (90 Base) MCG/ACT inhaler Inhale 1-2 puffs into the lungs every 6 (six) hours as needed for wheezing or shortness of breath. 11/08/21   Particia Nearing, PA-C  butalbital-acetaminophen-caffeine (FIORICET, ESGIC) [phone] MG tablet Take 1 tablet by mouth at bedtime. 11/14/17   [provider]  Cholecalciferol (VITAMIN D3) 50 MCG (2000 UT) capsule Take 2,000 Units by mouth daily. 07/14/20   [provider]  DEXILANT 60 MG capsule Take 1 capsule by mouth once daily 07/28/18   Gelene Mink, NP  dicyclomine (BENTYL) 20 MG tablet Take 1 tablet (20 mg total) by mouth 2 (two) times daily as needed for spasms. 06/12/21   Carroll Sage, PA-C  diphenoxylate-atropine (LOMOTIL) 2.5-0.025 MG tablet Take 1 tablet by mouth 4 (four) times daily as needed for diarrhea or loose stools. 12/03/22   Particia Nearing, PA-C  DULoxetine (CYMBALTA) 30 MG capsule Take 30 mg by mouth in the morning. 30 mg + 60 mg=90 mg 06/30/20   [provider]  DULoxetine (CYMBALTA) 60 MG capsule Take 60 mg by mouth in the morning. 60 mg + 30 mg=90 mg 06/08/17   [provider]  fenofibrate (TRICOR) 145 MG tablet Take 145 mg by mouth at bedtime. 10/16/20   [provider]  hydrochlorothiazide (MICROZIDE) 12.5 MG capsule Take 12.5 mg by mouth in the morning.  06/08/18   [provider]  hydrOXYzine (VISTARIL) 25 MG capsule Take 25 mg by mouth at bedtime.    [provider]  levocetirizine (XYZAL) 5 MG tablet Take 5 mg by mouth every evening.    [provider]  lisinopril (PRINIVIL,ZESTRIL) 40 MG tablet Take 40 mg by mouth in the morning. 04/27/18   [provider]  Multiple Vitamin (MULTIVITAMIN WITH MINERALS) TABS tablet Take 1 tablet by mouth in the morning.    [provider]  pantoprazole (PROTONIX) 40 MG tablet Take 1 tablet (40 mg total) by mouth daily. Take one tablet daily 30 minutes before breaksfast 04/14/22   Rourk, Gerrit Friends, MD  Probiotic TBEC Take 1 capsule by mouth in the morning.    [provider]  QUEtiapine (SEROQUEL) 100 MG tablet Take 100 mg by mouth daily with supper.    [provider]  Semaglutide,0.25 or 0.5MG /DOS, (OZEMPIC, 0.25 OR 0.5 MG/DOSE,) 2 MG/1.5ML SOPN Inject into the skin.    [provider]  simvastatin (ZOCOR) 40 MG tablet Take 40 mg by mouth at bedtime.    [provider]  traZODone (DESYREL) 150 MG tablet Take 150 mg by mouth at bedtime.    [provider]      Allergies    Corticosteroids, Nitrofurantoin monohyd macro, and Sulfa antibiotics    Review of Systems   Review of Systems  Gastrointestinal:  Positive for diarrhea.    Physical Exam Updated  Vital Signs BP 94/64   Pulse 99   Temp 98.1 F (36.7 C) (Oral)   Resp 18   Ht 4\' 11"  (1.499 m)   Wt 65.8 kg   SpO2 97%   BMI 29.29 kg/m  Physical Exam  ED Results / Procedures / Treatments   Labs (all labs ordered are listed, but only abnormal results are displayed) Labs Reviewed  COMPREHENSIVE METABOLIC PANEL - Abnormal; Notable for the following components:      Result Value   Sodium 132 (*)    Glucose, Bld 132 (*)    BUN 50 (*)    Creatinine, Ser 2.57 (*)    Alkaline Phosphatase 37 (*)    GFR, Estimated 21 (*)    All other components within normal  limits  CBC - Abnormal; Notable for the following components:   WBC 12.9 (*)    HCT 35.3 (*)    Platelets 474 (*)    All other components within normal limits  C DIFFICILE QUICK SCREEN W PCR REFLEX    GASTROINTESTINAL PANEL BY PCR, STOOL (REPLACES STOOL CULTURE)  LIPASE, BLOOD    EKG None  Radiology No results found.  Procedures Procedures  {Document cardiac monitor, telemetry assessment procedure when appropriate:1}  Medications Ordered in ED Medications  ondansetron (ZOFRAN) injection 4 mg (has no administration in time range)  dicyclomine (BENTYL) tablet 20 mg (has no administration in time range)  0.9 %  sodium chloride infusion (has no administration in time range)  sodium chloride 0.9 % bolus 1,000 mL (1,000 mLs Intravenous New Bag/Given 12/05/22 0711)  ondansetron (ZOFRAN) injection 4 mg (4 mg Intravenous Given 12/05/22 0710)    ED Course/ Medical Decision Making/ A&P   {   Click here for ABCD2, HEART and other calculatorsREFRESH Note before signing :1}                          Medical Decision Making Amount and/or Complexity of Data Reviewed Labs: ordered.  Risk Decision regarding hospitalization.   Patient with an AKI.  She will be admitted to medicine and hydrated  {Document critical care time when appropriate:1} {Document review of labs and clinical decision tools ie heart score, Chads2Vasc2 etc:1}  {Document your independent review of radiology images, and any outside records:1} {Document your discussion with family members, caretakers, and with consultants:1} {Document social determinants of health affecting pt's care:1} {Document your decision making why or why not admission, treatments were needed:1} Final Clinical Impression(s) / ED Diagnoses Final diagnoses:  None    Rx / DC Orders ED Discharge Orders     None

## 2022-12-05 NOTE — Plan of Care (Signed)
  Problem: Education: Goal: Knowledge of General Education information will improve Description: Including pain rating scale, medication(s)/side effects and non-pharmacologic comfort measures Outcome: Progressing   Problem: Activity: Goal: Risk for activity intolerance will decrease Outcome: Progressing   Problem: Nutrition: Goal: Adequate nutrition will be maintained Outcome: Progressing   

## 2022-12-05 NOTE — ED Triage Notes (Signed)
Pt stated that she has had diarrhea since Tuesday and some vomiting that started yesterday. She was seen by her primary who told her to bring a stool sample in which she has with her currently.

## 2022-12-05 NOTE — ED Notes (Signed)
ED TO INPATIENT HANDOFF REPORT  ED Nurse Name and Phone #: [phone] [name] Name/Age/Gender Sabrina Beasley 62 y.o. female Room/Bed: APA14/APA14  Code Status   Code Status: Not on file  Home/SNF/Other Home Patient oriented to: self, place, time, and situation Is this baseline? Yes   Triage Complete: Triage complete  Chief Complaint AKI (acute kidney injury) [facility]) [N17.9]  Triage Note Pt stated that she has had diarrhea since Tuesday and some vomiting that started yesterday. She was seen by her primary who told her to bring a stool sample in which she has with her currently.    Allergies Allergies  Allergen Reactions   Corticosteroids Other (See Comments)    With steroids (mood changes (crankiness), shakiness)   Nitrofurantoin Monohyd Macro Nausea And Vomiting    Also gets shaky   Sulfa Antibiotics Rash    Level of Care/Admitting Diagnosis ED Disposition     ED Disposition  Admit   Condition  --   Comment  Hospital Area: [facility] [[id number]]  Level of Care: Med-Surg [16]  Covid Evaluation: Asymptomatic - no recent exposure (last 10 days) testing not required  Diagnosis: AKI (acute kidney injury) [facility]) [[id number]]  Admitting Physician: Erick Blinks [[id number]]  Attending Physician: Erick Blinks [[id number]]          B Medical/Surgery History 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)    Bulging of cervical intervertebral disc    C4-5 &  C6-7   Constipation    DDD (degenerative disc disease), cervical    stenosis   Depression    Family history of premature CAD    GERD (gastroesophageal reflux disease)    Gout    History of endometriosis    History of kidney stones     right side w associated n/v   History of ovarian cyst    History of rheumatic fever    Hx of hepatitis C dx 04-16-2009 liver bx   mild portal/ periportal fibrosis-- lower serum level detection positive antibody virus RNA    Hyperlipidemia    Hypertension    OA (osteoarthritis)    Osteoporosis    PONV (postoperative nausea and vomiting)    pt states scopalomine patches help   Pre-diabetes    Right ureteral stone    Past Surgical History:  Procedure Laterality Date   CARDIOVASCULAR STRESS TEST  06/19/2015  dr Purvis Sheffield   normal nuclear study, low risk w/ overall LVSF normal and wall motion normal , stress ef 57%   CARPAL TUNNEL RELEASE Right x2  last one   CARPAL TUNNEL RELEASE Left 02/23/2013   Procedure: CARPAL TUNNEL RELEASE;  Surgeon: Vickki Hearing, MD;  Location: [facility];  Service: Orthopedics;  Laterality: Left;   CHOLECYSTECTOMY  1999   COLONOSCOPY  01/2012   Dr. Allena Katz: tortous colon, multiple colon polyps, 2 cauterized in ascending colon, one transverse colon adenomatous, sigmoid polyps (6) hyperplastic.    COLONOSCOPY WITH PROPOFOL N/A 12/25/2020   Procedure: COLONOSCOPY WITH PROPOFOL;  Surgeon: Corbin Ade, MD;  Location: [facility];  Service: Endoscopy;  Laterality: N/A;  11:15am   CYSTOSCOPY W/ RETROGRADES Right 05/07/2016   Procedure: CYSTOSCOPY WITH RETROGRADE PYELOGRAM;  Surgeon: Jerilee Field, MD;  Location: [facility];  Service: Urology;  Laterality: Right;   DX LAPAROSCOPY/  D & C HYSTEROSCOPY  2001   ESOPHAGOGASTRODUODENOSCOPY  05/2012   Dr. Allena Katz: esophagitis,  gastritis, no hpylori   LAPAROSCOPIC SALPINGO OOPHERECTOMY Right 08/24/2006   ORIF ANKLE FRACTURE Left    POLYPECTOMY  12/25/2020   Procedure: POLYPECTOMY;  Surgeon: Corbin Ade, MD;  Location: [facility];  Service: Endoscopy;;   RECTOCELE REPAIR N/A 01/13/2016   Procedure: POSTERIOR VAGINAL REPAIR (RECTOCELE);  Surgeon: Tilda Burrow, MD;  Location: [facility];  Service: Gynecology;  Laterality: N/A;   TOTAL ABDOMINAL HYSTERECTOMY Left 05/22/2002   w/  Left Salpingo Oopherectomy   TRANSTHORACIC ECHOCARDIOGRAM  06/19/2015   ef 60-65%/  trivial MR and PR   TUBAL LIGATION  1989     A IV  Location/Drains/Wounds Patient Lines/Drains/Airways Status     Active Line/Drains/Airways     Name Placement date Placement time Site Days   Peripheral IV 12/05/22 20 G Left Antecubital 12/05/22  0700  Antecubital  less than 1            Intake/Output Last 24 hours No intake or output data in the 24 hours ending 12/05/22 9147  Labs/Imaging Results for orders placed or performed during the hospital encounter of 12/05/22 (from the past 48 hour(s))  Lipase, blood     Status: None   Collection Time: 12/05/22  7:10 AM  Result Value Ref Range   Lipase 49 11 - 51 U/L    Comment: Performed at [facility], [address] [place], [place] [address]  Comprehensive metabolic panel     Status: Abnormal   Collection Time: 12/05/22  7:10 AM  Result Value Ref Range   Sodium 132 (L) 135 - 145 mmol/L   Potassium 3.7 3.5 - 5.1 mmol/L   Chloride 99 98 - 111 mmol/L   CO2 25 22 - 32 mmol/L   Glucose, Bld 132 (H) 70 - 99 mg/dL    Comment: Glucose reference range applies only to samples taken after fasting for at least 8 hours.   BUN 50 (H) 6 - 20 mg/dL   Creatinine, Ser 2.13 (H) 0.44 - 1.00 mg/dL   Calcium 9.5 8.9 - 08.6 mg/dL   Total Protein 7.5 6.5 - 8.1 g/dL   Albumin 4.3 3.5 - 5.0 g/dL   AST 36 15 - 41 U/L   ALT 35 0 - 44 U/L   Alkaline Phosphatase 37 (L) 38 - 126 U/L   Total Bilirubin 1.0 0.3 - 1.2 mg/dL   GFR, Estimated 21 (L) >60 mL/min    Comment: (NOTE) Calculated using the CKD-EPI Creatinine Equation (2021)    Anion gap 8 5 - 15    Comment: Performed at [facility], [address], [place], [place] [address]  CBC     Status: Abnormal   Collection Time: 12/05/22  7:10 AM  Result Value Ref Range   WBC 12.9 (H) 4.0 - 10.5 K/uL   RBC 4.13 3.87 - 5.11 MIL/uL   Hemoglobin 12.2 12.0 - 15.0 g/dL   HCT 96.2 (L) 95.2 - 84.1 %   MCV 85.5 80.0 - 100.0 fL   MCH 29.5 26.0 - 34.0 pg   MCHC 34.6 30.0 - 36.0 g/dL   RDW 32.4 40.1 - 02.7 %   Platelets 474 (H) 150 - 400 K/uL   nRBC 0.0  0.0 - 0.2 %    Comment: Performed at [facility], [address], [place], [place] [address]   No results found.  Pending Labs Unresulted Labs (From admission, onward)    None       Vitals/Pain Today's Vitals   12/05/22 4403 12/05/22  0631 12/05/22 0808  BP: 103/67  94/64  Pulse: (!) 129  99  Resp: 18    Temp: 98 F (36.7 C)  98.1 F (36.7 C)  TempSrc:   Oral  SpO2: 94%  97%  Weight:  65.8 kg   Height:  4\' 11"  (1.499 m)   PainSc:  0-No pain     Isolation Precautions No active isolations  Medications Medications  sodium chloride 0.9 % bolus 1,000 mL (1,000 mLs Intravenous New Bag/Given 12/05/22 0711)  ondansetron (ZOFRAN) injection 4 mg (4 mg Intravenous Given 12/05/22 0710)    Mobility walks     Focused Assessments Dehydration, diarrhea, N&V   R Recommendations: See Admitting Provider Note  Report given to:   Additional Notes: pt complains of diarrhea, nausea, and vomiting since Tuesday.

## 2022-12-06 DIAGNOSIS — N179 Acute kidney failure, unspecified: Secondary | ICD-10-CM | POA: Diagnosis not present

## 2022-12-06 LAB — IRON AND TIBC
Iron: 123 ug/dL (ref 28–170)
Saturation Ratios: 34 % — ABNORMAL HIGH (ref 10.4–31.8)
TIBC: 359 ug/dL (ref 250–450)
UIBC: 236 ug/dL

## 2022-12-06 LAB — FOLATE: Folate: 14.1 ng/mL (ref >5.9)

## 2022-12-06 LAB — RETICULOCYTES
Immature Retic Fract: 2.4 % (ref 2.3–15.9)
RBC.: 3.16 MIL/uL — ABNORMAL LOW (ref 3.87–5.11)
Retic Count, Absolute: 25 10*3/uL (ref 19.0–186.0)
Retic Ct Pct: 0.8 % (ref 0.4–3.1)

## 2022-12-06 LAB — VITAMIN B12: Vitamin B-12: 274 pg/mL (ref 180–914)

## 2022-12-06 LAB — FERRITIN: Ferritin: 134 ng/mL (ref 11–307)

## 2022-12-06 MED ORDER — POTASSIUM CHLORIDE CRYS ER 20 MEQ PO TBCR
40.0000 meq | EXTENDED_RELEASE_TABLET | Freq: Two times a day (BID) | ORAL | Status: DC
  Administered 2022-12-06: 40 meq via ORAL
  Filled 2022-12-06: qty 2

## 2022-12-06 MED ORDER — ONDANSETRON HCL 4 MG PO TABS: 4.0000 mg | ORAL_TABLET | Freq: Every day | ORAL | 1 refills | Status: AC | PRN

## 2022-12-06 MED ORDER — DICYCLOMINE HCL 10 MG PO CAPS: 20.0000 mg | ORAL_CAPSULE | Freq: Three times a day (TID) | ORAL | 0 refills | Status: AC

## 2022-12-06 MED ORDER — MAGNESIUM SULFATE 4 GM/100ML IV SOLN
4.0000 g | Freq: Once | INTRAVENOUS | Status: AC
  Administered 2022-12-06: 4 g via INTRAVENOUS
  Filled 2022-12-06: qty 100

## 2022-12-06 NOTE — Discharge Summary (Signed)
Physician Discharge Summary  Sabrina Beasley VHQ:[id number] DOB: 1960-07-23 DOA: 12/05/2022  PCP: Wilmon Pali, FNP  Admit date: 12/05/2022  Discharge date: 12/06/2022  Admitted From:Home  Disposition:  Home  Recommendations for Outpatient Follow-up:  Follow up with PCP in 1-2 weeks Hold hydrochlorothiazide and lisinopril for now until repeat BMP obtained in next 3-5 days to ensure Creatinine has returned to baseline. Continue on Bentyl and Zofran as needed for symptomatic management Hold off on further use of Ozempic until followed up by PCP Continue other medications as prior  Home Health: None  Equipment/Devices: None  Discharge Condition:Stable  CODE STATUS: Full  Diet recommendation: Heart Healthy  Brief/Interim Summary: Sabrina Beasley is a 62 y.o. female with medical history significant for hypertension, dyslipidemia, prediabetes, obesity, anxiety/depression, and GERD who presented to the ED with complaints of some nausea, vomiting, and diarrhea since Tuesday of this week.  She states that she took her Ozempic shot as normally scheduled that day and was also noted to have some constipation about 1 to 2 days previously and therefore she took quite a bit of MiraLAX to assist with her symptoms.  She denies any strange oral intake or sick contacts.  She feels as though her Ozempic is causing a significant amount of symptoms.   Patient was admitted with prerenal AKI in the setting of GI losses due to suspected viral gastroenteritis as well as possible Ozempic use.  Her creatinine levels are improving on IV fluid and she will have some magnesium repletion on the day of discharge.  She no longer has any further symptoms and is tolerating diet.  No other acute events or concerns noted and she is in stable condition for discharge.  Discharge Diagnoses:  Principal Problem:   AKI (acute kidney injury) (HCC) Active Problems:   Hyperlipidemia   GERD (gastroesophageal reflux disease)    Anxiety   Hypertension  Principal discharge diagnosis: Prerenal AKI in the setting of GI losses suspect secondary to viral gastroenteritis complicated by Ozempic use.  Discharge Instructions  Discharge Instructions     Diet - low sodium heart healthy   Complete by: As directed    Increase activity slowly   Complete by: As directed       Allergies as of 12/06/2022       Reactions   Corticosteroids Other (See Comments)   With steroids (mood changes (crankiness), shakiness)   Nitrofurantoin Monohyd Macro Nausea And Vomiting   Also gets shaky   Sulfa Antibiotics Rash        Medication List     STOP taking these medications    hydrochlorothiazide 12.5 MG capsule Commonly known as: MICROZIDE   lisinopril 40 MG tablet Commonly known as: ZESTRIL   omeprazole 20 MG capsule Commonly known as: PRILOSEC   Ozempic (0.25 or 0.5 MG/DOSE) 2 MG/1.5ML Sopn Generic drug: Semaglutide(0.25 or 0.5MG /DOS)       TAKE these medications    albuterol 108 (90 Base) MCG/ACT inhaler Commonly known as: VENTOLIN HFA Inhale 1-2 puffs into the lungs every 6 (six) hours as needed for wheezing or shortness of breath.   atenolol 25 MG tablet Commonly known as: TENORMIN Take 25 mg by mouth daily.   butalbital-acetaminophen-caffeine 50-325-40 MG tablet Commonly known as: FIORICET Take 1 tablet by mouth at bedtime.   Dexilant 60 MG capsule Generic drug: dexlansoprazole Take 1 capsule by mouth once daily   dicyclomine 20 MG tablet Commonly known as: BENTYL Take 1 tablet (20 mg total) by mouth  2 (two) times daily as needed for spasms. What changed: Another medication with the same name was added. Make sure you understand how and when to take each.   dicyclomine 10 MG capsule Commonly known as: BENTYL Take 2 capsules (20 mg total) by mouth 4 (four) times daily -  before meals and at bedtime. What changed: You were already taking a medication with the same name, and this prescription was  added. Make sure you understand how and when to take each.   diphenoxylate-atropine 2.5-0.025 MG tablet Commonly known as: Lomotil Take 1 tablet by mouth 4 (four) times daily as needed for diarrhea or loose stools.   DULoxetine 60 MG capsule Commonly known as: CYMBALTA Take 60 mg by mouth in the morning. 60 mg + 30 mg=90 mg   DULoxetine 30 MG capsule Commonly known as: CYMBALTA Take 30 mg by mouth in the morning. 30 mg + 60 mg=90 mg   fenofibrate 145 MG tablet Commonly known as: TRICOR Take 145 mg by mouth at bedtime.   hydrOXYzine 25 MG capsule Commonly known as: VISTARIL Take 25 mg by mouth at bedtime.   levocetirizine 5 MG tablet Commonly known as: XYZAL Take 5 mg by mouth every evening.   multivitamin with minerals Tabs tablet Take 1 tablet by mouth in the morning.   ondansetron 4 MG disintegrating tablet Commonly known as: ZOFRAN-ODT Take 4 mg by mouth every 6 (six) hours.   ondansetron 4 MG tablet Commonly known as: Zofran Take 1 tablet (4 mg total) by mouth daily as needed for nausea or vomiting.   pantoprazole 40 MG tablet Commonly known as: PROTONIX Take 1 tablet (40 mg total) by mouth daily. Take one tablet daily 30 minutes before breaksfast   Probiotic 250 MG Caps Take 1 capsule by mouth daily.   QUEtiapine 100 MG tablet Commonly known as: SEROQUEL Take 100 mg by mouth daily with supper.   simvastatin 40 MG tablet Commonly known as: ZOCOR Take 40 mg by mouth at bedtime.   traZODone 150 MG tablet Commonly known as: DESYREL Take 150 mg by mouth at bedtime.   Vitamin D3 50 MCG (2000 UT) capsule Take 2,000 Units by mouth daily.        Follow-up Information     Wilmon Pali, FNP. Schedule an appointment as soon as possible for a visit in 1 week(s).   Specialty: Family Medicine Contact information: [address] [place] [address] [phone]                Allergies  Allergen Reactions   Corticosteroids Other (See  Comments)    With steroids (mood changes (crankiness), shakiness)   Nitrofurantoin Monohyd Macro Nausea And Vomiting    Also gets shaky   Sulfa Antibiotics Rash    Consultations: None   Procedures/Studies: No results found.   Discharge Exam: Vitals:   12/05/22 2240 12/06/22 0326  BP: (!) 85/60 110/62  Pulse: 84 94  Resp: 18 19  Temp:  98.4 F (36.9 C)  SpO2: 95% 99%   Vitals:   12/05/22 1346 12/05/22 1908 12/05/22 2240 12/06/22 0326  BP: 92/60 (!) 93/56 (!) 85/60 110/62  Pulse: 74 79 84 94  Resp: 20 18 18 19   Temp: 98.7 F (37.1 C) 98.1 F (36.7 C)  98.4 F (36.9 C)  TempSrc: Oral Oral    SpO2: 100% 95% 95% 99%  Weight:      Height:        General: Pt is  alert, awake, not in acute distress Cardiovascular: RRR, S1/S2 +, no rubs, no gallops Respiratory: CTA bilaterally, no wheezing, no rhonchi Abdominal: Soft, NT, ND, bowel sounds + Extremities: no edema, no cyanosis    The results of significant diagnostics from this hospitalization (including imaging, microbiology, ancillary and laboratory) are listed below for reference.     Microbiology: Recent Results (from the past 240 hour(s))  C Difficile Quick Screen w PCR reflex     Status: None   Collection Time: 12/05/22  8:55 AM   Specimen: STOOL  Result Value Ref Range Status   C Diff antigen NEGATIVE NEGATIVE Final   C Diff toxin NEGATIVE NEGATIVE Final   C Diff interpretation No C. difficile detected.  Final    Comment: Performed at [facility], [address], [place], [place] [address]     Labs: BNP (last 3 results) No results for input(s): "BNP" in the last 8760 hours. Basic Metabolic Panel: Recent Labs  Lab 12/03/22 1135 12/05/22 0710 12/06/22 0424  NA 140 132* 138  K 5.2 3.7 3.4*  CL 101 99 109  CO2 22 25 23   GLUCOSE 103* 132* 84  BUN 46* 50* 35*  CREATININE 2.39* 2.57* 1.78*  CALCIUM 10.8* 9.5 8.2*  MG  --   --  0.9*   Liver Function Tests: Recent Labs  Lab 12/03/22 1135  12/05/22 0710 12/06/22 0424  AST 41* 36 17  ALT 40* 35 22  ALKPHOS 44 37* 26*  BILITOT 0.4 1.0 0.8  PROT 7.0 7.5 5.6*  ALBUMIN 4.5 4.3 3.1*   Recent Labs  Lab 12/05/22 0710  LIPASE 49   No results for input(s): "AMMONIA" in the last 168 hours. CBC: Recent Labs  Lab 12/05/22 0710 12/06/22 0424  WBC 12.9* 5.9  HGB 12.2 9.3*  HCT 35.3* 28.2*  MCV 85.5 88.4  PLT 474* 362   Cardiac Enzymes: No results for input(s): "CKTOTAL", "CKMB", "CKMBINDEX", "TROPONINI" in the last 168 hours. BNP: Invalid input(s): "POCBNP" CBG: No results for input(s): "GLUCAP" in the last 168 hours. D-Dimer No results for input(s): "DDIMER" in the last 72 hours. Hgb A1c No results for input(s): "HGBA1C" in the last 72 hours. Lipid Profile No results for input(s): "CHOL", "HDL", "LDLCALC", "TRIG", "CHOLHDL", "LDLDIRECT" in the last 72 hours. Thyroid function studies No results for input(s): "TSH", "T4TOTAL", "T3FREE", "THYROIDAB" in the last 72 hours.  Invalid input(s): "FREET3" Anemia work up Recent Labs    12/06/22 0424  VITAMINB12 274  FOLATE 14.1  FERRITIN 134  TIBC 359  IRON 123  RETICCTPCT 0.8   Urinalysis    Component Value Date/Time   COLORURINE YELLOW 05/03/2016 1845   APPEARANCEUR CLEAR 05/03/2016 1845   LABSPEC 1.017 05/03/2016 1845   PHURINE 5.0 05/03/2016 1845   GLUCOSEU NEGATIVE 05/03/2016 1845   HGBUR LARGE (A) 05/03/2016 1845   BILIRUBINUR neg 06/24/2020 1048   KETONESUR NEGATIVE 05/03/2016 1845   PROTEINUR Negative 06/24/2020 1048   PROTEINUR NEGATIVE 05/03/2016 1845   UROBILINOGEN 0.2 06/24/2020 1048   UROBILINOGEN 0.2 12/30/2013 1343   NITRITE neg 06/24/2020 1048   NITRITE NEGATIVE 05/03/2016 1845   LEUKOCYTESUR Negative 06/24/2020 1048   Sepsis Labs Recent Labs  Lab 12/05/22 0710 12/06/22 0424  WBC 12.9* 5.9   Microbiology Recent Results (from the past 240 hour(s))  C Difficile Quick Screen w PCR reflex     Status: None   Collection Time: 12/05/22   8:55 AM   Specimen: STOOL  Result Value Ref Range Status  C Diff antigen NEGATIVE NEGATIVE Final   C Diff toxin NEGATIVE NEGATIVE Final   C Diff interpretation No C. difficile detected.  Final    Comment: Performed at [facility], [address], [place], [place] [address]     Time coordinating discharge: 35 minutes  SIGNED:   Erick Blinks, DO [facility] 12/06/2022, 10:45 AM  If 7PM-7AM, please contact night-coverage [email or web address]

## 2022-12-06 NOTE — Progress Notes (Signed)
   12/06/22 1012  TOC Brief Assessment  Insurance and Status Reviewed  Patient has primary care physician Yes  Home environment has been reviewed from home  Prior level of function: independent  Prior/Current Home Services No current home services  Social Determinants of Health Reivew SDOH reviewed no interventions necessary  Readmission risk has been reviewed Yes  Transition of care needs no transition of care needs at this time    Transition of Care Department [facility]) has reviewed patient and no TOC needs have been identified at this time. We will continue to monitor patient advancement through interdisciplinary progression rounds. If new patient transition needs arise, please place a TOC consult.

## 2022-12-06 NOTE — Care Management Obs Status (Signed)
MEDICARE OBSERVATION STATUS NOTIFICATION   Patient Details  Name: BRIGITTE HOPP MRN: [id number] Date of Birth: [birth year]   Medicare Observation Status Notification Given:  Yes    Corey Harold 12/06/2022, 12:32 PM

## 2022-12-09 ENCOUNTER — Ambulatory Visit (INDEPENDENT_AMBULATORY_CARE_PROVIDER_SITE_OTHER): Payer: 59 | Admitting: Gastroenterology

## 2022-12-09 ENCOUNTER — Encounter: Payer: Self-pay | Admitting: Gastroenterology

## 2022-12-09 VITALS — BP 122/71 | HR 60 | Temp 98.6°F | Ht 59.0 in | Wt 154.4 lb

## 2022-12-09 DIAGNOSIS — R112 Nausea with vomiting, unspecified: Secondary | ICD-10-CM

## 2022-12-09 DIAGNOSIS — R197 Diarrhea, unspecified: Secondary | ICD-10-CM

## 2022-12-09 DIAGNOSIS — K219 Gastro-esophageal reflux disease without esophagitis: Secondary | ICD-10-CM

## 2022-12-09 NOTE — Patient Instructions (Addendum)
For now continue to hold Ozempic until talking with your primary care provider.  From my standpoint I would recommend that if you do resume it that you wait at least a couple more weeks.   Cannot rule out Ozempic as contributing to this episode however given it has resolved within a week it is more suspicious for a recent viral illness to cause her symptoms given you were also having nausea and vomiting.  If you want to stop taking some of the BRAT/bland foods to see how you do that is fine.  I would continue to avoid fried and spicy foods.  You can still keep the Zofran and Bentyl to use as needed for any abdominal pain and diarrhea and let us know if the symptoms return and persist.  Continue to hold off on any more Imodium for now.  If you do not have a bowel movement by Saturday I would recommend a half of his MiraLAX to ensure we do not start a constipation/diarrhea cycle.  It was a pleasure to see you today! I want to create trusting relationships with patients. If you receive a survey regarding your visit,  I greatly appreciate you taking time to fill this out on paper or through your MyChart. I value your feedback.  Brooke Bonito, MSN, FNP-BC, AGACNP-BC [facility]

## 2022-12-09 NOTE — Progress Notes (Signed)
GI Office Note    Referring Provider: Wilmon Pali, FNP Primary Care Physician:  Wilmon Pali, FNP Primary Gastroenterologist: Gerrit Friends.Rourk, MD   Date:  12/09/2022  ID:  Hurshel Party, DOB [id number], MRN [id number]   Chief Complaint   Chief Complaint  Patient presents with   Follow-up    Follow from ER on 7/26 for diarrhea and dehydration   History of Present Illness  Sabrina Beasley is a 62 y.o. female with a history of GERD, alternating constipation/diarrhea, anxiety and depression, HLD, HTN, and positive hep C antibody with negative PCR presenting today with complaint of diarrhea.  Ultrasound with elastography April 2022: -K PA 3.1 -Mild echotexture, mild nodular contour -Hepatic steatosis -Potential intraductal calculus and common hepatic duct, MRCP may be helpful for further evaluation -Postcholecystectomy state  Colonoscopy August 2022: -Three 5-8 mm polyps in the descending and ascending colon -10 mm polyp in the ascending colon removed with hot snare -Pathology revealed tubular adenomas without high-grade dysplasia, sessile serrated polyp, and hyperplastic polyp -Advised repeat in 5 years  CT A/P February 2023: -No acute abnormality -Hepatic steatosis -Low-lying cecum and pelvis -Small to moderate volume of colonic stool -Redundant sigmoid and descending colon -No biliary ductal dilation.  Last office visit 04/13/2022.  Patient had recent ED visit for chest pain and diarrhea.  Cardiac etiology ruled out for chest pain.  Pain was relieved with GI cocktail and had done well since the ED visit.  Appears she was taking Dexilant as well as omeprazole.  Denies any dysphagia.  Denies any melena or BRBPR.  Recent normal LFTs.  Advised to stop Dexilant and omeprazole and begin pantoprazole 40 mg once daily.  Start fiber supplement daily and then increase to twice daily after 3 weeks.  Work toward weight loss.  Recommended 10 pound weight loss in 3 months.  Advised to  drink coffee.  Advise office visit in 3 months.  Urgent care visit 7/26 for 5 days of watery diarrhea.  States she was constipated the weekend prior and she took a Dulcolax on Sunday, a dose of MiraLAX on Monday, and she woke up with diarrhea on Tuesday.  Diarrhea continued.  She denied any BRBPR, nausea, vomiting, fever, chills, dizziness.  She tried Imodium without relief.  Reportedly was hypotensive in triage with negative orthostatics.  Discussed if hypotension continued and diarrhea persisted and that she should hold her hydrochlorothiazide.  Advised to follow-up with PCP.  Labs and stool studies ordered.  Advised to eat a bland diet and try probiotics.  Patient presented to the ER 7/28 and was admitted for 24 hours.  She presented with nausea, vomiting, and diarrhea for few days.  She reports she took her Ozempic as normally scheduled but prior week and has some constipation so she took laxatives.  She was admitted given AKI due to suspected viral gastroenteritis and possible component of Ozempic contributing.  She was advised to hold hydrochlorothiazide and lisinopril until she had follow-up BMP to ensure creatinine returns to baseline.  Given Bentyl and Zofran for symptom management and advised to hold on Ozempic until seeing PCP. Labs on 7/28: C. difficile and GI pathogen panel negative.  Sodium low at 132, BUN elevated at 50.  Creatinine 2.57.  Normal LFTs.  WBC 12.9, normal hemoglobin.  Platelets elevated 474. Labs 7/29 with normal B12 and folate.  Normal ferritin.  Elevated iron saturation to 34%   Today:  Had watery stools Monday/ early Tuesday morning and has been  taking imodium up until yesterday at noon. Had about a full weeks worth of diarrhea. Was going an innumerable amount and was waking her up at night. No melena or brbpr. No mucous currently. Tested herself at home for COVDI and was negative. Pain hs improved significantly.   Has been tacking bentyl as needed (last dose yesterday)  took a dose of zofran this morning but has not needed it regularly. Has had some mild nausea form ozempic.   She states for sure the ozempic has contributed to constipation. Usually waits a whole wek before she takes anything.   GERD controlled and denies any dysphagia.   Current Outpatient Medications  Medication Sig Dispense Refill   albuterol (VENTOLIN HFA) 108 (90 Base) MCG/ACT inhaler Inhale 1-2 puffs into the lungs every 6 (six) hours as needed for wheezing or shortness of breath. 18 g 0   atenolol (TENORMIN) 25 MG tablet Take 25 mg by mouth daily.     butalbital-acetaminophen-caffeine (FIORICET, ESGIC) 50-325-40 MG tablet Take 1 tablet by mouth at bedtime.     Cholecalciferol (VITAMIN D3) 50 MCG (2000 UT) capsule Take 2,000 Units by mouth daily.     DEXILANT 60 MG capsule Take 1 capsule by mouth once daily 90 capsule 3   dicyclomine (BENTYL) 10 MG capsule Take 2 capsules (20 mg total) by mouth 4 (four) times daily -  before meals and at bedtime. 20 capsule 0   dicyclomine (BENTYL) 20 MG tablet Take 1 tablet (20 mg total) by mouth 2 (two) times daily as needed for spasms. 20 tablet 0   diphenoxylate-atropine (LOMOTIL) 2.5-0.025 MG tablet Take 1 tablet by mouth 4 (four) times daily as needed for diarrhea or loose stools. 10 tablet 0   DULoxetine (CYMBALTA) 30 MG capsule Take 30 mg by mouth in the morning. 30 mg + 60 mg=90 mg     DULoxetine (CYMBALTA) 60 MG capsule Take 60 mg by mouth in the morning. 60 mg + 30 mg=90 mg  1   fenofibrate (TRICOR) 145 MG tablet Take 145 mg by mouth at bedtime.     levocetirizine (XYZAL) 5 MG tablet Take 5 mg by mouth every evening.     Multiple Vitamin (MULTIVITAMIN WITH MINERALS) TABS tablet Take 1 tablet by mouth in the morning.     ondansetron (ZOFRAN) 4 MG tablet Take 1 tablet (4 mg total) by mouth daily as needed for nausea or vomiting. 30 tablet 1   ondansetron (ZOFRAN-ODT) 4 MG disintegrating tablet Take 4 mg by mouth every 6 (six) hours.      pantoprazole (PROTONIX) 40 MG tablet Take 1 tablet (40 mg total) by mouth daily. Take one tablet daily 30 minutes before breaksfast 90 tablet 3   QUEtiapine (SEROQUEL) 100 MG tablet Take 100 mg by mouth daily with supper.     Saccharomyces boulardii (PROBIOTIC) 250 MG CAPS Take 1 capsule by mouth daily.     simvastatin (ZOCOR) 40 MG tablet Take 40 mg by mouth at bedtime.     traZODone (DESYREL) 150 MG tablet Take 150 mg by mouth at bedtime.     hydrOXYzine (VISTARIL) 25 MG capsule Take 25 mg by mouth at bedtime. (Patient not taking: Reported on 12/05/2022)     No current facility-administered medications for this visit.    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)    Bulging of cervical intervertebral disc    C4-5 &  C6-7  Constipation    DDD (degenerative disc disease), cervical    stenosis   Depression    Family history of premature CAD    GERD (gastroesophageal reflux disease)    Gout    History of endometriosis    History of kidney stones     right side w associated n/v   History of ovarian cyst    History of rheumatic fever    Hx of hepatitis C dx 04-16-2009 liver bx   mild portal/ periportal fibrosis-- lower serum level detection positive antibody virus RNA   Hyperlipidemia    Hypertension    OA (osteoarthritis)    Osteoporosis    PONV (postoperative nausea and vomiting)    pt states scopalomine patches help   Pre-diabetes    Right ureteral stone     Past Surgical History:  Procedure Laterality Date   CARDIOVASCULAR STRESS TEST  06/19/2015  dr Purvis Sheffield   normal nuclear study, low risk w/ overall LVSF normal and wall motion normal , stress ef 57%   CARPAL TUNNEL RELEASE Right x2  last one   CARPAL TUNNEL RELEASE Left 02/23/2013   Procedure: CARPAL TUNNEL RELEASE;  Surgeon: Vickki Hearing, MD;  Location: [facility];  Service: Orthopedics;  Laterality: Left;   CHOLECYSTECTOMY  1999   COLONOSCOPY  01/2012    Dr. Allena Katz: tortous colon, multiple colon polyps, 2 cauterized in ascending colon, one transverse colon adenomatous, sigmoid polyps (6) hyperplastic.    COLONOSCOPY WITH PROPOFOL N/A 12/25/2020   Procedure: COLONOSCOPY WITH PROPOFOL;  Surgeon: Corbin Ade, MD;  Location: [facility];  Service: Endoscopy;  Laterality: N/A;  11:15am   CYSTOSCOPY W/ RETROGRADES Right 05/07/2016   Procedure: CYSTOSCOPY WITH RETROGRADE PYELOGRAM;  Surgeon: Jerilee Field, MD;  Location: [facility];  Service: Urology;  Laterality: Right;   DX LAPAROSCOPY/  D & C HYSTEROSCOPY  2001   ESOPHAGOGASTRODUODENOSCOPY  05/2012   Dr. Allena Katz: esophagitis, gastritis, no hpylori   LAPAROSCOPIC SALPINGO OOPHERECTOMY Right 08/24/2006   ORIF ANKLE FRACTURE Left    POLYPECTOMY  12/25/2020   Procedure: POLYPECTOMY;  Surgeon: Corbin Ade, MD;  Location: [facility];  Service: Endoscopy;;   RECTOCELE REPAIR N/A 01/13/2016   Procedure: POSTERIOR VAGINAL REPAIR (RECTOCELE);  Surgeon: Tilda Burrow, MD;  Location: [facility];  Service: Gynecology;  Laterality: N/A;   TOTAL ABDOMINAL HYSTERECTOMY Left 05/22/2002   w/  Left Salpingo Oopherectomy   TRANSTHORACIC ECHOCARDIOGRAM  06/19/2015   ef 60-65%/  trivial MR and PR   TUBAL LIGATION  1989    Family History  Problem Relation Age of Onset   Hypertension Mother    Hyperlipidemia Mother    Heart disease Father    Heart disease Brother        passed age 73 from heart valve complication of some sort   Colon polyps Brother    Colon polyps Brother        deceased age 52 from "unknown"   Colon cancer Neg Hx     Allergies as of 12/09/2022 - Review Complete 12/09/2022  Allergen Reaction Noted   Corticosteroids Other (See Comments) 12/24/2020   Nitrofurantoin monohyd macro Nausea And Vomiting 11/03/2010   Sulfa antibiotics Rash 06/12/2015    Social History   Socioeconomic History   Marital status: Divorced    Spouse name: Not on file   Number of  children: Not on file   Years of education: Not on file   Highest education level: Not on file  Occupational History   Not on file  Tobacco Use   Smoking status: Former    Current packs/day: 0.00    Average packs/day: 0.5 packs/day for 39.6 years (19.8 ttl pk-yrs)    Types: Cigarettes    Start date: 05/10/1978    Quit date: 12/14/2017    Years since quitting: 4.9    Passive exposure: Past   Smokeless tobacco: Never  Vaping Use   Vaping status: Never Used  Substance and Sexual Activity   Alcohol use: No    Alcohol/week: 0.0 standard drinks of alcohol   Drug use: No   Sexual activity: Not Currently    Partners: Male    Birth control/protection: Surgical  Other Topics Concern   Not on file  Social History Narrative   Not on file   Social Determinants of Health   Financial Resource Strain: Not on file  Food Insecurity: No Food Insecurity (12/05/2022)   Hunger Vital Sign    Worried About Running Out of Food in the Last Year: Never true    Ran Out of Food in the Last Year: Never true  Transportation Needs: No Transportation Needs (12/05/2022)   PRAPARE - Administrator, Civil Service (Medical): No    Lack of Transportation (Non-Medical): No  Physical Activity: Not on file  Stress: Not on file  Social Connections: Not on file     Review of Systems   Gen: Denies fever, chills, anorexia. Denies fatigue, weakness, weight loss.  CV: Denies chest pain, palpitations, syncope, peripheral edema, and claudication. Resp: Denies dyspnea at rest, cough, wheezing, coughing up blood, and pleurisy. GI: See HPI Derm: Denies rash, itching, dry skin Psych: Denies depression, anxiety, memory loss, confusion. No homicidal or suicidal ideation.  Heme: Denies bruising, bleeding, and enlarged lymph nodes.   Physical Exam   BP 122/71   Pulse 60   Temp 98.6 F (37 C)   Ht 4\' 11"  (1.499 m)   Wt 154 lb 6.4 oz (70 kg)   BMI 31.19 kg/m   General:   Alert and oriented. No distress  noted. Pleasant and cooperative.  Head:  Normocephalic and atraumatic. Eyes:  Conjuctiva clear without scleral icterus. Mouth:  Oral mucosa pink and moist. Good dentition. No lesions. Abdomen:  +BS, soft, non-tender and non-distended. No rebound or guarding. No HSM or masses noted. Rectal: deferred Msk:  Symmetrical without gross deformities. Normal posture. Extremities:  Without edema. Neurologic:  Alert and  oriented x4 Psych:  Alert and cooperative. Normal mood and affect.   Assessment  Sabrina Beasley is a 62 y.o. female with a history of GERD, alternating constipation/diarrhea, anxiety and depression, HLD, HTN, and positive hep C antibody with negative PCR presenting today with complaint of diarrhea.  GERD: Controlled with pantoprazole 40 mg once daily.  Denies any dysphagia.  Continues to have some mild nausea but no recurrence of vomiting.  Acute diarrhea, N/V: Usually has baseline constipation.  Previously with constipation and took 2 days of laxatives and then woke up with diarrhea the next day.  Diarrhea persisted for about a week.  She had an urgent care visit as well as an ED visit due to symptoms.  She was also experiencing some nausea and vomiting as well.  Labs revealed AKI secondary to dehydration she also had some mild hypotension.  Recently has been on Ozempic for the last 3 months and currently on 0.5 mg weekly.  Potential that this could be contributing to his symptoms however she initially  had constipation related to this therefore it is highly suspicious that her acute symptoms were secondary to viral gastroenteritis despite GI pathogen and C. difficile testing negative.  Symptoms have resolved as of Tuesday.  She has had no more bowel movements and stopped taking Imodium yesterday.  Abdominal pain is also resolved.  We discussed avoiding Ozempic right now and if she decides to go back on it and monitor for return of symptoms.  Advise she can continue Zofran as needed for mild  nausea related to GLP-1.  Advised that she could slowly increase her diet again but continue to avoid fried/fatty foods.   PLAN   Continue to hold Ozempic until after speaking with your primary care.  If you do decide to resume I would recommend waiting a couple of weeks.  If diarrhea returns after resuming Ozempic then we will recommend avoiding all GLP-1 agonists Continue Zofran as needed Continue pantoprazole 40 mg once daily Stop Imodium Slowly increase diet, continue to avoid fried/fatty foods Follow-up as needed Will follow-up with her PCP Monday for blood work to reassess kidney function.    Brooke Bonito, MSN, FNP-BC, AGACNP-BC [facility]

## 2022-12-14 ENCOUNTER — Other Ambulatory Visit: Payer: Self-pay

## 2022-12-14 ENCOUNTER — Encounter (HOSPITAL_COMMUNITY): Payer: Self-pay | Admitting: Emergency Medicine

## 2022-12-14 ENCOUNTER — Emergency Department (HOSPITAL_COMMUNITY)
Admission: EM | Admit: 2022-12-14 | Discharge: 2022-12-14 | Disposition: A | Discharge status: Home / Self Care | Payer: 59 | Source: Home / Self Care | Attending: Student | Admitting: Student

## 2022-12-14 DIAGNOSIS — R799 Abnormal finding of blood chemistry, unspecified: Secondary | ICD-10-CM | POA: Diagnosis present

## 2022-12-14 DIAGNOSIS — Z79899 Other long term (current) drug therapy: Secondary | ICD-10-CM | POA: Diagnosis not present

## 2022-12-14 DIAGNOSIS — Z87891 Personal history of nicotine dependence: Secondary | ICD-10-CM | POA: Diagnosis not present

## 2022-12-14 DIAGNOSIS — I10 Essential (primary) hypertension: Secondary | ICD-10-CM | POA: Insufficient documentation

## 2022-12-14 LAB — CBC WITH DIFFERENTIAL/PLATELET
Abs Immature Granulocytes: 0.03 10*3/uL (ref 0.00–0.07)
Basophils Absolute: 0.1 10*3/uL (ref 0.0–0.1)
Basophils Relative: 1 %
Eosinophils Absolute: 0.5 10*3/uL (ref 0.0–0.5)
Eosinophils Relative: 6 %
HCT: 31.1 % — ABNORMAL LOW (ref 36.0–46.0)
Hemoglobin: 10.1 g/dL — ABNORMAL LOW (ref 12.0–15.0)
Immature Granulocytes: 0 %
Lymphocytes Relative: 17 %
Lymphs Abs: 1.5 10*3/uL (ref 0.7–4.0)
MCH: 28.9 pg (ref 26.0–34.0)
MCHC: 32.5 g/dL (ref 30.0–36.0)
MCV: 88.9 fL (ref 80.0–100.0)
Monocytes Absolute: 0.5 10*3/uL (ref 0.1–1.0)
Monocytes Relative: 5 %
Neutro Abs: 6.1 10*3/uL (ref 1.7–7.7)
Neutrophils Relative %: 71 %
Platelets: 428 10*3/uL — ABNORMAL HIGH (ref 150–400)
RBC: 3.5 MIL/uL — ABNORMAL LOW (ref 3.87–5.11)
RDW: 13.4 % (ref 11.5–15.5)
WBC: 8.7 10*3/uL (ref 4.0–10.5)
nRBC: 0 % (ref 0.0–0.2)

## 2022-12-14 LAB — COMPREHENSIVE METABOLIC PANEL
ALT: 18 U/L (ref 0–44)
AST: 18 U/L (ref 15–41)
Albumin: 3.9 g/dL (ref 3.5–5.0)
Alkaline Phosphatase: 29 U/L — ABNORMAL LOW (ref 38–126)
Anion gap: 9 (ref 5–15)
BUN: 21 mg/dL — ABNORMAL HIGH (ref 6–20)
CO2: 25 mmol/L (ref 22–32)
Calcium: 10.1 mg/dL (ref 8.9–10.3)
Chloride: 102 mmol/L (ref 98–111)
Creatinine, Ser: 1.22 mg/dL — ABNORMAL HIGH (ref 0.44–1.00)
GFR, Estimated: 51 mL/min — ABNORMAL LOW (ref >60)
Glucose, Bld: 118 mg/dL — ABNORMAL HIGH (ref 70–99)
Potassium: 3.4 mmol/L — ABNORMAL LOW (ref 3.5–5.1)
Sodium: 136 mmol/L (ref 135–145)
Total Bilirubin: 0.4 mg/dL (ref 0.3–1.2)
Total Protein: 7 g/dL (ref 6.5–8.1)

## 2022-12-14 LAB — MAGNESIUM: Magnesium: 1 mg/dL — ABNORMAL LOW (ref 1.7–2.4)

## 2022-12-14 MED ORDER — MAGNESIUM OXIDE 400 MG PO TABS: 400.0000 mg | ORAL_TABLET | Freq: Two times a day (BID) | ORAL | 0 refills | Status: AC

## 2022-12-14 MED ORDER — MAGNESIUM OXIDE -MG SUPPLEMENT 400 (240 MG) MG PO TABS
800.0000 mg | ORAL_TABLET | Freq: Once | ORAL | Status: AC
  Administered 2022-12-14: 800 mg via ORAL
  Filled 2022-12-14: qty 2

## 2022-12-14 NOTE — ED Triage Notes (Signed)
Pt presents from [facility] for evaluation of low Magnesium and elevated Creatine.

## 2022-12-14 NOTE — ED Notes (Signed)
Introduced self to pt  Pt has no physical complains of pain Denies SOB, CP, HA  Pt stated that PCP sent to ED for abnormal labs  IV access established, labs drawn, attached to partial monitor Call bell on stretcher

## 2022-12-14 NOTE — ED Provider Notes (Signed)
[name] Provider Note  CSN: [id number] Arrival date & time: 12/14/22 1125  Chief Complaint(s) Abnormal Labs (Magnesium low, Creatine high labs done at [facility].)  HPI Sabrina Beasley is a 62 y.o. female with PMH HTN, HLD, obesity, anxiety, depression, GERD, recent hospitalization on 728 for AKI and hypomagnesemia in the setting of a diarrheal illness who presents emergency department due to concern for abnormal labs.  Patient was discharged successfully after 24 hours with electrolyte repletion and improvement of her kidney function.  She followed up outpatient with her nurse practitioner who obtained follow-up labs and found the patient's creatinine to be 1.3 and magnesium to be 1.0.  Despite these being improved from hospital discharge, she received a call to emergently present to the emergency department for evaluation and electrolyte repletion.  Patient's diarrhea has completely resolved and she has no symptoms in the emergency room today.  She denies chest pain, shortness of breath, abdominal pain, nausea, vomiting, neurologic deficits including numbness, tingling, weakness ataxia or any other systemic symptoms.  I did call the patient's nurse practitioner [name] to determine why patient was emergently sent to the emergency department for asymptomatic hypomagnesemia and it appears that the number being low at 1.0 was the primary reason for ER referral today.   Past Medical History 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)    Bulging of cervical intervertebral disc    C4-5 &  C6-7   Constipation    DDD (degenerative disc disease), cervical    stenosis   Depression    Family history of premature CAD    GERD (gastroesophageal reflux disease)    Gout    History of endometriosis    History of kidney stones     right side w associated n/v   History of ovarian cyst     History of rheumatic fever    Hx of hepatitis C dx 04-16-2009 liver bx   mild portal/ periportal fibrosis-- lower serum level detection positive antibody virus RNA   Hyperlipidemia    Hypertension    OA (osteoarthritis)    Osteoporosis    PONV (postoperative nausea and vomiting)    pt states scopalomine patches help   Pre-diabetes    Right ureteral stone    Patient Active Problem List   Diagnosis Date Noted   AKI (acute kidney injury) (HCC) 12/05/2022   Snoring 06/17/2022   Anxiety 05/01/2019   Hypertension 05/01/2019   GERD (gastroesophageal reflux disease) 06/19/2018   Constipation 06/19/2018   Cerebral aneurysm 02/07/2018   SAH (subarachnoid hemorrhage) (HCC) 08/22/2017   Thrombocytosis 02/23/2017   Hx of hepatitis C 07/01/2016   Rectocele 12/23/2015   Diabetes mellitus without complication (HCC) 09/03/2015   Chest pain 01/10/2014   Hyperlipidemia 01/10/2014   S/P carpal tunnel release 02/26/2013   CTS (carpal tunnel syndrome) 02/14/2013   Arm numbness 11/06/2010   Home Medication(s) Prior to Admission medications   Medication Sig Start Date End Date Taking? Authorizing Provider  magnesium oxide (MAG-OX) 400 MG tablet Take 1 tablet (400 mg total) by mouth 2 (two) times daily for 7 days. 12/14/22 12/21/22 Yes Kyria Bumgardner, MD  albuterol (VENTOLIN HFA) 108 (90 Base) MCG/ACT inhaler Inhale 1-2 puffs into the lungs every 6 (six) hours as needed for wheezing or shortness of breath. 11/08/21   Particia Nearing, PA-C  atenolol (TENORMIN) 25 MG tablet Take 25  mg by mouth daily. 11/13/22   [provider]  butalbital-acetaminophen-caffeine (FIORICET, ESGIC) 50-325-40 MG tablet Take 1 tablet by mouth at bedtime. 11/14/17   [provider]  Cholecalciferol (VITAMIN D3) 50 MCG (2000 UT) capsule Take 2,000 Units by mouth daily. 07/14/20   [provider]  dicyclomine (BENTYL) 10 MG capsule Take 2 capsules (20 mg total) by mouth 4 (four) times daily -  before  meals and at bedtime. 12/06/22   Sherryll Burger, Pratik D, DO  DULoxetine (CYMBALTA) 30 MG capsule Take 30 mg by mouth in the morning. 30 mg + 60 mg=90 mg 06/30/20   [provider]  DULoxetine (CYMBALTA) 60 MG capsule Take 60 mg by mouth in the morning. 60 mg + 30 mg=90 mg 06/08/17   [provider]  fenofibrate (TRICOR) 145 MG tablet Take 145 mg by mouth at bedtime. 10/16/20   [provider]  hydrOXYzine (VISTARIL) 25 MG capsule Take 25 mg by mouth at bedtime. Patient not taking: Reported on 12/05/2022    [provider]  levocetirizine (XYZAL) 5 MG tablet Take 5 mg by mouth every evening.    [provider]  Multiple Vitamin (MULTIVITAMIN WITH MINERALS) TABS tablet Take 1 tablet by mouth in the morning.    [provider]  ondansetron (ZOFRAN) 4 MG tablet Take 1 tablet (4 mg total) by mouth daily as needed for nausea or vomiting. 12/06/22 12/06/23  Sherryll Burger, Pratik D, DO  ondansetron (ZOFRAN-ODT) 4 MG disintegrating tablet Take 4 mg by mouth every 6 (six) hours. 10/06/22   [provider]  pantoprazole (PROTONIX) 40 MG tablet Take 1 tablet (40 mg total) by mouth daily. Take one tablet daily 30 minutes before breaksfast 04/14/22   Rourk, Gerrit Friends, MD  QUEtiapine (SEROQUEL) 100 MG tablet Take 100 mg by mouth daily with supper.    [provider]  Saccharomyces boulardii (PROBIOTIC) 250 MG CAPS Take 1 capsule by mouth daily. 09/08/22   [provider]  simvastatin (ZOCOR) 40 MG tablet Take 40 mg by mouth at bedtime.    [provider]  traZODone (DESYREL) 150 MG tablet Take 150 mg by mouth at bedtime.    [provider]                                                                                                                                    Past Surgical History Past Surgical History:  Procedure Laterality Date   CARDIOVASCULAR STRESS TEST  06/19/2015  dr Purvis Sheffield   normal nuclear study, low risk w/ overall LVSF  normal and wall motion normal , stress ef 57%   CARPAL TUNNEL RELEASE Right x2  last one   CARPAL TUNNEL RELEASE Left 02/23/2013   Procedure: CARPAL TUNNEL RELEASE;  Surgeon: Vickki Hearing, MD;  Location: [facility];  Service: Orthopedics;  Laterality: Left;   CHOLECYSTECTOMY  1999   COLONOSCOPY  01/2012  Dr. Allena Katz: tortous colon, multiple colon polyps, 2 cauterized in ascending colon, one transverse colon adenomatous, sigmoid polyps (6) hyperplastic.    COLONOSCOPY WITH PROPOFOL N/A 12/25/2020   Procedure: COLONOSCOPY WITH PROPOFOL;  Surgeon: Corbin Ade, MD;  Location: [facility];  Service: Endoscopy;  Laterality: N/A;  11:15am   CYSTOSCOPY W/ RETROGRADES Right 05/07/2016   Procedure: CYSTOSCOPY WITH RETROGRADE PYELOGRAM;  Surgeon: Jerilee Field, MD;  Location: [facility];  Service: Urology;  Laterality: Right;   DX LAPAROSCOPY/  D & C HYSTEROSCOPY  2001   ESOPHAGOGASTRODUODENOSCOPY  05/2012   Dr. Allena Katz: esophagitis, gastritis, no hpylori   LAPAROSCOPIC SALPINGO OOPHERECTOMY Right 08/24/2006   ORIF ANKLE FRACTURE Left    POLYPECTOMY  12/25/2020   Procedure: POLYPECTOMY;  Surgeon: Corbin Ade, MD;  Location: [facility];  Service: Endoscopy;;   RECTOCELE REPAIR N/A 01/13/2016   Procedure: POSTERIOR VAGINAL REPAIR (RECTOCELE);  Surgeon: Tilda Burrow, MD;  Location: [facility];  Service: Gynecology;  Laterality: N/A;   TOTAL ABDOMINAL HYSTERECTOMY Left 05/22/2002   w/  Left Salpingo Oopherectomy   TRANSTHORACIC ECHOCARDIOGRAM  06/19/2015   ef 60-65%/  trivial MR and PR   TUBAL LIGATION  1989   Family History Family History  Problem Relation Age of Onset   Hypertension Mother    Hyperlipidemia Mother    Heart disease Father    Heart disease Brother        passed age 3 from heart valve complication of some sort   Colon polyps Brother    Colon polyps Brother        deceased age 64 from "unknown"   Colon cancer Neg Hx     Social History Social  History   Tobacco Use   Smoking status: Former    Current packs/day: 0.00    Average packs/day: 0.5 packs/day for 39.6 years (19.8 ttl pk-yrs)    Types: Cigarettes    Start date: 05/10/1978    Quit date: 12/14/2017    Years since quitting: 5.0    Passive exposure: Past   Smokeless tobacco: Never  Vaping Use   Vaping status: Never Used  Substance Use Topics   Alcohol use: No    Alcohol/week: 0.0 standard drinks of alcohol   Drug use: No   Allergies Corticosteroids, Nitrofurantoin monohyd macro, and Sulfa antibiotics  Review of Systems Review of Systems  All other systems reviewed and are negative.   Physical Exam Vital Signs  I have reviewed the triage vital signs BP 128/72 (BP Location: Left Arm)   Pulse 63   Temp 98.3 F (36.8 C) (Oral)   Resp 16   Ht 4\' 11"  (1.499 m)   Wt 70.3 kg   SpO2 98%   BMI 31.31 kg/m   Physical Exam Vitals and nursing note reviewed.  Constitutional:      General: She is not in acute distress.    Appearance: She is well-developed.  HENT:     Head: Normocephalic and atraumatic.  Eyes:     Conjunctiva/sclera: Conjunctivae normal.  Cardiovascular:     Rate and Rhythm: Normal rate and regular rhythm.     Heart sounds: No murmur heard. Pulmonary:     Effort: Pulmonary effort is normal. No respiratory distress.     Breath sounds: Normal breath sounds.  Abdominal:     Palpations: Abdomen is soft.     Tenderness: There is no abdominal tenderness.  Musculoskeletal:        General: No swelling.  Cervical back: Neck supple.  Skin:    General: Skin is warm and dry.     Capillary Refill: Capillary refill takes less than 2 seconds.  Neurological:     Mental Status: She is alert.  Psychiatric:        Mood and Affect: Mood normal.     ED Results and Treatments Labs (all labs ordered are listed, but only abnormal results are displayed) Labs Reviewed  COMPREHENSIVE METABOLIC PANEL - Abnormal; Notable for the following components:       Result Value   Potassium 3.4 (*)    Glucose, Bld 118 (*)    BUN 21 (*)    Creatinine, Ser 1.22 (*)    Alkaline Phosphatase 29 (*)    GFR, Estimated 51 (*)    All other components within normal limits  CBC WITH DIFFERENTIAL/PLATELET - Abnormal; Notable for the following components:   RBC 3.50 (*)    Hemoglobin 10.1 (*)    HCT 31.1 (*)    Platelets 428 (*)    All other components within normal limits  MAGNESIUM - Abnormal; Notable for the following components:   Magnesium 1.0 (*)    All other components within normal limits                                                                                                                          Radiology No results found.  Pertinent labs & imaging results that were available during my care of the patient were reviewed by me and considered in my medical decision making (see MDM for details).  Medications Ordered in ED Medications  magnesium oxide (MAG-OX) tablet 800 mg (800 mg Oral Given 12/14/22 1257)                                                                                                                                     Procedures Procedures  (including critical care time)  Medical Decision Making / ED Course   This patient presents to the ED for concern of abnormal labs, this involves an extensive number of treatment options, and is a complaint that carries with it a high risk of complications and morbidity.  The differential diagnosis includes secretory losses from diarrhea, lack of oral magnesium intake, dehydration  MDM: Patient seen emergency room for evaluation of abnormal labs.  Physical exam is unremarkable.  Neurologic exam is unremarkable  with normal gait, no focal motor or sensory deficits.  Laboratory evaluation with a potassium of 3.4, magnesium 1.0, BUN 21, creatinine 1.22 and this is from hospital discharge and in the outpatient setting.  Hemoglobin 10.1.  Patient is completely asymptomatic here in the  emergency department and we will again no oral repletion of magnesium here in the emergency department of which she will continue twice daily the next 7 days.  I called the patient's primary care nurse practitioner to discuss follow-up plan.  I did offer the patient IV magnesium to put everyone's mind at ease but as she is completely asymptomatic she is requesting oral magnesium which is completely reasonable.  I also spoke with our hospitalist here in the emergency department to ensure that outpatient magnesium repletion with 400 mg twice daily for 7 days would be adequate and they do agree with this, as well as confirm that this can be handled in the outpatient setting appropriately.  She was given return precautions of which she voiced understanding she was discharged with outpatient PCP follow-up.   Additional history obtained: -Additional history obtained from husband -External records from outside source obtained and reviewed including: Chart review including previous notes, labs, imaging, consultation notes   Lab Tests: -I ordered, reviewed, and interpreted labs.   The pertinent results include:   Labs Reviewed  COMPREHENSIVE METABOLIC PANEL - Abnormal; Notable for the following components:      Result Value   Potassium 3.4 (*)    Glucose, Bld 118 (*)    BUN 21 (*)    Creatinine, Ser 1.22 (*)    Alkaline Phosphatase 29 (*)    GFR, Estimated 51 (*)    All other components within normal limits  CBC WITH DIFFERENTIAL/PLATELET - Abnormal; Notable for the following components:   RBC 3.50 (*)    Hemoglobin 10.1 (*)    HCT 31.1 (*)    Platelets 428 (*)    All other components within normal limits  MAGNESIUM - Abnormal; Notable for the following components:   Magnesium 1.0 (*)    All other components within normal limits     Medicines ordered and prescription drug management: Meds ordered this encounter  Medications   magnesium oxide (MAG-OX) tablet 800 mg   magnesium oxide  (MAG-OX) 400 MG tablet    Sig: Take 1 tablet (400 mg total) by mouth 2 (two) times daily for 7 days.    Dispense:  14 tablet    Refill:  0    -I have reviewed the patients home medicines and have made adjustments as needed  Critical interventions none   Cardiac Monitoring: The patient was maintained on a cardiac monitor.  I personally viewed and interpreted the cardiac monitored which showed an underlying rhythm of: NSR, no evidence of QT prolongation on monitor  Social Determinants of Health:  Factors impacting patients care include: none   Reevaluation: After the interventions noted above, I reevaluated the patient and found that they have :stayed the same  Co morbidities that complicate the patient evaluation  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)    Bulging of cervical intervertebral disc    C4-5 &  C6-7   Constipation    DDD (degenerative disc disease), cervical    stenosis   Depression    Family history of premature CAD    GERD (gastroesophageal reflux disease)    Gout    History of  endometriosis    History of kidney stones     right side w associated n/v   History of ovarian cyst    History of rheumatic fever    Hx of hepatitis C dx 04-16-2009 liver bx   mild portal/ periportal fibrosis-- lower serum level detection positive antibody virus RNA   Hyperlipidemia    Hypertension    OA (osteoarthritis)    Osteoporosis    PONV (postoperative nausea and vomiting)    pt states scopalomine patches help   Pre-diabetes    Right ureteral stone       Dispostion: I considered admission for this patient, but at this time she does not meet inpatient criteria for admission and she is safe for discharge with outpatient follow-up     Final Clinical Impression(s) / ED Diagnoses Final diagnoses:  Hypomagnesemia     @PCDICTATION @    Glendora Score, MD 12/14/22 2149

## 2022-12-23 ENCOUNTER — Encounter: Payer: 59 | Admitting: Internal Medicine

## 2022-12-23 ENCOUNTER — Encounter: Payer: Self-pay | Admitting: Internal Medicine

## 2022-12-23 NOTE — Progress Notes (Signed)
Erroneous encounter - please disregard.

## 2023-01-11 ENCOUNTER — Other Ambulatory Visit (HOSPITAL_COMMUNITY): Payer: Self-pay | Admitting: Nephrology

## 2023-01-11 DIAGNOSIS — N1832 Chronic kidney disease, stage 3b: Secondary | ICD-10-CM

## 2023-01-21 ENCOUNTER — Ambulatory Visit (HOSPITAL_COMMUNITY)
Admission: RE | Admit: 2023-01-21 | Discharge: 2023-01-21 | Disposition: A | Discharge status: Home / Self Care | Payer: 59 | Source: Ambulatory Visit | Attending: Nephrology | Admitting: Nephrology

## 2023-01-21 DIAGNOSIS — N1832 Chronic kidney disease, stage 3b: Secondary | ICD-10-CM | POA: Insufficient documentation

## 2023-02-11 ENCOUNTER — Other Ambulatory Visit (HOSPITAL_COMMUNITY): Payer: Self-pay | Admitting: Nurse Practitioner

## 2023-02-11 DIAGNOSIS — Z1231 Encounter for screening mammogram for malignant neoplasm of breast: Secondary | ICD-10-CM

## 2023-02-17 ENCOUNTER — Encounter (HOSPITAL_COMMUNITY): Payer: Self-pay

## 2023-02-17 ENCOUNTER — Ambulatory Visit (HOSPITAL_COMMUNITY)
Admission: RE | Admit: 2023-02-17 | Discharge: 2023-02-17 | Disposition: A | Discharge status: Home / Self Care | Payer: 59 | Source: Ambulatory Visit | Attending: Nurse Practitioner | Admitting: Nurse Practitioner

## 2023-02-17 DIAGNOSIS — Z1231 Encounter for screening mammogram for malignant neoplasm of breast: Secondary | ICD-10-CM | POA: Insufficient documentation

## 2023-04-10 ENCOUNTER — Other Ambulatory Visit: Payer: Self-pay

## 2023-04-10 ENCOUNTER — Encounter: Payer: Self-pay | Admitting: Emergency Medicine

## 2023-04-10 ENCOUNTER — Ambulatory Visit
Admission: EM | Admit: 2023-04-10 | Discharge: 2023-04-10 | Disposition: A | Discharge status: Home / Self Care | Payer: 59 | Attending: Family Medicine | Admitting: Family Medicine

## 2023-04-10 DIAGNOSIS — J069 Acute upper respiratory infection, unspecified: Secondary | ICD-10-CM

## 2023-04-10 DIAGNOSIS — H6992 Unspecified Eustachian tube disorder, left ear: Secondary | ICD-10-CM

## 2023-04-10 MED ORDER — PROMETHAZINE-DM 6.25-15 MG/5ML PO SYRP: 5.0000 mL | ORAL_SOLUTION | Freq: Four times a day (QID) | ORAL | 0 refills | Status: DC | PRN

## 2023-04-10 MED ORDER — FLUTICASONE PROPIONATE 50 MCG/ACT NA SUSP: 1.0000 | Freq: Two times a day (BID) | NASAL | 2 refills | Status: AC

## 2023-04-10 MED ORDER — METHYLPREDNISOLONE SODIUM SUCC 125 MG IJ SOLR
60.0000 mg | Freq: Once | INTRAMUSCULAR | Status: AC
  Administered 2023-04-10: 60 mg via INTRAMUSCULAR

## 2023-04-10 NOTE — ED Provider Notes (Signed)
[facility]    CSN: [id number] Arrival date & time: 04/10/23  9563      History   Chief Complaint Chief Complaint  Patient presents with   Cough    HPI Sabrina Beasley is a 62 y.o. female.   Presenting today with 4-day history of significant left ear pain, sore throat, productive cough, fatigue.  Denies fever, chills, chest pain, shortness of breath, abdominal pain, nausea vomiting or diarrhea.  Taking Mucinex, eardrops, Xyzal with minimal relief.  No known sick contacts recently.    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)    Bulging of cervical intervertebral disc    C4-5 &  C6-7   Constipation    DDD (degenerative disc disease), cervical    stenosis   Depression    Family history of premature CAD    GERD (gastroesophageal reflux disease)    Gout    History of endometriosis    History of kidney stones     right side w associated n/v   History of ovarian cyst    History of rheumatic fever    Hx of hepatitis C dx 04-16-2009 liver bx   mild portal/ periportal fibrosis-- lower serum level detection positive antibody virus RNA   Hyperlipidemia    Hypertension    OA (osteoarthritis)    Osteoporosis    PONV (postoperative nausea and vomiting)    pt states scopalomine patches help   Pre-diabetes    Right ureteral stone     Patient Active Problem List   Diagnosis Date Noted   ERRONEOUS ENCOUNTER--DISREGARD 12/23/2022   AKI (acute kidney injury) (HCC) 12/05/2022   Snoring 06/17/2022   Anxiety 05/01/2019   Hypertension 05/01/2019   GERD (gastroesophageal reflux disease) 06/19/2018   Constipation 06/19/2018   Cerebral aneurysm 02/07/2018   SAH (subarachnoid hemorrhage) (HCC) 08/22/2017   Thrombocytosis 02/23/2017   Hx of hepatitis C 07/01/2016   Rectocele 12/23/2015   Diabetes mellitus without complication (HCC) 09/03/2015   Chest pain 01/10/2014   Hyperlipidemia 01/10/2014   S/P carpal  tunnel release 02/26/2013   CTS (carpal tunnel syndrome) 02/14/2013   Arm numbness 11/06/2010    Past Surgical History:  Procedure Laterality Date   CARDIOVASCULAR STRESS TEST  06/19/2015  dr Purvis Sheffield   normal nuclear study, low risk w/ overall LVSF normal and wall motion normal , stress ef 57%   CARPAL TUNNEL RELEASE Right x2  last one   CARPAL TUNNEL RELEASE Left 02/23/2013   Procedure: CARPAL TUNNEL RELEASE;  Surgeon: Vickki Hearing, MD;  Location: [facility];  Service: Orthopedics;  Laterality: Left;   CHOLECYSTECTOMY  1999   COLONOSCOPY  01/2012   Dr. Allena Katz: tortous colon, multiple colon polyps, 2 cauterized in ascending colon, one transverse colon adenomatous, sigmoid polyps (6) hyperplastic.    COLONOSCOPY WITH PROPOFOL N/A 12/25/2020   Procedure: COLONOSCOPY WITH PROPOFOL;  Surgeon: Corbin Ade, MD;  Location: [facility];  Service: Endoscopy;  Laterality: N/A;  11:15am   CYSTOSCOPY W/ RETROGRADES Right 05/07/2016   Procedure: CYSTOSCOPY WITH RETROGRADE PYELOGRAM;  Surgeon: Jerilee Field, MD;  Location: [facility];  Service: Urology;  Laterality: Right;   DX LAPAROSCOPY/  D & C HYSTEROSCOPY  2001   ESOPHAGOGASTRODUODENOSCOPY  05/2012   Dr. Allena Katz: esophagitis, gastritis, no hpylori   LAPAROSCOPIC SALPINGO OOPHERECTOMY Right 08/24/2006   ORIF ANKLE FRACTURE Left    POLYPECTOMY  12/25/2020  Procedure: POLYPECTOMY;  Surgeon: Corbin Ade, MD;  Location: [facility];  Service: Endoscopy;;   RECTOCELE REPAIR N/A 01/13/2016   Procedure: POSTERIOR VAGINAL REPAIR (RECTOCELE);  Surgeon: Tilda Burrow, MD;  Location: [facility];  Service: Gynecology;  Laterality: N/A;   TOTAL ABDOMINAL HYSTERECTOMY Left 05/22/2002   w/  Left Salpingo Oopherectomy   TRANSTHORACIC ECHOCARDIOGRAM  06/19/2015   ef 60-65%/  trivial MR and PR   TUBAL LIGATION  1989    OB History     Gravida  3   Para  3   Term  3   Preterm      AB      Living         SAB       IAB      Ectopic      Multiple      Live Births               Home Medications    Prior to Admission medications   Medication Sig Start Date End Date Taking? Authorizing Provider  fluticasone (FLONASE) 50 MCG/ACT nasal spray Place 1 spray into both nostrils 2 (two) times daily. 04/10/23  Yes Particia Nearing, PA-C  promethazine-dextromethorphan (PROMETHAZINE-DM) 6.25-15 MG/5ML syrup Take 5 mLs by mouth 4 (four) times daily as needed. 04/10/23  Yes Particia Nearing, PA-C  albuterol (VENTOLIN HFA) 108 (90 Base) MCG/ACT inhaler Inhale 1-2 puffs into the lungs every 6 (six) hours as needed for wheezing or shortness of breath. 11/08/21   Particia Nearing, PA-C  atenolol (TENORMIN) 25 MG tablet Take 25 mg by mouth daily. 11/13/22   [provider]  butalbital-acetaminophen-caffeine (FIORICET, ESGIC) 50-325-40 MG tablet Take 1 tablet by mouth at bedtime. 11/14/17   [provider]  Cholecalciferol (VITAMIN D3) 50 MCG (2000 UT) capsule Take 2,000 Units by mouth daily. 07/14/20   [provider]  dicyclomine (BENTYL) 10 MG capsule Take 2 capsules (20 mg total) by mouth 4 (four) times daily -  before meals and at bedtime. 12/06/22   Sherryll Burger, Pratik D, DO  DULoxetine (CYMBALTA) 30 MG capsule Take 30 mg by mouth in the morning. 30 mg + 60 mg=90 mg 06/30/20   [provider]  DULoxetine (CYMBALTA) 60 MG capsule Take 60 mg by mouth in the morning. 60 mg + 30 mg=90 mg 06/08/17   [provider]  fenofibrate (TRICOR) 145 MG tablet Take 145 mg by mouth at bedtime. 10/16/20   [provider]  hydrOXYzine (VISTARIL) 25 MG capsule Take 25 mg by mouth at bedtime. Patient not taking: Reported on 12/05/2022    [provider]  levocetirizine (XYZAL) 5 MG tablet Take 5 mg by mouth every evening.    [provider]  Multiple Vitamin (MULTIVITAMIN WITH MINERALS) TABS tablet Take 1 tablet by mouth in the morning.    [provider]   ondansetron (ZOFRAN) 4 MG tablet Take 1 tablet (4 mg total) by mouth daily as needed for nausea or vomiting. 12/06/22 12/06/23  Sherryll Burger, Pratik D, DO  ondansetron (ZOFRAN-ODT) 4 MG disintegrating tablet Take 4 mg by mouth every 6 (six) hours. 10/06/22   [provider]  pantoprazole (PROTONIX) 40 MG tablet Take 1 tablet (40 mg total) by mouth daily. Take one tablet daily 30 minutes before breaksfast 04/14/22   Rourk, Gerrit Friends, MD  QUEtiapine (SEROQUEL) 100 MG tablet Take 100 mg by mouth daily with supper.    [provider]  Saccharomyces boulardii (  PROBIOTIC) 250 MG CAPS Take 1 capsule by mouth daily. 09/08/22   [provider]  simvastatin (ZOCOR) 40 MG tablet Take 40 mg by mouth at bedtime.    [provider]  traZODone (DESYREL) 150 MG tablet Take 150 mg by mouth at bedtime.    [provider]    Family History Family History  Problem Relation Age of Onset   Hypertension Mother    Hyperlipidemia Mother    Heart disease Father    Heart disease Brother        passed age 66 from heart valve complication of some sort   Colon polyps Brother    Colon polyps Brother        deceased age 51 from "unknown"   Colon cancer Neg Hx     Social History Social History   Tobacco Use   Smoking status: Former    Current packs/day: 0.00    Average packs/day: 0.5 packs/day for 39.6 years (19.8 ttl pk-yrs)    Types: Cigarettes    Start date: 05/10/1978    Quit date: 12/14/2017    Years since quitting: 5.3    Passive exposure: Past   Smokeless tobacco: Never  Vaping Use   Vaping status: Never Used  Substance Use Topics   Alcohol use: No    Alcohol/week: 0.0 standard drinks of alcohol   Drug use: No     Allergies   Corticosteroids, Nitrofurantoin monohyd macro, Prednisone, and Sulfa antibiotics   Review of Systems Review of Systems Per HPI  Physical Exam Triage Vital Signs ED Triage Vitals  Encounter Vitals Group     BP 04/10/23 0852 139/79      Systolic BP Percentile --      Diastolic BP Percentile --      Pulse Rate 04/10/23 0852 90     Resp 04/10/23 0852 20     Temp 04/10/23 0852 98.2 F (36.8 C)     Temp Source 04/10/23 0852 Oral     SpO2 04/10/23 0852 94 %     Weight --      Height --      Head Circumference --      Peak Flow --      Pain Score 04/10/23 0853 8     Pain Loc --      Pain Education --      Exclude from Growth Chart --    No data found.  Updated Vital Signs BP 139/79 (BP Location: Right Arm)   Pulse 90   Temp 98.2 F (36.8 C) (Oral)   Resp 20   SpO2 94%   Visual Acuity Right Eye Distance:   Left Eye Distance:   Bilateral Distance:    Right Eye Near:   Left Eye Near:    Bilateral Near:     Physical Exam Vitals and nursing note reviewed.  Constitutional:      Appearance: Normal appearance.  HENT:     Head: Atraumatic.     Right Ear: Tympanic membrane and external ear normal.     Left Ear: External ear normal.     Ears:     Comments: Left middle ear effusion    Nose: Rhinorrhea present.     Mouth/Throat:     Mouth: Mucous membranes are moist.     Pharynx: Posterior oropharyngeal erythema present.  Eyes:     Extraocular Movements: Extraocular movements intact.     Conjunctiva/sclera: Conjunctivae normal.  Cardiovascular:     Rate and Rhythm:  Normal rate and regular rhythm.     Heart sounds: Normal heart sounds.  Pulmonary:     Effort: Pulmonary effort is normal.     Breath sounds: Normal breath sounds. No wheezing or rales.  Musculoskeletal:        General: Normal range of motion.     Cervical back: Normal range of motion and neck supple.  Skin:    General: Skin is warm and dry.  Neurological:     Mental Status: She is alert and oriented to person, place, and time.  Psychiatric:        Mood and Affect: Mood normal.        Thought Content: Thought content normal.      UC Treatments / Results  Labs (all labs ordered are listed, but only abnormal results are  displayed) Labs Reviewed - No data to display  EKG   Radiology No results found.  Procedures Procedures (including critical care time)  Medications Ordered in UC Medications  methylPREDNISolone sodium succinate (SOLU-MEDROL) 125 mg/2 mL injection 60 mg (60 mg Intramuscular Given 04/10/23 0914)    Initial Impression / Assessment and Plan / UC Course  I have reviewed the triage vital signs and the nursing notes.  Pertinent labs & imaging results that were available during my care of the patient were reviewed by me and considered in my medical decision making (see chart for details).     Vital signs reassuring today, exam consistent with viral respiratory infection causing a left middle ear effusion.  Treat with IM Solu-Medrol which patient states she tolerates though she does not tolerate oral prednisone due to mood changes.  Flonase, decongestants, over-the-counter pain relievers.  Return for worsening symptoms.  Final Clinical Impressions(s) / UC Diagnoses   Final diagnoses:  Viral URI with cough  Acute dysfunction of left eustachian tube   Discharge Instructions   None    ED Prescriptions     Medication Sig Dispense Auth. Provider   fluticasone (FLONASE) 50 MCG/ACT nasal spray Place 1 spray into both nostrils 2 (two) times daily. 16 g Roosvelt Maser [place], [place]   promethazine-dextromethorphan (PROMETHAZINE-DM) 6.25-15 MG/5ML syrup Take 5 mLs by mouth 4 (four) times daily as needed. 100 mL Particia Nearing, [place]      PDMP not reviewed this encounter.   Roosvelt Maser [place], [place] 04/10/23 [phone]

## 2023-04-10 NOTE — ED Triage Notes (Signed)
Pt reports left ear pain, sore throat, cough since Thursday. Has been using otc medication, ear drops with no change in symptoms.

## 2023-05-05 NOTE — Patient Instructions (Signed)

## 2023-05-05 NOTE — Progress Notes (Unsigned)
New Patient Office Visit   Subjective   Patient ID: Sabrina Beasley, female    DOB: Jan 30, 1961  Age: 62 y.o. MRN: [id number]  CC: No chief complaint on file.   HPI Sabrina Beasley 62 year old female, presents to establish care. She  has a past medical history of Anxiety, Bleeding in brain due to brain aneurysm (HCC), Bulging of cervical intervertebral disc, Constipation, DDD (degenerative disc disease), cervical, Depression, Family history of premature CAD, GERD (gastroesophageal reflux disease), Gout, History of endometriosis, History of kidney stones, History of ovarian cyst, History of rheumatic fever, hepatitis C (dx 04-16-2009 liver bx), Hyperlipidemia, Hypertension, OA (osteoarthritis), Osteoporosis, PONV (postoperative nausea and vomiting), Pre-diabetes, and Right ureteral stone.  Migraine       Outpatient Encounter Medications as of 05/06/2023  Medication Sig   albuterol (VENTOLIN HFA) 108 (90 Base) MCG/ACT inhaler Inhale 1-2 puffs into the lungs every 6 (six) hours as needed for wheezing or shortness of breath.   atenolol (TENORMIN) 25 MG tablet Take 25 mg by mouth daily.   butalbital-acetaminophen-caffeine (FIORICET, ESGIC) 50-325-40 MG tablet Take 1 tablet by mouth at bedtime.   Cholecalciferol (VITAMIN D3) 50 MCG (2000 UT) capsule Take 2,000 Units by mouth daily.   dicyclomine (BENTYL) 10 MG capsule Take 2 capsules (20 mg total) by mouth 4 (four) times daily -  before meals and at bedtime.   DULoxetine (CYMBALTA) 30 MG capsule Take 30 mg by mouth in the morning. 30 mg + 60 mg=90 mg   DULoxetine (CYMBALTA) 60 MG capsule Take 60 mg by mouth in the morning. 60 mg + 30 mg=90 mg   fenofibrate (TRICOR) 145 MG tablet Take 145 mg by mouth at bedtime.   fluticasone (FLONASE) 50 MCG/ACT nasal spray Place 1 spray into both nostrils 2 (two) times daily.   hydrOXYzine (VISTARIL) 25 MG capsule Take 25 mg by mouth at bedtime. (Patient not taking: Reported on 12/05/2022)   levocetirizine  (XYZAL) 5 MG tablet Take 5 mg by mouth every evening.   Multiple Vitamin (MULTIVITAMIN WITH MINERALS) TABS tablet Take 1 tablet by mouth in the morning.   ondansetron (ZOFRAN) 4 MG tablet Take 1 tablet (4 mg total) by mouth daily as needed for nausea or vomiting.   ondansetron (ZOFRAN-ODT) 4 MG disintegrating tablet Take 4 mg by mouth every 6 (six) hours.   pantoprazole (PROTONIX) 40 MG tablet Take 1 tablet (40 mg total) by mouth daily. Take one tablet daily 30 minutes before breaksfast   promethazine-dextromethorphan (PROMETHAZINE-DM) 6.25-15 MG/5ML syrup Take 5 mLs by mouth 4 (four) times daily as needed.   QUEtiapine (SEROQUEL) 100 MG tablet Take 100 mg by mouth daily with supper.   Saccharomyces boulardii (PROBIOTIC) 250 MG CAPS Take 1 capsule by mouth daily.   simvastatin (ZOCOR) 40 MG tablet Take 40 mg by mouth at bedtime.   traZODone (DESYREL) 150 MG tablet Take 150 mg by mouth at bedtime.   No facility-administered encounter medications on file as of 05/06/2023.    Past Surgical History:  Procedure Laterality Date   CARDIOVASCULAR STRESS TEST  06/19/2015  dr Purvis Sheffield   normal nuclear study, low risk w/ overall LVSF normal and wall motion normal , stress ef 57%   CARPAL TUNNEL RELEASE Right x2  last one   CARPAL TUNNEL RELEASE Left 02/23/2013   Procedure: CARPAL TUNNEL RELEASE;  Surgeon: Vickki Hearing, MD;  Location: [facility];  Service: Orthopedics;  Laterality: Left;   CHOLECYSTECTOMY  1999   COLONOSCOPY  01/2012   Dr. Allena Katz: tortous colon, multiple colon polyps, 2 cauterized in ascending colon, one transverse colon adenomatous, sigmoid polyps (6) hyperplastic.    COLONOSCOPY WITH PROPOFOL N/A 12/25/2020   Procedure: COLONOSCOPY WITH PROPOFOL;  Surgeon: Corbin Ade, MD;  Location: [facility];  Service: Endoscopy;  Laterality: N/A;  11:15am   CYSTOSCOPY W/ RETROGRADES Right 05/07/2016   Procedure: CYSTOSCOPY WITH RETROGRADE PYELOGRAM;  Surgeon: Jerilee Field, MD;   [facility];  Service: Urology;  Laterality: Right;   DX LAPAROSCOPY/  D & C HYSTEROSCOPY  2001   ESOPHAGOGASTRODUODENOSCOPY  05/2012   Dr. Allena Katz: esophagitis, gastritis, no hpylori   LAPAROSCOPIC SALPINGO OOPHERECTOMY Right 08/24/2006   ORIF ANKLE FRACTURE Left    POLYPECTOMY  12/25/2020   Procedure: POLYPECTOMY;  Surgeon: Corbin Ade, MD;  [facility];  Service: Endoscopy;;   RECTOCELE REPAIR N/A 01/13/2016   Procedure: POSTERIOR VAGINAL REPAIR (RECTOCELE);  Surgeon: Tilda Burrow, MD;  Location: [facility];  Service: Gynecology;  Laterality: N/A;   TOTAL ABDOMINAL HYSTERECTOMY Left 05/22/2002   w/  Left Salpingo Oopherectomy   TRANSTHORACIC ECHOCARDIOGRAM  06/19/2015   ef 60-65%/  trivial MR and PR   TUBAL LIGATION  1989    ROS    Objective    There were no vitals taken for this visit.  Physical Exam    Assessment & Plan:  There are no diagnoses linked to this encounter.  No follow-ups on file.   Sabrina Lederer Newman Nip, FNP

## 2023-05-06 ENCOUNTER — Ambulatory Visit: Payer: 59 | Admitting: Family Medicine

## 2023-05-06 DIAGNOSIS — R7301 Impaired fasting glucose: Secondary | ICD-10-CM

## 2023-05-06 DIAGNOSIS — I10 Essential (primary) hypertension: Secondary | ICD-10-CM

## 2023-05-06 DIAGNOSIS — E559 Vitamin D deficiency, unspecified: Secondary | ICD-10-CM

## 2023-05-06 DIAGNOSIS — E038 Other specified hypothyroidism: Secondary | ICD-10-CM

## 2023-05-06 DIAGNOSIS — D509 Iron deficiency anemia, unspecified: Secondary | ICD-10-CM

## 2023-05-17 ENCOUNTER — Encounter: Payer: Self-pay | Admitting: Family Medicine

## 2023-05-17 ENCOUNTER — Encounter: Payer: Self-pay | Admitting: Orthopedic Surgery

## 2023-05-17 ENCOUNTER — Other Ambulatory Visit (INDEPENDENT_AMBULATORY_CARE_PROVIDER_SITE_OTHER): Payer: Self-pay

## 2023-05-17 ENCOUNTER — Ambulatory Visit: Payer: 59 | Admitting: Orthopedic Surgery

## 2023-05-17 VITALS — BP 150/83 | HR 66 | Ht 59.0 in | Wt 160.0 lb

## 2023-05-17 DIAGNOSIS — M25512 Pain in left shoulder: Secondary | ICD-10-CM

## 2023-05-17 DIAGNOSIS — M7582 Other shoulder lesions, left shoulder: Secondary | ICD-10-CM | POA: Diagnosis not present

## 2023-05-17 MED ORDER — CYCLOBENZAPRINE HCL 10 MG PO TABS
10.0000 mg | ORAL_TABLET | Freq: Two times a day (BID) | ORAL | 0 refills | Status: AC | PRN
Start: 2023-05-17 — End: ?

## 2023-05-17 NOTE — Patient Instructions (Signed)
 Instructions Following Joint Injections  In clinic today, you received an injection in one of your joints (sometimes more than one).  Occasionally, you can have some pain at the injection site, this is normal.  You can place ice at the injection site, or take over-the-counter medications such as Tylenol (acetaminophen) or Advil (ibuprofen).  Please follow all directions listed on the bottle.  If your joint (knee or shoulder) becomes swollen, red or very painful, please contact the clinic for additional assistance.   Two medications were injected, including lidocaine and a steroid (often referred to as cortisone).  Lidocaine is effective almost immediately but wears off quickly.  However, the steroid can take a few days to improve your symptoms.  In some cases, it can make your pain worse for a couple of days.  Do not be concerned if this happens as it is common.  You can apply ice or take some over-the-counter medications as needed.   Injections in the same joint cannot be repeated for 3 months.  This helps to limit the risk of an infection in the joint.  If you were to develop an infection in your joint, the best treatment option would be surgery.    Rotator Cuff Tear/Tendinitis Rehab   Ask your health care provider which exercises are safe for you. Do exercises exactly as told by your health care provider and adjust them as directed. It is normal to feel mild stretching, pulling, tightness, or discomfort as you do these exercises. Stop right away if you feel sudden pain or your pain gets worse. Do not begin these exercises until told by your health care provider. Stretching and range-of-motion exercises  These exercises warm up your muscles and joints and improve the movement and flexibility of your shoulder. These exercises also help to relieve pain.  Shoulder pendulum In this exercise, you let the injured arm dangle toward the floor and then swing it like a clock pendulum. Stand near a table  or counter that you can hold onto for balance. Bend forward at the waist and let your left / right arm hang straight down. Use your other arm to support you and help you stay balanced. Relax your left / right arm and shoulder muscles, and move your hips and your trunk so your left / right arm swings freely. Your arm should swing because of the motion of your body, not because you are using your arm or shoulder muscles. Keep moving your hips and trunk so your arm swings in the following directions, as told by your health care provider: Side to side. Forward and backward. In clockwise and counterclockwise circles. Slowly return to the starting position. Repeat 10 times, or for 10 seconds per direction. Complete this exercise 2-3 times a day.      Shoulder flexion, seated This exercise is sometimes called table slides. In this exercise, you raise your arm in front of your body until you feel a stretch in your injured shoulder. Sit in a stable chair so your left / right forearm can rest on a flat surface. Your elbow should rest at a height that keeps your upper arm next to your body. Keeping your left / right shoulder relaxed, lean forward at the waist and let your hand slide forward (flexion). Stop when you feel a stretch in your shoulder, or when you reach the angle that is recommended by your health care provider. Hold for 5 seconds. Slowly return to the starting position. Repeat 10 times. Complete this  exercise 1-2  times a day.       Shoulder flexion, standing In this exercise, you raise your arm in front of your body (flexion) until you feel a stretch in your injured shoulder. Stand and hold a broomstick, a cane, or a similar object. Place your hands a little more than shoulder-width apart on the object. Your left / right hand should be palm-up, and your other hand should be palm-down. Keep your elbow straight and your shoulder muscles relaxed. Push the stick up with your healthy arm to raise  your left / right arm in front of your body, and then over your head until you feel a stretch in your shoulder. Avoid shrugging your shoulder while you raise your arm. Keep your shoulder blade tucked down toward the middle of your back. Keep your left / right shoulder muscles relaxed. Hold for 10 seconds. Slowly return to the starting position. Repeat 10 times. Complete this exercise 1-2 times a day.      Shoulder abduction, active-assisted You will need a stick, broom handle, or similar object to help you (assist) in doing this exercise. Lie on your back. This is the supine position. Hold a broomstick, a cane, or a similar object. Place your hands a little more than shoulder-width apart on the object. Your left / right hand should be palm-up, and your other hand should be palm-down. Keeping your shoulder relaxed, push the stick to raise your left / right arm out to your side (abduction) and then over your head. Use your other hand to help move the stick. Stop when you feel a stretch in your shoulder, or when you reach the angle that is recommended by your health care provider. Avoid shrugging your shoulder while you raise your arm. Keep your shoulder blade tucked down toward the middle of your back. Hold for 10 seconds. Slowly return to the starting position. Repeat 10 times. Complete this exercise 1-2 times a day.      Shoulder flexion, active-assisted Lie on your back. You may bend your knees for comfort. Hold a broomstick, a cane, or a similar object so that your hands are about shoulder-width apart. Your palms should face toward your feet. Raise your left / right arm over your head, then behind your head toward the floor (flexion). Use your other hand to help you do this (active-assisted). Stop when you feel a gentle stretch in your shoulder, or when you reach the angle that is recommended by your health care provider. Hold for 10 seconds. Use the stick and your other arm to help you  return your left / right arm to the starting position. Repeat 10 times. Complete this exercise 1-2 times a day.      External rotation Sit in a stable chair without armrests, or stand up. Tuck a soft object, such as a folded towel or a small ball, under your left / right upper arm. Hold a broomstick, a cane, or a similar object with your palms face-down, toward the floor. Bend your elbows to a 90-degree angle (right angle), and keep your hands about shoulder-width apart. Straighten your healthy arm and push the stick across your body, toward your left / right side. Keep your left / right arm bent. This will rotate your left / right forearm away from your body (external rotation). Hold for 10 seconds. Slowly return to the starting position. Repeat 10 times. Complete this exercise 1-2 times a day.        Strengthening exercises  These exercises build strength and endurance in your shoulder. Endurance is the ability to use your muscles for a long time, even after they get tired. Do not start doing these exercises until your health care provider approves. Shoulder flexion, isometric Stand or sit in a doorway, facing the door frame. Keep your left / right arm straight and make a gentle fist with your hand. Place your fist against the door frame. Only your fist should be touching the frame. Keep your upper arm at your side. Gently press your fist against the door frame, as if you are trying to raise your arm above your head (isometric shoulder flexion). Avoid shrugging your shoulder while you press your hand into the door frame. Keep your shoulder blade tucked down toward the middle of your back. Hold for 10 seconds. Slowly release the tension, and relax your muscles completely before you repeat the exercise. Repeat 10 times. Complete this exercise 3 times per week.      Shoulder abduction, isometric Stand or sit in a doorway. Your left / right arm should be closest to the door frame. Keep your  left / right arm straight, and place the back of your hand against the door frame. Only your hand should be touching the frame. Keep the rest of your arm close to your side. Gently press the back of your hand against the door frame, as if you are trying to raise your arm out to the side (isometric shoulder abduction). Avoid shrugging your shoulder while you press your hand into the door frame. Keep your shoulder blade tucked down toward the middle of your back. Hold for 10 seconds. Slowly release the tension, and relax your muscles completely before you repeat the exercise. Repeat 10 times. Complete this exercise 3 times per week.      Internal rotation, isometric This is an exercise in which you press your palm against a door frame without moving your shoulder joint (isometric). Stand or sit in a doorway, facing the door frame. Bend your left / right elbow, and place the palm of your hand against the door frame. Only your palm should be touching the frame. Keep your upper arm at your side. Gently press your hand against the door frame, as if you are trying to push your arm toward your abdomen (internal rotation). Gradually increase the pressure until you are pressing as hard as you can. Stop increasing the pressure if you feel shoulder pain. Avoid shrugging your shoulder while you press your hand into the door frame. Keep your shoulder blade tucked down toward the middle of your back. Hold for 10 seconds. Slowly release the tension, and relax your muscles completely before you repeat the exercise. Repeat 10 times. Complete this exercise 3 times per week.      External rotation, isometric This is an exercise in which you press the back of your wrist against a door frame without moving your shoulder joint (isometric). Stand or sit in a doorway, facing the door frame. Bend your left / right elbow and place the back of your wrist against the door frame. Only the back of your wrist should be  touching the frame. Keep your upper arm at your side. Gently press your wrist against the door frame, as if you are trying to push your arm away from your abdomen (external rotation). Gradually increase the pressure until you are pressing as hard as you can. Stop increasing the pressure if you feel pain. Avoid shrugging your shoulder while  you press your wrist into the door frame. Keep your shoulder blade tucked down toward the middle of your back. Hold for 10 seconds. Slowly release the tension, and relax your muscles completely before you repeat the exercise. Repeat 10 times. Complete this exercise 3 times per week.       Scapular retraction Sit in a stable chair without armrests, or stand up. Secure an exercise band to a stable object in front of you so the band is at shoulder height. Hold one end of the exercise band in each hand. Your palms should face down. Squeeze your shoulder blades together (retraction) and move your elbows slightly behind you. Do not shrug your shoulders upward while you do this. Hold for 10 seconds. Slowly return to the starting position. Repeat 10 times. Complete this exercise 3 times per week.      Shoulder extension Sit in a stable chair without armrests, or stand up. Secure an exercise band to a stable object in front of you so the band is above shoulder height. Hold one end of the exercise band in each hand. Straighten your elbows and lift your hands up to shoulder height. Squeeze your shoulder blades together and pull your hands down to the sides of your thighs (extension). Stop when your hands are straight down by your sides. Do not let your hands go behind your body. Hold for 10 seconds. Slowly return to the starting position. Repeat 10 times. Complete this exercise 3 times per week.       Scapular protraction, supine Lie on your back on a firm surface (supine position). Hold a 5 lbs (or soup can) weight in your left / right hand. Raise your left  / right arm straight into the air so your hand is directly above your shoulder joint. Push the weight into the air so your shoulder (scapula) lifts off the surface that you are lying on. The scapula will push up or forward (protraction). Do not move your head, neck, or back. Hold for 10 seconds. Slowly return to the starting position. Let your muscles relax completely before you repeat this exercise.  Repeat 10 times. Complete this exercise 3 times per week.

## 2023-05-17 NOTE — Progress Notes (Signed)
 New Patient Visit  Assessment: Sabrina Beasley is a 63 y.o. female with the following: 1. Rotator cuff tendonitis, left  Plan: NATESHA HASSEY has pain in the left shoulder.  Atraumatic onset.  She has good good range of motion and good strength on physical exam.  Strength testing of the infraspinatus, supraspinatus recreates her pain.  She does have some pain radiating from her neck.  Low concern for a significant tear of the rotator cuff at this time, but it is clear that the tendons are irritated.  Have recommended steroid injection.  She elects to proceed.  Have also sent in some Flexeril  to her pharmacy.  She will return to clinic as needed.  Procedure note injection Left shoulder    Verbal consent was obtained to inject the left shoulder, subacromial space Timeout was completed to confirm the site of injection.  The skin was prepped with alcohol and ethyl chloride was sprayed at the injection site.  A 21-gauge needle was used to inject 40 mg of Depo-Medrol  and 1% lidocaine  (4 cc) into the subacromial space of the left shoulder using a posterolateral approach.  There were no complications. A sterile bandage was applied.   Follow-up: Return if symptoms worsen or fail to improve.  Subjective:  Chief Complaint  Patient presents with   Shoulder Pain    L shoulder    History of Present Illness: Sabrina Beasley is a 63 y.o. female who presents for evaluation of left shoulder pain.  She is right-hand dominant.  She reports pain in the left shoulder for about a month.  No specific injury.  No fall.  No prior history of an injury to the left shoulder.  She has been taking ibuprofen, 800 mg every 4-5 hours.  This helps with some of the pain, but has not caused some resolution.  She has not worked with therapy.  No injection the left shoulder.  She does have a history of right shoulder pain, which responded to injections.  Also has some pain into the left side of her neck, and radiating onto the  left flank.   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)    Bulging of cervical intervertebral disc    C4-5 &  C6-7   Constipation    DDD (degenerative disc disease), cervical    stenosis   Depression    Family history of premature CAD    GERD (gastroesophageal reflux disease)    Gout    History of endometriosis    History of kidney stones     right side w associated n/v   History of ovarian cyst    History of rheumatic fever    Hx of hepatitis C dx 04-16-2009 liver bx   mild portal/ periportal fibrosis-- lower serum level detection positive antibody virus RNA   Hyperlipidemia    Hypertension    OA (osteoarthritis)    Osteoporosis    PONV (postoperative nausea and vomiting)    pt states scopalomine patches help   Pre-diabetes    Right ureteral stone     Past Surgical History:  Procedure Laterality Date   CARDIOVASCULAR STRESS TEST  06/19/2015  dr charls   normal nuclear study, low risk w/ overall  LVSF normal and wall motion normal , stress ef 57%   CARPAL TUNNEL RELEASE Right x2  last one   CARPAL TUNNEL RELEASE Left 02/23/2013   Procedure: CARPAL TUNNEL RELEASE;  Surgeon: Taft FORBES Minerva, MD;  Location: [facility];  Service: Orthopedics;  Laterality: Left;   CHOLECYSTECTOMY  1999   COLONOSCOPY  01/2012   Dr. tobie: tortous colon, multiple colon polyps, 2 cauterized in ascending colon, one transverse colon adenomatous, sigmoid polyps (6) hyperplastic.    COLONOSCOPY WITH PROPOFOL  N/A 12/25/2020   Procedure: COLONOSCOPY WITH PROPOFOL ;  Surgeon: Shaaron Lamar HERO, MD;  Location: [facility];  Service: Endoscopy;  Laterality: N/A;  11:15am   CYSTOSCOPY W/ RETROGRADES Right 05/07/2016   Procedure: CYSTOSCOPY WITH RETROGRADE  PYELOGRAM;  Surgeon: Donnice Brooks, MD;  Location: [facility];  Service: Urology;  Laterality: Right;   DX LAPAROSCOPY/  D & C HYSTEROSCOPY  2001   ESOPHAGOGASTRODUODENOSCOPY  05/2012   Dr. Tobie: esophagitis, gastritis, no hpylori   LAPAROSCOPIC SALPINGO OOPHERECTOMY Right 08/24/2006   ORIF ANKLE FRACTURE Left    POLYPECTOMY  12/25/2020   Procedure: POLYPECTOMY;  Surgeon: Shaaron Lamar HERO, MD;  Location: [facility];  Service: Endoscopy;;   RECTOCELE REPAIR N/A 01/13/2016   Procedure: POSTERIOR VAGINAL REPAIR (RECTOCELE);  Surgeon: Norleen LULLA Server, MD;  Location: [facility];  Service: Gynecology;  Laterality: N/A;   TOTAL ABDOMINAL HYSTERECTOMY Left 05/22/2002   w/  Left Salpingo Oopherectomy   TRANSTHORACIC ECHOCARDIOGRAM  06/19/2015   ef 60-65%/  trivial MR and PR   TUBAL LIGATION  1989    Family History  Problem Relation Age of Onset   Hypertension Mother    Hyperlipidemia Mother    Heart disease Father    Heart disease Brother        passed age 15 from heart valve complication of some sort   Colon polyps Brother    Colon polyps Brother        deceased age 39 from unknown   Colon cancer Neg Hx    Social History   Tobacco Use   Smoking status: Former    Current packs/day: 0.00    Average packs/day: 0.5 packs/day for 39.6 years (19.8 ttl pk-yrs)    Types: Cigarettes    Start date: 05/10/1978    Quit date: 12/14/2017    Years since quitting: 5.4    Passive exposure: Past   Smokeless tobacco: Never  Vaping Use   Vaping status: Never Used  Substance Use Topics   Alcohol use: No    Alcohol/week: 0.0 standard drinks of alcohol   Drug use: No    Allergies  Allergen Reactions   Corticosteroids Other (See Comments)    With steroids (mood changes (crankiness), shakiness)   Nitrofurantoin Monohyd Macro Nausea And Vomiting    Also gets shaky   Prednisone      Reports can take prednisone  inj but not po pills.   Sulfa  Antibiotics Rash    Current Meds   Medication Sig   cyclobenzaprine  (FLEXERIL ) 10 MG tablet Take 1 tablet (10 mg total) by mouth 2 (two) times daily as needed.   atenolol (TENORMIN) 25 MG tablet Take 25 mg by mouth daily.   butalbital-acetaminophen -caffeine (FIORICET, ESGIC) 50-325-40 MG tablet Take 1 tablet by mouth at bedtime.   dicyclomine  (BENTYL ) 10 MG capsule Take 2 capsules (20 mg total) by mouth 4 (four) times daily -  before meals and at bedtime.   DULoxetine  (CYMBALTA ) 30 MG capsule Take 30 mg  by mouth in the morning. 30 mg + 60 mg=90 mg   DULoxetine  (CYMBALTA ) 60 MG capsule Take 60 mg by mouth in the morning. 60 mg + 30 mg=90 mg   fenofibrate  (TRICOR ) 145 MG tablet Take 145 mg by mouth at bedtime.   fluticasone  (FLONASE ) 50 MCG/ACT nasal spray Place 1 spray into both nostrils 2 (two) times daily.   levocetirizine (XYZAL ) 5 MG tablet Take 5 mg by mouth every evening.   Multiple Vitamin (MULTIVITAMIN WITH MINERALS) TABS tablet Take 1 tablet by mouth in the morning.   ondansetron  (ZOFRAN ) 4 MG tablet Take 1 tablet (4 mg total) by mouth daily as needed for nausea or vomiting.   ondansetron  (ZOFRAN -ODT) 4 MG disintegrating tablet Take 4 mg by mouth every 6 (six) hours.   pantoprazole  (PROTONIX ) 40 MG tablet Take 1 tablet (40 mg total) by mouth daily. Take one tablet daily 30 minutes before breaksfast   QUEtiapine  (SEROQUEL ) 100 MG tablet Take 100 mg by mouth daily with supper.   Saccharomyces boulardii (PROBIOTIC) 250 MG CAPS Take 1 capsule by mouth daily.   simvastatin  (ZOCOR ) 40 MG tablet Take 40 mg by mouth at bedtime.   traZODone  (DESYREL ) 150 MG tablet Take 150 mg by mouth at bedtime.    Objective: BP (!) 150/83   Pulse 66   Ht 4' 11 (1.499 m)   Wt 160 lb (72.6 kg)   BMI 32.32 kg/m   Physical Exam:  General: Alert and oriented. and No acute distress. Gait: Normal gait.  Shoulder deformity.  No swelling.  No bruising.  160 degrees of forward flexion.  100 degrees of abduction.  External rotation of 40  degrees at her side.  Negative belly press.  5/5 strength in the supraspinatus and infraspinatus, with some irritation of the tendons upon testing.  Fingers are warm and well-perfused.  External rotation is similar to the contralateral side.   IMAGING: I personally ordered and reviewed the following images  X-rays of the left shoulder were obtained in clinic today.  No acute injuries noted.  No osteophytes.  No evidence of proximal humeral migration.  No bony lesions.  Impression: Negative left shoulder x-ray  New Medications:  Meds ordered this encounter  Medications   cyclobenzaprine  (FLEXERIL ) 10 MG tablet    Sig: Take 1 tablet (10 mg total) by mouth 2 (two) times daily as needed.    Dispense:  20 tablet    Refill:  0      Oneil DELENA Horde, MD  05/17/2023 9:19 AM

## 2023-06-23 ENCOUNTER — Other Ambulatory Visit (HOSPITAL_COMMUNITY): Payer: Self-pay | Admitting: Nurse Practitioner

## 2023-06-23 DIAGNOSIS — D638 Anemia in other chronic diseases classified elsewhere: Secondary | ICD-10-CM

## 2023-06-23 DIAGNOSIS — E1122 Type 2 diabetes mellitus with diabetic chronic kidney disease: Secondary | ICD-10-CM

## 2023-06-23 DIAGNOSIS — N1832 Chronic kidney disease, stage 3b: Secondary | ICD-10-CM

## 2023-06-29 ENCOUNTER — Ambulatory Visit (HOSPITAL_COMMUNITY): Payer: 59

## 2023-07-05 ENCOUNTER — Ambulatory Visit (HOSPITAL_COMMUNITY)
Admission: RE | Admit: 2023-07-05 | Discharge: 2023-07-05 | Disposition: A | Discharge status: Home / Self Care | Payer: 59 | Source: Ambulatory Visit | Attending: Nurse Practitioner | Admitting: Nurse Practitioner

## 2023-07-05 DIAGNOSIS — D638 Anemia in other chronic diseases classified elsewhere: Secondary | ICD-10-CM | POA: Insufficient documentation

## 2023-07-05 DIAGNOSIS — N1832 Chronic kidney disease, stage 3b: Secondary | ICD-10-CM | POA: Insufficient documentation

## 2023-07-05 DIAGNOSIS — E1122 Type 2 diabetes mellitus with diabetic chronic kidney disease: Secondary | ICD-10-CM | POA: Insufficient documentation

## 2023-07-07 ENCOUNTER — Encounter: Payer: Self-pay | Admitting: Neurology

## 2023-07-07 ENCOUNTER — Ambulatory Visit: Payer: 59 | Admitting: Neurology

## 2023-07-07 VITALS — BP 128/76 | HR 87 | Ht 59.0 in | Wt 164.0 lb

## 2023-07-07 DIAGNOSIS — F439 Reaction to severe stress, unspecified: Secondary | ICD-10-CM

## 2023-07-07 DIAGNOSIS — Z9189 Other specified personal risk factors, not elsewhere classified: Secondary | ICD-10-CM

## 2023-07-07 DIAGNOSIS — R519 Headache, unspecified: Secondary | ICD-10-CM

## 2023-07-07 DIAGNOSIS — Z9889 Other specified postprocedural states: Secondary | ICD-10-CM

## 2023-07-07 DIAGNOSIS — E66811 Obesity, class 1: Secondary | ICD-10-CM | POA: Diagnosis not present

## 2023-07-07 NOTE — Patient Instructions (Signed)
 It was nice to meet you today.   As discussed, your headaches are likely due to a combination of factors.   Here is what we discussed today and my recommendations for you:   Please remember, common headache triggers are: sleep deprivation, dehydration, overheating, stress, hypoglycemia or skipping meals and blood sugar fluctuations, excessive pain medications or excessive alcohol use or caffeine withdrawal. Some people have food triggers such as aged cheese, orange juice or chocolate, especially dark chocolate, or MSG (monosodium glutamate). Try to avoid these headache triggers as much possible. It may be helpful to keep a headache diary to figure out what makes your headaches worse or brings them on and what alleviates them. Some people report headache onset after exercise but studies have shown that regular exercise may actually prevent headaches from coming. If you have exercise-induced headaches, please make sure that you drink plenty of fluid before and after exercising and that you do not over do it and do not overheat. Please reduce your caffeine intake and limit yourself to 1-2 servings/day, as caffeine can drive headaches.  I recommend we proceed with a sleep study to look for signs of obstructive sleep apnea (aka OSA).  Please remember, the long-term risks and ramifications of untreated moderate to severe obstructive sleep apnea may include (but are not limited to): increased risk for cardiovascular disease, including congestive heart failure, stroke, difficult to control hypertension, treatment resistant obesity, arrhythmias, especially irregular heartbeat commonly known as A. Fib. (atrial fibrillation); even type 2 diabetes has been linked to untreated OSA.  Untreated obstructive sleep apnea can result in recurrent headaches as well as frequent nighttime urination. Please increase your intake to about 3-4 bottles of water per day, 16.9 ounce size each. We will plan a follow up after your sleep  study.  Let us know when you are ready to proceed.  Please continue to work with your PCP on stress reduction.

## 2023-07-07 NOTE — Progress Notes (Signed)
 Subjective:    Patient ID: Sabrina Beasley is a 63 y.o. female.  HPI    Huston Foley, MD, PhD [facility] [address], [address] [address] [place], [place] [address]  Dear [name],  I saw your patient, Sabrina Beasley upon your kind request in my neurologic clinic today for initial consultation of her recurrent headaches.  The patient is unaccompanied today.  As you know, [name] is a 64 year old female with an underlying medical history of subarachnoid hemorrhage, brain aneurysm with status post coiling, hyperlipidemia, palpitations, diabetes, hepatitis C, cervical degenerative disc disease, kidney stones, gout, depression, history of rheumatic fever, obesity, status post multiple surgeries including aneurysm coiling, carpal tunnel surgery, cholecystectomy, rectocele repair, kidney stone removal, and hysterectomy, who reports recurrent headaches for the past few years.  Her headaches are typically myofascial or could be in the back of her head, at the base of the head.  She describes a constant aching, typically no throbbing, typically no one-sided headache, not associated with nausea or vomiting or light sensitivity.  She is up-to-date with her eye examination and had an updated eye exam in the summer 2024 with eyeglasses.  She is regularly followed by neurosurgery for her brain aneurysm and has an appointment pending for this year and will get an updated MRA.  She saw Dr. Linton Flemings with neurosurgery at [facility] on 09/24/2021 and I reviewed the note.  I reviewed your office note from 03/31/2023.   Of note, she is on multiple medications including magnesium oxide 400 mg up to twice daily, atenolol 25 mg daily, trazodone 150 mg at bedtime, duloxetine 90 mg daily, as well as Seroquel 100 mg at bedtime.  She has a history of snoring.  She has nocturia about 2-3 times per average night.  She has never had a sleep study.  She is going to see urology as I understand or  nephrology.  She takes Fioricet as needed, not daily. She denies any sudden onset of one-sided weakness or tingling or droopy face or slurring of speech.  She had an MR angiogram of the head without contrast through [facility] clinic on 09/15/2021 and I reviewed the results:  IMPRESSION:   Unchanged RIGHT P-comm aneurysm neck remnant status post coil embolization.   She had an MR angiogram of of the head without contrast through [facility] clinic on 09/24/2020 and I reviewed the results:    IMPRESSION:   1.  Stable susceptibility artifact and signal loss overlying this the coil mass lateral aspect right posterior clavicular artery from previous coil embolization with small 0.4 x 0.2 cm residual neck.   2.  No acute infarcts or new aneurysms.   She had an MR angiogram of the head without contrast on 01/29/2020 and I reviewed the results:   IMPRESSION:   1. No significant change in residual filling of the RIGHT posterior communicating artery aneurysm neck status post embolization.  2. No additional aneurysm.    She had an MR angiogram of the head without contrast on 02/10/2019 when I reviewed the results:  IMPRESSION: 1. Evidence of distal Right ICA endovascular coil pack. 3 mm saccular area of flow signal on series 3, image 65 suggests Coil Compaction. The underlying aneurysm may arise from the right posterior communicating artery origin, where the Pcomm constitutes a fetal type right PCA origin and remains patent. 2. Otherwise negative intracranial MRA.   In addition, I personally and independently reviewed images through the PACS system.   She had a  cervical spine MRI without contrast on 06/26/2015 and I reviewed the results:   IMPRESSION: 1. At C4-5 there is a mild broad-based disc bulge with a small central disc protrusion. 2. At C5-6 there is a broad central disc protrusion contacting the ventral cervical spinal cord. Left uncovertebral degenerative changes with mild left  foraminal narrowing. 3. At C6-7 there is a mild broad-based disc bulge with bilateral uncovertebral degenerative change resulting in moderate bilateral foraminal stenosis.   In addition, I personally and independently reviewed images through the PACS system.   She is divorced and lives with her ex-husband.  She has 3 grown children.  She reports stress and is going to talk to you about starting therapy.  She tries to hydrate well, estimates that she drinks about 2-3 bottles of water per day.  She drinks caffeine in the form of coffee, 2 cups in the morning and 1 soda can per day.  She does not drink any alcohol.  She quit smoking in 2019.  Her Past Medical History Is Significant For: 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)    Bulging of cervical intervertebral disc    C4-5 &  C6-7   Constipation    DDD (degenerative disc disease), cervical    stenosis   Depression    Family history of premature CAD    GERD (gastroesophageal reflux disease)    Gout    History of endometriosis    History of kidney stones     right side w associated n/v   History of ovarian cyst    History of rheumatic fever    Hx of hepatitis C dx 04-16-2009 liver bx   mild portal/ periportal fibrosis-- lower serum level detection positive antibody virus RNA   Hyperlipidemia    Hypertension    OA (osteoarthritis)    Osteoporosis    PONV (postoperative nausea and vomiting)    pt states scopalomine patches help   Pre-diabetes    Right ureteral stone     Her Past Surgical History Is Significant For: Past Surgical History:  Procedure Laterality Date   CARDIOVASCULAR STRESS TEST  06/19/2015  dr Purvis Sheffield   normal nuclear study, low risk w/ overall LVSF normal and wall motion normal , stress ef 57%   CARPAL TUNNEL RELEASE Right x2  last one   CARPAL TUNNEL RELEASE Left 02/23/2013   Procedure: CARPAL TUNNEL RELEASE;  Surgeon: Vickki Hearing, MD;   Location: [facility];  Service: Orthopedics;  Laterality: Left;   CHOLECYSTECTOMY  1999   COLONOSCOPY  01/2012   Dr. Allena Katz: tortous colon, multiple colon polyps, 2 cauterized in ascending colon, one transverse colon adenomatous, sigmoid polyps (6) hyperplastic.    COLONOSCOPY WITH PROPOFOL N/A 12/25/2020   Procedure: COLONOSCOPY WITH PROPOFOL;  Surgeon: Corbin Ade, MD;  Location: [facility];  Service: Endoscopy;  Laterality: N/A;  11:15am   CYSTOSCOPY W/ RETROGRADES Right 05/07/2016   Procedure: CYSTOSCOPY WITH RETROGRADE PYELOGRAM;  Surgeon: Jerilee Field, MD;  Location[facility];  Service: Urology;  Laterality: Right;   DX LAPAROSCOPY/  D & C HYSTEROSCOPY  2001   ESOPHAGOGASTRODUODENOSCOPY  05/2012   Dr. Allena Katz: esophagitis, gastritis, no hpylori   LAPAROSCOPIC SALPINGO OOPHERECTOMY Right 08/24/2006   ORIF ANKLE FRACTURE Left    POLYPECTOMY  12/25/2020   Procedure: POLYPECTOMY;  Surgeon: Corbin Ade, MD;  Location: [facility];  Service: Endoscopy;;   RECTOCELE  REPAIR N/A 01/13/2016   Procedure: POSTERIOR VAGINAL REPAIR (RECTOCELE);  Surgeon: Tilda Burrow, MD;  Location: [facility];  Service: Gynecology;  Laterality: N/A;   TOTAL ABDOMINAL HYSTERECTOMY Left 05/22/2002   w/  Left Salpingo Oopherectomy   TRANSTHORACIC ECHOCARDIOGRAM  06/19/2015   ef 60-65%/  trivial MR and PR   TUBAL LIGATION  1989    Her Family History Is Significant For: Family History  Problem Relation Age of Onset   Hypertension Mother    Hyperlipidemia Mother    Osteoporosis Mother    Depression Mother    Heart disease Father    Heart disease Brother        passed age 45 from heart valve complication of some sort   Colon polyps Brother    Colon polyps Brother        deceased age 4 from "unknown"   Osteoporosis Maternal Grandmother    Thyroid disease Maternal Grandmother    Diabetes Maternal Grandmother    Colon cancer Neg Hx     Her Social History Is Significant For: Social  History   Socioeconomic History   Marital status: Divorced    Spouse name: Not on file   Number of children: Not on file   Years of education: Not on file   Highest education level: Not on file  Occupational History   Not on file  Tobacco Use   Smoking status: Former    Current packs/day: 0.00    Average packs/day: 0.5 packs/day for 39.6 years (19.8 ttl pk-yrs)    Types: Cigarettes    Start date: 05/10/1978    Quit date: 12/14/2017    Years since quitting: 5.5    Passive exposure: Past   Smokeless tobacco: Never  Vaping Use   Vaping status: Never Used  Substance and Sexual Activity   Alcohol use: No    Alcohol/week: 0.0 standard drinks of alcohol   Drug use: No   Sexual activity: Not Currently    Partners: Male    Birth control/protection: Surgical  Other Topics Concern   Not on file  Social History Narrative   Not on file   Social Drivers of Health   Financial Resource Strain: Not on file  Food Insecurity: No Food Insecurity (12/05/2022)   Hunger Vital Sign    Worried About Running Out of Food in the Last Year: Never true    Ran Out of Food in the Last Year: Never true  Transportation Needs: No Transportation Needs (12/05/2022)   PRAPARE - Administrator, Civil Service (Medical): No    Lack of Transportation (Non-Medical): No  Physical Activity: Not on file  Stress: Not on file  Social Connections: Not on file    Her Allergies Are:  Allergies  Allergen Reactions   Corticosteroids Other (See Comments)    With steroids (mood changes (crankiness), shakiness)   Nitrofurantoin Monohyd Macro Nausea And Vomiting    Also gets shaky   Prednisone     Reports can take prednisone inj but not po pills.   Sulfa Antibiotics Rash  :   Her Current Medications Are:  Outpatient Encounter Medications as of 07/07/2023  Medication Sig   atenolol (TENORMIN) 25 MG tablet Take 25 mg by mouth daily.   butalbital-acetaminophen-caffeine (FIORICET, ESGIC) 50-325-40 MG tablet  Take 1 tablet by mouth at bedtime.   cyclobenzaprine (FLEXERIL) 10 MG tablet Take 1 tablet (10 mg total) by mouth 2 (two) times daily as needed.   dicyclomine (BENTYL) 10 MG  capsule Take 2 capsules (20 mg total) by mouth 4 (four) times daily -  before meals and at bedtime.   doxycycline (VIBRAMYCIN) 100 MG capsule Take 100 mg by mouth 2 (two) times daily.   DULoxetine (CYMBALTA) 30 MG capsule Take 30 mg by mouth in the morning. 30 mg + 60 mg=90 mg   DULoxetine (CYMBALTA) 60 MG capsule Take 60 mg by mouth in the morning. 60 mg + 30 mg=90 mg   fenofibrate (TRICOR) 145 MG tablet Take 145 mg by mouth at bedtime.   fluticasone (FLONASE) 50 MCG/ACT nasal spray Place 1 spray into both nostrils 2 (two) times daily.   levocetirizine (XYZAL) 5 MG tablet Take 5 mg by mouth every evening.   Melatonin 5 MG CAPS Take 5 mg by mouth daily.   metFORMIN (GLUCOPHAGE) 500 MG tablet Take 500 mg by mouth daily with breakfast.   Multiple Vitamin (MULTIVITAMIN WITH MINERALS) TABS tablet Take 1 tablet by mouth in the morning.   omeprazole (PRILOSEC) 20 MG capsule Take 20 mg by mouth daily.   ondansetron (ZOFRAN) 4 MG tablet Take 1 tablet (4 mg total) by mouth daily as needed for nausea or vomiting.   ondansetron (ZOFRAN-ODT) 4 MG disintegrating tablet Take 4 mg by mouth every 6 (six) hours.   pantoprazole (PROTONIX) 40 MG tablet Take 1 tablet (40 mg total) by mouth daily. Take one tablet daily 30 minutes before breaksfast   QUEtiapine (SEROQUEL) 100 MG tablet Take 100 mg by mouth daily with supper.   Saccharomyces boulardii (PROBIOTIC) 250 MG CAPS Take 1 capsule by mouth daily.   simvastatin (ZOCOR) 40 MG tablet Take 40 mg by mouth at bedtime.   traZODone (DESYREL) 150 MG tablet Take 150 mg by mouth at bedtime.   tretinoin (RETIN-A) 0.025 % cream APPLY CREAM EXTERNALLY TO FACE AREA AT BEDTIME   No facility-administered encounter medications on file as of 07/07/2023.  :   Review of Systems:  Out of a complete 14  point review of systems, all are reviewed and negative with the exception of these symptoms as listed below:   Review of Systems  Neurological:        Patient in room #9 and alone. Patient states for years she been having headaches everyday. Patient states she has history of ruptured aneurysm. ESS-0 & FSS-22    Objective:  Neurological Exam  Physical Exam Physical Examination:   Vitals:   07/07/23 1437  BP: 128/76  Pulse: 87    General Examination: The patient is a very pleasant 63 y.o. female in no acute distress. She appears well-developed and well-nourished and well groomed.   HEENT: Normocephalic, atraumatic, pupils are equal, round and reactive to light, very mild cataracts noted, funduscopic exam benign. Corrective eyeglasses in place.  Extraocular tracking is good without limitation to gaze excursion or nystagmus noted. Hearing is grossly intact. Face is symmetric with normal facial animation and normal facial sensation to light touch, temperature and vibration. Speech is clear with no dysarthria noted. There is no hypophonia. There is no lip, neck/head, jaw or voice tremor. Neck is supple with full range of passive and active motion. There are no carotid bruits on auscultation. Oropharynx exam reveals: mild mouth dryness, adequate dental hygiene with partial dentures top and bottom, moderate airway crowding secondary to small airway entry, tonsillar size of 1-2+, Mallampati class II.  Neck circumference 15-7/8 inches.  Tongue protrudes centrally and palate elevates symmetrically.  Chest: Clear to auscultation without wheezing, rhonchi or crackles noted.  Heart: S1+S2+0, regular and normal without murmurs, rubs or gallops noted.   Abdomen: Soft, non-tender and non-distended.  Extremities: There is no pitting edema in the distal lower extremities bilaterally.   Skin: Warm and dry without trophic changes noted.   Musculoskeletal: exam reveals left ankle slightly wider than  right, she has hardware in place on the left from a prior ankle fracture and surgery.     Neurologically:  Mental status: The patient is awake, alert and oriented in all 4 spheres. Her immediate and remote memory, attention, language skills and fund of knowledge are appropriate. There is no evidence of aphasia, agnosia, apraxia or anomia. Speech is clear with normal prosody and enunciation. Thought process is linear. Mood is normal and affect is normal.  Cranial nerves II - XII are as described above under HEENT exam.  Motor exam: Normal bulk, strength and tone is noted. There is no obvious action or resting tremor.  No drift or rebound. Fine motor skills and coordination: intact finger taps, hand movements and rapid alternating patting in both upper extremities, normal foot taps bilaterally the lower extremities.  Cerebellar testing: No dysmetria or intention tremor. There is no truncal or gait ataxia.  Normal finger-to-nose, normal heel-to-shin bilaterally. Sensory exam: intact to light touch, temperature and vibration sense in the upper and lower extremities.  Romberg is negative. Reflexes 2+ throughout, toes are downgoing bilaterally. Gait, station and balance: She stands easily. No veering to one side is noted. No leaning to one side is noted. Posture is age-appropriate and stance is narrow based. Gait shows normal stride length and normal pace. No problems turning are noted.  Normal tandem walk.  Assessment and Plan:  In summary, NENE ARANAS is a very pleasant 63 year old female with an underlying medical history of subarachnoid hemorrhage, brain aneurysm with status post coiling, hyperlipidemia, palpitations, diabetes, hepatitis C, cervical degenerative disc disease, kidney stones, gout, depression, history of rheumatic fever, obesity, status post multiple surgeries including aneurysm coiling, carpal tunnel surgery, cholecystectomy, rectocele repair, kidney stone removal, and hysterectomy, who  presents for evaluation of her recurrent head aches of several years' duration.  History and examination are not in keeping with classic migraines.  Exam is nonfocal and she is reassured.  She may have cervicogenic headaches and also headaches secondary to stress, underlying sleep disordered breathing is not excluded.  I had a long discussion with the patient today.  This was an extended visit with copious record review involved including multiple scans which I personally reviewed as well as outside electronic records and paper records and considerable counseling and coordination of care involved.  I had multiple suggestions for her.  I do not recommend any prescription medication as she is already on multiple psychotropic medications.  She is not ready to pursue a sleep study.  She cites stress currently.  She is advised to follow-up with you regarding stress management as stress can also be a trigger for headaches.   Below is a summary of my discussion points and recommendations for from today's visit.  She was given instructions verbally and also electronically in her after visit summary which she can access electronically via MyChart:  << Please remember, common headache triggers are: sleep deprivation, dehydration, overheating, stress, hypoglycemia or skipping meals and blood sugar fluctuations, excessive pain medications or excessive alcohol use or caffeine withdrawal. Some people have food triggers such as aged cheese, orange juice or chocolate, especially dark chocolate, or MSG (monosodium glutamate). Try to avoid these headache triggers  as much possible. It may be helpful to keep a headache diary to figure out what makes your headaches worse or brings them on and what alleviates them. Some people report headache onset after exercise but studies have shown that regular exercise may actually prevent headaches from coming. If you have exercise-induced headaches, please make sure that you drink plenty of  fluid before and after exercising and that you do not over do it and do not overheat. Please reduce your caffeine intake and limit yourself to 1-2 servings/day, as caffeine can drive headaches.  I recommend we proceed with a sleep study to look for signs of obstructive sleep apnea (aka OSA).  Please remember, the long-term risks and ramifications of untreated moderate to severe obstructive sleep apnea may include (but are not limited to): increased risk for cardiovascular disease, including congestive heart failure, stroke, difficult to control hypertension, treatment resistant obesity, arrhythmias, especially irregular heartbeat commonly known as A. Fib. (atrial fibrillation); even type 2 diabetes has been linked to untreated OSA.  Untreated obstructive sleep apnea can result in recurrent headaches as well as frequent nighttime urination. Please increase your intake to about 3-4 bottles of water per day, 16.9 ounce size each. We will plan a follow up after your sleep study.  Let us know when you are ready to proceed.  Please continue to work with your PCP on stress reduction.  >>  We will plan to follow-up after sleep testing.  She is encouraged to call our office once she is ready to proceed with a sleep study.  She is encouraged to keep her appointment with her other providers including her neurosurgeon and your office.    Thank you very much for allowing me to participate in the care of this nice patient. If I can be of any further assistance to you please do not hesitate to call me at [phone].  Sincerely,   Huston Foley, MD, PhD

## 2023-09-15 ENCOUNTER — Ambulatory Visit: Admitting: Orthopedic Surgery

## 2023-09-15 ENCOUNTER — Encounter: Payer: Self-pay | Admitting: Orthopedic Surgery

## 2023-09-15 VITALS — BP 128/71 | HR 70 | Ht 59.0 in | Wt 155.0 lb

## 2023-09-15 DIAGNOSIS — M65311 Trigger thumb, right thumb: Secondary | ICD-10-CM | POA: Diagnosis not present

## 2023-09-15 MED ORDER — METHYLPREDNISOLONE ACETATE 40 MG/ML IJ SUSP
40.0000 mg | Freq: Once | INTRAMUSCULAR | Status: AC
  Administered 2023-09-15: 40 mg via INTRA_ARTICULAR

## 2023-09-15 NOTE — Progress Notes (Signed)
  Intake history:  BP 128/71   Pulse 70   Ht 4\' 11"  (1.499 m)   Wt 155 lb (70.3 kg)   BMI 31.31 kg/m  Body mass index is 31.31 kg/m.    WHAT ARE WE SEEING YOU FOR TODAY?   right hand(s) Thumb triggering  How long has this bothered you? (DOI?DOS?WS?)    Anticoag.  No  Diabetes Yes  Heart disease No  Hypertension Yes  SMOKING HX No  Kidney disease Yes  Any ALLERGIES ______________________________________________   Treatment:  Have you taken:  Tylenol  No  Advil No  Had PT No  Had injection No  Other  _________________________

## 2023-09-15 NOTE — Progress Notes (Signed)
 Encounter Diagnosis  Name Primary?   Trigger thumb of right hand Yes    Injection right thumb trigger phenomenon  Right Trigger thumb injection Medication  1 mL of 40 mg Depo-Medrol   2 mL of 1% lidocaine  plain  Ethyl chloride for anesthesia  Verbal consent was obtained timeout was taken to confirm the injection site as right thumb  Alcohol was used to prepare the skin along with ethyl chloride and then the injection was made at the A1 pulley there were no complications     Intake history:  BP 128/71   Pulse 70   Ht 4\' 11"  (1.499 m)   Wt 155 lb (70.3 kg)   BMI 31.31 kg/m  Body mass index is 31.31 kg/m.    WHAT ARE WE SEEING YOU FOR TODAY?   right hand(s) Thumb triggering  How long has this bothered you? (DOI?DOS?WS?)    Anticoag.  No  Diabetes Yes  Heart disease No  Hypertension Yes  SMOKING HX No  Kidney disease Yes  Any ALLERGIES ______________________________________________   Treatment:  Have you taken:  Tylenol  No  Advil No  Had PT No  Had injection No  Other  _________________________   Chief Complaint  Patient presents with   Hand Problem    Right     This is a 63 year old female who had a carpal tunnel release in 2014 left hand she also had a trigger finger injection right thumb 10 years ago presents with recurrent triggering of the right thumb  She complains of popping and catching of the right thumb with tenderness and pain over the A1 pulley  Review of systems no evidence of tingling numbness or vascular compromise  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)    Bulging of cervical intervertebral disc    C4-5 &  C6-7   Constipation    DDD (degenerative disc disease), cervical    stenosis   Depression    Family history of premature CAD    GERD (gastroesophageal reflux disease)    Gout    History of endometriosis    History of kidney stones     right side w  associated n/v   History of ovarian cyst    History of rheumatic fever    Hx of hepatitis C dx 04-16-2009 liver bx   mild portal/ periportal fibrosis-- lower serum level detection positive antibody virus RNA   Hyperlipidemia    Hypertension    OA (osteoarthritis)    Osteoporosis    PONV (postoperative nausea and vomiting)    pt states scopalomine patches help   Pre-diabetes    Right ureteral stone     BP 128/71   Pulse 70   Ht 4\' 11"  (1.499 m)   Wt 155 lb (70.3 kg)   BMI 31.31 kg/m   Physical Exam Vitals and nursing note reviewed.  Constitutional:      Appearance: Normal appearance.  HENT:     Head: Normocephalic and atraumatic.  Eyes:     General: No scleral icterus.       Right eye: No discharge.        Left eye: No discharge.     Extraocular Movements: Extraocular movements intact.     Conjunctiva/sclera: Conjunctivae normal.     Pupils: Pupils are equal, round, and reactive to light.  Cardiovascular:     Rate and Rhythm: Normal rate.     Pulses: Normal  pulses.  Musculoskeletal:     Comments: Right thumb is actually held in flexion but she can actively extend it without manipulation digitally  She has tenderness over the A1 pulley the color and capillary refill of the thumb is normal and has normal sensation with intact extensor function at the IP joint as well as flexion.  Skin:    General: Skin is warm and dry.     Capillary Refill: Capillary refill takes less than 2 seconds.  Neurological:     General: No focal deficit present.     Mental Status: She is alert and oriented to person, place, and time.  Psychiatric:        Mood and Affect: Mood normal.        Behavior: Behavior normal.        Thought Content: Thought content normal.        Judgment: Judgment normal.

## 2023-11-18 ENCOUNTER — Ambulatory Visit
Admission: EM | Admit: 2023-11-18 | Discharge: 2023-11-18 | Disposition: A | Discharge status: Home / Self Care | Attending: Family Medicine | Admitting: Family Medicine

## 2023-11-18 DIAGNOSIS — M5432 Sciatica, left side: Secondary | ICD-10-CM | POA: Diagnosis not present

## 2023-11-18 MED ORDER — DEXAMETHASONE SODIUM PHOSPHATE 10 MG/ML IJ SOLN
10.0000 mg | Freq: Once | INTRAMUSCULAR | Status: AC
  Administered 2023-11-18: 10 mg via INTRAMUSCULAR

## 2023-11-18 MED ORDER — TIZANIDINE HCL 4 MG PO CAPS: 4.0000 mg | ORAL_CAPSULE | Freq: Three times a day (TID) | ORAL | 0 refills | Status: AC | PRN

## 2023-11-18 NOTE — ED Provider Notes (Signed)
 [facility]    CSN: [id number] Arrival date & time: 11/18/23  1214      History   Chief Complaint Chief Complaint  Patient presents with   Back Pain    Lower back pain x3 weeks    HPI Sabrina Beasley is a 63 y.o. female.   Patient presenting today with 3-week history of left posterior buttock pain radiating down the back of the left leg.  Denies any known injury prior to onset and denies weakness, numbness, tingling, bowel or bladder incontinence, saddle anesthesias, urinary symptoms, fevers.  So far trying over-the-counter anti-inflammatory medications with minimal relief.    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)    Bulging of cervical intervertebral disc    C4-5 &  C6-7   Constipation    DDD (degenerative disc disease), cervical    stenosis   Depression    Family history of premature CAD    GERD (gastroesophageal reflux disease)    Gout    History of endometriosis    History of kidney stones     right side w associated n/v   History of ovarian cyst    History of rheumatic fever    Hx of hepatitis C dx 04-16-2009 liver bx   mild portal/ periportal fibrosis-- lower serum level detection positive antibody virus RNA   Hyperlipidemia    Hypertension    OA (osteoarthritis)    Osteoporosis    PONV (postoperative nausea and vomiting)    pt states scopalomine patches help   Pre-diabetes    Right ureteral stone     Patient Active Problem List   Diagnosis Date Noted   ERRONEOUS ENCOUNTER--DISREGARD 12/23/2022   AKI (acute kidney injury) (HCC) 12/05/2022   Snoring 06/17/2022   Sensorineural hearing loss (SNHL) of both ears 03/18/2021   Anxiety 05/01/2019   Hypertension 05/01/2019   GERD (gastroesophageal reflux disease) 06/19/2018   Constipation 06/19/2018   Cerebral aneurysm 02/07/2018   SAH (subarachnoid hemorrhage) (HCC) 08/22/2017   Thrombocytosis 02/23/2017   Hx of hepatitis C 07/01/2016    Rectocele 12/23/2015   Diabetes mellitus without complication (HCC) 09/03/2015   Chest pain 01/10/2014   Hyperlipidemia 01/10/2014   S/P carpal tunnel release 02/26/2013   CTS (carpal tunnel syndrome) 02/14/2013   Arm numbness 11/06/2010    Past Surgical History:  Procedure Laterality Date   CARDIOVASCULAR STRESS TEST  06/19/2015  dr charls   normal nuclear study, low risk w/ overall LVSF normal and wall motion normal , stress ef 57%   CARPAL TUNNEL RELEASE Right x2  last one   CARPAL TUNNEL RELEASE Left 02/23/2013   Procedure: CARPAL TUNNEL RELEASE;  Surgeon: Taft FORBES Minerva, MD;  Location: [facility];  Service: Orthopedics;  Laterality: Left;   CHOLECYSTECTOMY  1999   COLONOSCOPY  01/2012   Dr. tobie: tortous colon, multiple colon polyps, 2 cauterized in ascending colon, one transverse colon adenomatous, sigmoid polyps (6) hyperplastic.    COLONOSCOPY WITH PROPOFOL  N/A 12/25/2020   Procedure: COLONOSCOPY WITH PROPOFOL ;  Surgeon: Shaaron Lamar HERO, MD;  Location: [facility];  Service: Endoscopy;  Laterality: N/A;  11:15am   CYSTOSCOPY W/ RETROGRADES Right 05/07/2016   Procedure: CYSTOSCOPY WITH RETROGRADE PYELOGRAM;  Surgeon: Donnice Brooks, MD;  Location: [facility];  Service: Urology;  Laterality: Right;   DX LAPAROSCOPY/  D & C HYSTEROSCOPY  2001   ESOPHAGOGASTRODUODENOSCOPY  05/2012   Dr.  Patel: esophagitis, gastritis, no hpylori   LAPAROSCOPIC SALPINGO OOPHERECTOMY Right 08/24/2006   ORIF ANKLE FRACTURE Left    POLYPECTOMY  12/25/2020   Procedure: POLYPECTOMY;  Surgeon: Shaaron Lamar HERO, MD;  Location: [facility];  Service: Endoscopy;;   RECTOCELE REPAIR N/A 01/13/2016   Procedure: POSTERIOR VAGINAL REPAIR (RECTOCELE);  Surgeon: Norleen LULLA Server, MD;  Location: [facility];  Service: Gynecology;  Laterality: N/A;   TOTAL ABDOMINAL HYSTERECTOMY Left 05/22/2002   w/  Left Salpingo Oopherectomy   TRANSTHORACIC ECHOCARDIOGRAM  06/19/2015   ef 60-65%/  trivial  MR and PR   TUBAL LIGATION  1989    OB History     Gravida  3   Para  3   Term  3   Preterm      AB      Living         SAB      IAB      Ectopic      Multiple      Live Births               Home Medications    Prior to Admission medications   Medication Sig Start Date End Date Taking? Authorizing Provider  atenolol (TENORMIN) 25 MG tablet Take 25 mg by mouth daily. 11/13/22  Yes [provider]  butalbital-acetaminophen -caffeine (FIORICET, ESGIC) 50-325-40 MG tablet Take 1 tablet by mouth at bedtime. 11/14/17  Yes [provider]  cyclobenzaprine  (FLEXERIL ) 10 MG tablet Take 1 tablet (10 mg total) by mouth 2 (two) times daily as needed. 05/17/23  Yes Onesimo Oneil LABOR, MD  dicyclomine  (BENTYL ) 10 MG capsule Take 2 capsules (20 mg total) by mouth 4 (four) times daily -  before meals and at bedtime. 12/06/22  Yes Shah, Pratik D, DO  DULoxetine  (CYMBALTA ) 30 MG capsule Take 30 mg by mouth in the morning. 30 mg + 60 mg=90 mg 06/30/20  Yes [provider]  DULoxetine  (CYMBALTA ) 60 MG capsule Take 60 mg by mouth in the morning. 60 mg + 30 mg=90 mg 06/08/17  Yes [provider]  fenofibrate  (TRICOR ) 145 MG tablet Take 145 mg by mouth at bedtime. 10/16/20  Yes [provider]  fluticasone  (FLONASE ) 50 MCG/ACT nasal spray Place 1 spray into both nostrils 2 (two) times daily. 04/10/23  Yes Stuart Vernell Norris, PA-C  levocetirizine (XYZAL ) 5 MG tablet Take 5 mg by mouth every evening.   Yes [provider]  lisinopril (ZESTRIL) 40 MG tablet Take 40 mg by mouth daily. 07/04/23  Yes [provider]  Melatonin 5 MG CAPS Take 5 mg by mouth daily.   Yes [provider]  Multiple Vitamin (MULTIVITAMIN WITH MINERALS) TABS tablet Take 1 tablet by mouth in the morning.   Yes [provider]  omeprazole  (PRILOSEC) 20 MG capsule Take 20 mg by mouth daily. 05/05/23  Yes [provider]  ondansetron  (ZOFRAN ) 4  MG tablet Take 1 tablet (4 mg total) by mouth daily as needed for nausea or vomiting. 12/06/22 12/06/23 Yes Shah, Pratik D, DO  ondansetron  (ZOFRAN -ODT) 4 MG disintegrating tablet Take 4 mg by mouth every 6 (six) hours. 10/06/22  Yes [provider]  OZEMPIC, 0.25 OR 0.5 MG/DOSE, 2 MG/3ML SOPN INJECT 0.25 MG SUBCUTANEOUSLY ONCE A WEEK FOR 4 WEEKS AND THEN 0.5 MG ONCE WEEKLY FOR 2 WEEKS 09/02/23  Yes [provider]  pantoprazole  (PROTONIX ) 40 MG tablet Take 1 tablet (40 mg total) by mouth daily. Take one  tablet daily 30 minutes before breaksfast 04/14/22  Yes Rourk, Lamar HERO, MD  QUEtiapine  (SEROQUEL ) 100 MG tablet Take 100 mg by mouth daily with supper.   Yes [provider]  Saccharomyces boulardii (PROBIOTIC) 250 MG CAPS Take 1 capsule by mouth daily. 09/08/22  Yes [provider]  simvastatin  (ZOCOR ) 40 MG tablet Take 40 mg by mouth at bedtime.   Yes [provider]  tiZANidine  (ZANAFLEX ) 4 MG capsule Take 1 capsule (4 mg total) by mouth 3 (three) times daily as needed for muscle spasms. Do not drink alcohol or drive while taking this medication. May cause drowsiness 11/18/23  Yes Stuart Vernell Norris, PA-C  traZODone  (DESYREL ) 150 MG tablet Take 150 mg by mouth at bedtime.   Yes [provider]  tretinoin (RETIN-A) 0.025 % cream APPLY CREAM EXTERNALLY TO FACE AREA AT BEDTIME 04/25/23  Yes [provider]  doxycycline (VIBRAMYCIN) 100 MG capsule Take 100 mg by mouth 2 (two) times daily.    [provider]  metFORMIN (GLUCOPHAGE) 500 MG tablet Take 500 mg by mouth daily with breakfast. 04/15/23   [provider]    Family History Family History  Problem Relation Age of Onset   Hypertension Mother    Hyperlipidemia Mother    Osteoporosis Mother    Depression Mother    Heart disease Father    Heart disease Brother        passed age 35 from heart valve complication of some sort   Colon polyps Brother    Colon polyps  Brother        deceased age 13 from unknown   Osteoporosis Maternal Grandmother    Thyroid disease Maternal Grandmother    Diabetes Maternal Grandmother    Colon cancer Neg Hx     Social History Social History   Tobacco Use   Smoking status: Former    Current packs/day: 0.00    Average packs/day: 0.5 packs/day for 39.6 years (19.8 ttl pk-yrs)    Types: Cigarettes    Start date: 05/10/1978    Quit date: 12/14/2017    Years since quitting: 5.9    Passive exposure: Past   Smokeless tobacco: Never  Vaping Use   Vaping status: Never Used  Substance Use Topics   Alcohol use: No    Alcohol/week: 0.0 standard drinks of alcohol   Drug use: No     Allergies   Corticosteroids, Nitrofurantoin monohyd macro, Prednisone , and Sulfa  antibiotics   Review of Systems Review of Systems Per HPI  Physical Exam Triage Vital Signs ED Triage Vitals  Encounter Vitals Group     BP 11/18/23 1223 111/72     Girls Systolic BP Percentile --      Girls Diastolic BP Percentile --      Boys Systolic BP Percentile --      Boys Diastolic BP Percentile --      Pulse Rate 11/18/23 1223 84     Resp 11/18/23 1223 17     Temp 11/18/23 1223 98.8 F (37.1 C)     Temp Source 11/18/23 1223 Oral     SpO2 11/18/23 1223 94 %     Weight 11/18/23 1221 155 lb (70.3 kg)     Height 11/18/23 1221 4' 11 (1.499 m)     Head Circumference --      Peak Flow --      Pain Score 11/18/23 1221 8     Pain Loc --      Pain Education --  Exclude from Growth Chart --    No data found.  Updated Vital Signs BP 111/72 (BP Location: Right Arm)   Pulse 84   Temp 98.8 F (37.1 C) (Oral)   Resp 17   Ht 4' 11 (1.499 m)   Wt 155 lb (70.3 kg)   SpO2 94%   BMI 31.31 kg/m   Visual Acuity Right Eye Distance:   Left Eye Distance:   Bilateral Distance:    Right Eye Near:   Left Eye Near:    Bilateral Near:     Physical Exam Vitals and nursing note reviewed.  Constitutional:      Appearance: Normal  appearance. She is not ill-appearing.  HENT:     Head: Atraumatic.  Eyes:     Extraocular Movements: Extraocular movements intact.     Conjunctiva/sclera: Conjunctivae normal.  Cardiovascular:     Rate and Rhythm: Normal rate.  Pulmonary:     Effort: Pulmonary effort is normal.  Musculoskeletal:        General: Normal range of motion.     Cervical back: Normal range of motion and neck supple.     Comments: Negative straight leg raise bilateral lower extremities.  No midline spinal tenderness to palpation diffusely.  Tenderness to palpation to the left posterior buttock extending to left upper leg posteriorly  Skin:    General: Skin is warm and dry.  Neurological:     Mental Status: She is alert and oriented to person, place, and time.     Comments: Bilateral lower extremities neurovascularly intact  Psychiatric:        Mood and Affect: Mood normal.        Thought Content: Thought content normal.        Judgment: Judgment normal.      UC Treatments / Results  Labs (all labs ordered are listed, but only abnormal results are displayed) Labs Reviewed - No data to display  EKG   Radiology No results found.  Procedures Procedures (including critical care time)  Medications Ordered in UC Medications  dexamethasone  (DECADRON ) injection 10 mg (10 mg Intramuscular Given 11/18/23 1253)    Initial Impression / Assessment and Plan / UC Course  I have reviewed the triage vital signs and the nursing notes.  Pertinent labs & imaging results that were available during my care of the patient were reviewed by me and considered in my medical decision making (see chart for details).     Consistent with left-sided sciatica, no red flag findings today.  Treat with IM Decadron , Zanaflex , heat, massage, stretches, rest.  Return for worsening symptoms.  Final Clinical Impressions(s) / UC Diagnoses   Final diagnoses:  Left sided sciatica   Discharge Instructions   None    ED  Prescriptions     Medication Sig Dispense Auth. Provider   tiZANidine  (ZANAFLEX ) 4 MG capsule Take 1 capsule (4 mg total) by mouth 3 (three) times daily as needed for muscle spasms. Do not drink alcohol or drive while taking this medication. May cause drowsiness 15 capsule Stuart Vernell Norris, [place]      PDMP not reviewed this encounter.   Stuart Vernell Norris, [place] 11/18/23 1334

## 2023-11-18 NOTE — ED Triage Notes (Signed)
 Pt states that she has some lower back pain that radiates down her left leg. X3 weeks

## 2024-02-13 ENCOUNTER — Other Ambulatory Visit (HOSPITAL_COMMUNITY): Payer: Self-pay | Admitting: Nurse Practitioner

## 2024-02-13 DIAGNOSIS — Z1231 Encounter for screening mammogram for malignant neoplasm of breast: Secondary | ICD-10-CM

## 2024-02-20 ENCOUNTER — Ambulatory Visit (HOSPITAL_COMMUNITY)
Admission: RE | Admit: 2024-02-20 | Discharge: 2024-02-20 | Disposition: A | Discharge status: Home / Self Care | Source: Ambulatory Visit | Attending: Nurse Practitioner | Admitting: Nurse Practitioner

## 2024-02-20 DIAGNOSIS — Z1231 Encounter for screening mammogram for malignant neoplasm of breast: Secondary | ICD-10-CM | POA: Insufficient documentation

## 2024-04-09 DIAGNOSIS — J449 Chronic obstructive pulmonary disease, unspecified: Secondary | ICD-10-CM | POA: Insufficient documentation

## 2024-04-09 DIAGNOSIS — N189 Chronic kidney disease, unspecified: Secondary | ICD-10-CM | POA: Insufficient documentation

## 2024-04-11 ENCOUNTER — Other Ambulatory Visit: Payer: Self-pay

## 2024-04-11 ENCOUNTER — Ambulatory Visit: Admitting: Orthopedic Surgery

## 2024-04-11 VITALS — BP 114/73 | HR 54 | Ht 59.0 in | Wt 151.0 lb

## 2024-04-11 DIAGNOSIS — M25511 Pain in right shoulder: Secondary | ICD-10-CM

## 2024-04-11 DIAGNOSIS — M7521 Bicipital tendinitis, right shoulder: Secondary | ICD-10-CM

## 2024-04-11 MED ORDER — METHYLPREDNISOLONE ACETATE 40 MG/ML IJ SUSP
40.0000 mg | Freq: Once | INTRAMUSCULAR | Status: AC
  Administered 2024-04-11: 40 mg

## 2024-04-11 MED ORDER — CELECOXIB 100 MG PO CAPS: 100.0000 mg | ORAL_CAPSULE | Freq: Two times a day (BID) | ORAL | 1 refills | Status: AC

## 2024-04-11 NOTE — Progress Notes (Signed)
   Chief Complaint  Patient presents with   Shoulder Pain    Right shoulder Pain since August , no injury     Shoulder Pain  The pain is present in the right arm and right shoulder. This is a new problem. The current episode started more than 1 month ago. There has been no history of extremity trauma. The problem occurs intermittently. The problem has been waxing and waning. The quality of the pain is described as aching. The pain is moderate. Pertinent negatives include no fever, itching, joint locking, joint swelling, limited range of motion, numbness, stiffness or tingling. Exacerbated by: Internal rotation, picking up small objects. She has tried NSAIDS and acetaminophen  for the symptoms. The treatment provided no relief. Family history does not include gout or rheumatoid arthritis. Her past medical history is significant for diabetes.    PHYSICAL EXAM:   Focused exam right shoulder The biceps area is tender and the biceps tendon is tender full active and passive range of motion no weakness  Speed test normal mild discomfort, passive extension of the arm no pain No cuff weakness. No instability. Skin no erythema, no palpable masses  Color capillary refill normal  Neurologic sensation normal  Assessment and Plan:   DG Shoulder Right Result Date: 04/11/2024 Image report right shoulder 4 views normal glenohumeral articulation humeral head and glenoid normal acromion Mild narrowing of the acromioclavicular joint with small osteophytes Humeral shaft normal right chest cavity normal Impression  mild AC joint arthritis normal glenohumeral joint     Encounter Diagnoses  Name Primary?   Acute pain of right shoulder    Biceps tendonitis on right Yes    Assessment and plan 63 year old female probably has biceps tendinitis of the right shoulder and arm  Recommend injection subacromial space plus anti-inflammatories.  She is to stop the ibuprofen which she was taking 3 times a day at  800 mg and start prescription NSAID Celebrex  If no improvement after 6 weeks we may have to send her to formal physical therapy and do a direct biceps tendon injection under ultrasound guidance    Procedure note the subacromial injection shoulder RIGHT    Verbal consent was obtained to inject the  RIGHT   Shoulder  Timeout was completed to confirm the injection site is a subacromial space of the  RIGHT  shoulder   Medication used Depo-Medrol  40 mg and lidocaine  1% 3 cc  Anesthesia was provided by ethyl chloride  The injection was performed in the RIGHT  posterior subacromial space. After pinning the skin with alcohol and anesthetized the skin with ethyl chloride the subacromial space was injected using a 20-gauge needle. There were no complications  Sterile dressing was applied.    Meds ordered this encounter  Medications   celecoxib (CELEBREX) 100 MG capsule    Sig: Take 1 capsule (100 mg total) by mouth 2 (two) times daily.    Dispense:  60 capsule    Refill:  1

## 2024-04-11 NOTE — Progress Notes (Signed)
  Intake history:  No chief complaint on file.    BP 114/73   Pulse (!) 54   Ht 4' 11 (1.499 m)   Wt 151 lb (68.5 kg)   BMI 30.50 kg/m  Body mass index is 30.5 kg/m.  Pharmacy? ______________________________________  WHAT ARE WE SEEING YOU FOR TODAY?   right arm(s)  How [place] has this bothered you? (DOI?DOS?WS?)  on last of August pain came on suddenly been trying to work it out but still there worse when I use it or turn it  Was there an injury? No  Anticoag.  No   Any ALLERGIES ______________________________________________   Treatment:  Have you taken:  Tylenol  Yes  Advil Yes  Had PT No  Had injection No  Other  _________________________

## 2024-04-11 NOTE — Addendum Note (Signed)
 Addended by: DARRA MILLING R on: 04/11/2024 09:37 AM   Modules accepted: Orders
